# Patient Record
Sex: Female | Born: 1955
Health system: Southern US, Community
[De-identification: ages and names within clinical notes are randomized; demographics above are authoritative.]

## PROBLEM LIST (undated history)

## (undated) DIAGNOSIS — E119 Type 2 diabetes mellitus without complications: Secondary | ICD-10-CM

## (undated) DIAGNOSIS — E785 Hyperlipidemia, unspecified: Secondary | ICD-10-CM

## (undated) DIAGNOSIS — I10 Essential (primary) hypertension: Secondary | ICD-10-CM

## (undated) DIAGNOSIS — E669 Obesity, unspecified: Secondary | ICD-10-CM

## (undated) HISTORY — PX: ELBOW SURGERY: SHX618

## (undated) HISTORY — DX: Hyperlipidemia, unspecified: E78.5

## (undated) HISTORY — PX: HIP SURGERY: SHX245

## (undated) HISTORY — DX: Obesity, unspecified: E66.9

## (undated) HISTORY — DX: Essential (primary) hypertension: I10

## (undated) HISTORY — PX: ABDOMINAL HYSTERECTOMY: SHX81

## (undated) HISTORY — DX: Type 2 diabetes mellitus without complications: E11.9

---

## 2004-08-21 ENCOUNTER — Emergency Department: Payer: Self-pay | Admitting: Emergency Medicine

## 2004-09-05 ENCOUNTER — Other Ambulatory Visit: Payer: Self-pay

## 2004-09-13 ENCOUNTER — Ambulatory Visit: Payer: Self-pay | Admitting: Internal Medicine

## 2004-09-19 ENCOUNTER — Inpatient Hospital Stay: Payer: Self-pay | Admitting: Specialist

## 2004-10-26 ENCOUNTER — Encounter: Payer: Self-pay | Admitting: Specialist

## 2004-11-23 ENCOUNTER — Encounter: Payer: Self-pay | Admitting: Specialist

## 2007-10-24 LAB — HM COLONOSCOPY

## 2008-03-24 ENCOUNTER — Ambulatory Visit: Payer: Self-pay | Admitting: Obstetrics & Gynecology

## 2008-09-30 ENCOUNTER — Ambulatory Visit: Payer: Self-pay | Admitting: Unknown Physician Specialty

## 2009-01-06 ENCOUNTER — Ambulatory Visit: Payer: Self-pay | Admitting: Family Medicine

## 2009-06-22 ENCOUNTER — Ambulatory Visit: Payer: Self-pay | Admitting: Family Medicine

## 2010-10-14 ENCOUNTER — Ambulatory Visit: Payer: Self-pay | Admitting: Family Medicine

## 2010-11-07 DIAGNOSIS — J309 Allergic rhinitis, unspecified: Secondary | ICD-10-CM | POA: Insufficient documentation

## 2011-02-23 DIAGNOSIS — E11319 Type 2 diabetes mellitus with unspecified diabetic retinopathy without macular edema: Secondary | ICD-10-CM | POA: Insufficient documentation

## 2011-06-15 LAB — LIPID PANEL
CHOLESTEROL: 230 mg/dL — AB (ref 0–200)
HDL: 50 mg/dL (ref 35–70)
LDL Cholesterol: 157 mg/dL
TRIGLYCERIDES: 114 mg/dL (ref 40–160)

## 2011-08-25 ENCOUNTER — Ambulatory Visit: Payer: Self-pay | Admitting: Specialist

## 2011-08-25 DIAGNOSIS — E119 Type 2 diabetes mellitus without complications: Secondary | ICD-10-CM

## 2011-09-21 ENCOUNTER — Inpatient Hospital Stay: Payer: Self-pay | Admitting: Specialist

## 2012-04-12 ENCOUNTER — Ambulatory Visit: Payer: Self-pay | Admitting: Family Medicine

## 2013-05-28 ENCOUNTER — Ambulatory Visit: Payer: Self-pay | Admitting: Family Medicine

## 2014-12-16 ENCOUNTER — Ambulatory Visit: Payer: Self-pay | Admitting: Family Medicine

## 2015-04-23 LAB — HM DIABETES EYE EXAM

## 2015-05-12 ENCOUNTER — Encounter: Payer: Self-pay | Admitting: Family Medicine

## 2015-05-17 ENCOUNTER — Other Ambulatory Visit: Payer: Self-pay | Admitting: Family Medicine

## 2015-05-20 ENCOUNTER — Encounter: Payer: Self-pay | Admitting: Family Medicine

## 2015-05-20 ENCOUNTER — Ambulatory Visit (INDEPENDENT_AMBULATORY_CARE_PROVIDER_SITE_OTHER): Payer: 59 | Admitting: Family Medicine

## 2015-05-20 VITALS — BP 132/88 | HR 89 | Temp 97.7°F | Resp 16 | Ht 61.0 in | Wt 172.2 lb

## 2015-05-20 DIAGNOSIS — K635 Polyp of colon: Secondary | ICD-10-CM

## 2015-05-20 DIAGNOSIS — M19041 Primary osteoarthritis, right hand: Secondary | ICD-10-CM

## 2015-05-20 DIAGNOSIS — E785 Hyperlipidemia, unspecified: Secondary | ICD-10-CM | POA: Insufficient documentation

## 2015-05-20 DIAGNOSIS — E669 Obesity, unspecified: Secondary | ICD-10-CM

## 2015-05-20 DIAGNOSIS — Z1239 Encounter for other screening for malignant neoplasm of breast: Secondary | ICD-10-CM

## 2015-05-20 DIAGNOSIS — E1140 Type 2 diabetes mellitus with diabetic neuropathy, unspecified: Secondary | ICD-10-CM | POA: Diagnosis not present

## 2015-05-20 DIAGNOSIS — E66811 Obesity, class 1: Secondary | ICD-10-CM | POA: Insufficient documentation

## 2015-05-20 DIAGNOSIS — I10 Essential (primary) hypertension: Secondary | ICD-10-CM | POA: Diagnosis not present

## 2015-05-20 DIAGNOSIS — E1149 Type 2 diabetes mellitus with other diabetic neurological complication: Secondary | ICD-10-CM

## 2015-05-20 LAB — GLUCOSE, POCT (MANUAL RESULT ENTRY): POC Glucose: 140 mg/dl — AB (ref 70–99)

## 2015-05-20 LAB — POCT GLYCOSYLATED HEMOGLOBIN (HGB A1C): Hemoglobin A1C: 6.9

## 2015-05-20 MED ORDER — FENOFIBRATE MICRONIZED 134 MG PO CAPS: 134.0000 mg | ORAL_CAPSULE | Freq: Every day | ORAL | Status: DC

## 2015-05-20 MED ORDER — MELOXICAM 15 MG PO TABS: 15.0000 mg | ORAL_TABLET | Freq: Every day | ORAL | Status: DC

## 2015-05-20 MED ORDER — GLIMEPIRIDE 2 MG PO TABS: 2.0000 mg | ORAL_TABLET | Freq: Every day | ORAL | Status: DC

## 2015-05-20 MED ORDER — LISINOPRIL 5 MG PO TABS: 5.0000 mg | ORAL_TABLET | Freq: Every day | ORAL | Status: DC

## 2015-05-20 MED ORDER — WRIST/THUMB SPLINT/RIGHT MED MISC: 1.0000 | Status: DC

## 2015-05-20 NOTE — Patient Instructions (Signed)

## 2015-05-20 NOTE — Progress Notes (Signed)
Name: Brittany [name]   MRN: [id number]    DOB: 06-Sep-1956   Date:05/20/2015       Progress Note  Subjective  Chief Complaint  Chief Complaint  Patient presents with  . Hypertension  . Diabetes  . Hyperlipidemia  . Obesity    Hypertension This is a chronic problem. The current episode started more than 1 year ago. The problem is unchanged. The problem is controlled. Pertinent negatives include no blurred vision, chest pain, headaches, neck pain, orthopnea, palpitations or shortness of breath. There are no associated agents to hypertension. Risk factors for coronary artery disease include diabetes mellitus, dyslipidemia, sedentary lifestyle, stress, obesity and post-menopausal state. Past treatments include ACE inhibitors. The current treatment provides mild improvement. There are no compliance problems.   Diabetes She presents for her follow-up diabetic visit. She has type 2 diabetes mellitus. Her disease course has been improving. There are no hypoglycemic associated symptoms. Pertinent negatives for hypoglycemia include no dizziness, headaches, nervousness/anxiousness, seizures or tremors. Pertinent negatives for diabetes include no blurred vision, no chest pain, no weakness and no weight loss. Symptoms are improving. Diabetic complications include peripheral neuropathy. Risk factors for coronary artery disease include diabetes mellitus, dyslipidemia, hypertension, obesity, post-menopausal, sedentary lifestyle and stress. Current diabetic treatment includes oral agent (dual therapy). She is compliant with treatment most of the time. Her weight is increasing steadily. She is following a generally unhealthy diet. When asked about meal planning, she reported none. She rarely participates in exercise. Her overall blood glucose range is 130-140 mg/dl. An ACE inhibitor/angiotensin II receptor blocker is being taken.  Hyperlipidemia This is a chronic problem. The current episode started more than 1 year  ago. The problem is uncontrolled. Recent lipid tests were reviewed and are high. Exacerbating diseases include diabetes and obesity. There are no known factors aggravating her hyperlipidemia. Associated symptoms include myalgias. Pertinent negatives include no chest pain, focal weakness or shortness of breath. She is currently on no antihyperlipidemic treatment. The current treatment provides no improvement of lipids. Compliance problems include medication side effects.        Past Medical History  Diagnosis Date  . Diabetes mellitus without complication   . Hypertension   . Hyperlipidemia   . Obesity     Past Surgical History  Procedure Laterality Date  . Abdominal hysterectomy    . Hip surgery Right     twice  . Elbow surgery Left     Family History  Problem Relation Age of Onset  . Diabetes Mother   . Cancer Father   . Diabetes Sister   . Diabetes Brother     History   Social History  . Marital Status: Single    Spouse Name: N/A  . Number of Children: N/A  . Years of Education: N/A   Occupational History  . Not on file.   Social History Main Topics  . Smoking status: Never Smoker   . Smokeless tobacco: Not on file  . Alcohol Use: No  . Drug Use: No  . Sexual Activity: Not Currently   Other Topics Concern  . Not on file   Social History Narrative  . No narrative on file     Current outpatient prescriptions:  .  aspirin 81 MG tablet, Take 81 mg by mouth daily., Disp: , Rfl:  .  cetirizine (ZYRTEC) 10 MG tablet, Take 10 mg by mouth daily., Disp: , Rfl:  .  co-enzyme Q-10 30 MG capsule, Take 30 mg by mouth 3 (three) times  daily., Disp: , Rfl:  .  glimepiride (AMARYL) 2 MG tablet, TAKE 1 TABLET BY MOUTH ONCE DAILY, Disp: 90 tablet, Rfl: 1 .  lisinopril (PRINIVIL,ZESTRIL) 5 MG tablet, TAKE 1 TABLET EVERY DAY, Disp: 90 tablet, Rfl: 1 .  meloxicam (MOBIC) 15 MG tablet, TAKE 1 TABLET EVERY DAY, Disp: 90 tablet, Rfl: 1  No Known Allergies   Review of Systems   Constitutional: Negative for fever, chills and weight loss.  HENT: Negative for congestion, hearing loss, sore throat and tinnitus.   Eyes: Negative for blurred vision, double vision and redness.  Respiratory: Negative for cough, hemoptysis and shortness of breath.   Cardiovascular: Positive for leg swelling. Negative for chest pain, palpitations, orthopnea and claudication.  Gastrointestinal: Negative for heartburn, nausea, vomiting, diarrhea, constipation and blood in stool.  Genitourinary: Negative for dysuria, urgency, frequency and hematuria.  Musculoskeletal: Positive for myalgias and joint pain. Negative for back pain, falls and neck pain.  Skin: Negative for itching.  Neurological: Positive for tingling and sensory change. Negative for dizziness, tremors, focal weakness, seizures, loss of consciousness, weakness and headaches.  Endo/Heme/Allergies: Does not bruise/bleed easily.  Psychiatric/Behavioral: Negative for depression and substance abuse. The patient is not nervous/anxious and does not have insomnia.      Objective  Filed Vitals:   05/20/15 0913  BP: 132/88  Pulse: 89  Temp: 97.7 F (36.5 C)  Resp: 16  Height:  (1.549 m)  Weight: 172 lb 4 oz (78.132 kg)  SpO2: 96%    Physical Exam  Constitutional: She is oriented to person, place, and time and well-developed, well-nourished, and in no distress.  Obese  HENT:  Head: Normocephalic.  Eyes: EOM are normal. Pupils are equal, round, and reactive to light.  Neck: Normal range of motion. No thyromegaly present.  Cardiovascular: Normal rate, regular rhythm and normal heart sounds.   No murmur heard. Pulmonary/Chest: Effort normal and breath sounds normal.  Abdominal: Soft. Bowel sounds are normal.  Musculoskeletal: Normal range of motion. She exhibits no edema.  Neurological: She is alert and oriented to person, place, and time. No cranial nerve deficit. Gait normal.  Skin: Skin is warm and dry. No rash noted.   Psychiatric: Memory and affect normal.     Recent Results (from the past 2160 hour(s))  POCT Glucose (CBG)     Status: Abnormal   Collection Time: 05/20/15  9:20 AM  Result Value Ref Range   POC Glucose 140 (A) 70 - 99 mg/dl  POCT HgB Z6X     Status: None   Collection Time: 05/20/15  9:22 AM  Result Value Ref Range   Hemoglobin A1C 6.9      Assessment & Plan  1. Diabetes mellitus type 2 with neurological manifestations Offered but refused dietary consult - POCT HgB A1C - POCT Glucose (CBG) - glimepiride (AMARYL) 2 MG tablet; Take 1 tablet (2 mg total) by mouth daily.  Dispense: 90 tablet; Refill: 1  2. Essential hypertension Near goal - Comprehensive metabolic panel - TSH  3. Hyperlipemia Statin intolerant - fenofibrate micronized (LOFIBRA) 134 MG capsule; Take 1 capsule (134 mg total) by mouth daily before breakfast.  Dispense: 30 capsule; Refill: 3 - Lipid panel - lisinopril (PRINIVIL,ZESTRIL) 5 MG tablet; Take 1 tablet (5 mg total) by mouth daily.  Dispense: 90 tablet; Refill: 1  4. Obesity Dietary consult refused. Encourage exercise  5. Osteoarthritis of right hand, unspecified osteoarthritis type  - Elastic Bandages & Supports (WRIST/THUMB SPLINT/RIGHT MED) MISC; 1 Package by  Does not apply route as directed.  Dispense: 1 each; Refill: 0 - meloxicam (MOBIC) 15 MG tablet; Take 1 tablet (15 mg total) by mouth daily.  Dispense: 90 tablet; Refill: 1  6. Breast cancer screening  - MM Digital Diagnostic Bilat; Future  7. Colon polyps Due for repeat colonoscopy - Ambulatory referral to Gastroenterology

## 2015-05-21 DIAGNOSIS — M19049 Primary osteoarthritis, unspecified hand: Secondary | ICD-10-CM | POA: Insufficient documentation

## 2015-05-21 DIAGNOSIS — K635 Polyp of colon: Secondary | ICD-10-CM | POA: Insufficient documentation

## 2015-05-31 ENCOUNTER — Other Ambulatory Visit: Payer: Self-pay

## 2015-05-31 ENCOUNTER — Telehealth: Payer: Self-pay | Admitting: Gastroenterology

## 2015-05-31 NOTE — Telephone Encounter (Signed)
Gastroenterology Pre-Procedure Review  Request Date: 06-25-2015 Requesting Physician: Dr. Thana Ates  PATIENT REVIEW QUESTIONS: The patient responded to the following health history questions as indicated:    1. Are you having any GI issues? no 2. Do you have a personal history of Polyps? yes (polyps) 3. Do you have a family history of Colon Cancer or Polyps? no 4. Diabetes Mellitus? yes ( ) 5. Joint replacements in the past 12 months?no 6. Major health problems in the past 3 months?no 7. Any artificial heart valves, MVP, or defibrillator?no    MEDICATIONS & ALLERGIES:    Patient reports the following regarding taking any anticoagulation/antiplatelet therapy:   Plavix, Coumadin, Eliquis, Xarelto, Lovenox, Pradaxa, Brilinta, or Effient? no Aspirin? yes (DAily )  Patient confirms/reports the following medications:  Current Outpatient Prescriptions  Medication Sig Dispense Refill   aspirin 81 MG tablet Take 81 mg by mouth daily.     cetirizine (ZYRTEC) 10 MG tablet Take 10 mg by mouth daily.     co-enzyme Q-10 30 MG capsule Take 30 mg by mouth 3 (three) times daily.     Elastic Bandages & Supports (WRIST/THUMB SPLINT/RIGHT MED) MISC 1 Package by Does not apply route as directed. 1 each 0   fenofibrate micronized (LOFIBRA) 134 MG capsule Take 1 capsule (134 mg total) by mouth daily before breakfast. 30 capsule 3   glimepiride (AMARYL) 2 MG tablet Take 1 tablet (2 mg total) by mouth daily. 90 tablet 1   lisinopril (PRINIVIL,ZESTRIL) 5 MG tablet Take 1 tablet (5 mg total) by mouth daily. 90 tablet 1   meloxicam (MOBIC) 15 MG tablet Take 1 tablet (15 mg total) by mouth daily. 90 tablet 1   No current facility-administered medications for this visit.    Patient confirms/reports the following allergies:  No Known Allergies  No orders of the defined types were placed in this encounter.    AUTHORIZATION INFORMATION Primary Insurance: 1D#: Group #:  Secondary  Insurance: 1D#: Group #:  SCHEDULE INFORMATION: Date: 06-25-2015 Time: Location:[facility]

## 2015-06-16 ENCOUNTER — Ambulatory Visit (INDEPENDENT_AMBULATORY_CARE_PROVIDER_SITE_OTHER): Payer: 59 | Admitting: Family Medicine

## 2015-06-16 ENCOUNTER — Encounter: Payer: Self-pay | Admitting: Family Medicine

## 2015-06-16 VITALS — BP 120/80 | HR 103 | Temp 98.4°F | Resp 20 | Ht 61.0 in | Wt 173.1 lb

## 2015-06-16 DIAGNOSIS — M79605 Pain in left leg: Secondary | ICD-10-CM

## 2015-06-16 DIAGNOSIS — M79604 Pain in right leg: Secondary | ICD-10-CM | POA: Diagnosis not present

## 2015-06-16 MED ORDER — HYDROCODONE-ACETAMINOPHEN 5-325 MG PO TABS: 1.0000 | ORAL_TABLET | Freq: Three times a day (TID) | ORAL | Status: DC | PRN

## 2015-06-16 NOTE — Progress Notes (Signed)
Name: Brittany [name]   MRN: [id number]    DOB: [birth year]   Date:06/16/2015      Subjective  Chief Complaint  Chief Complaint  Patient presents with  . Acute Visit    Leg pain x2 months (Dr. Rutherford Nail Pt)     HPI  Pt. Presents with 2 month history of worsening bilateral posterior leg pain. Most recent episode was yesterday when she was loading a box in the truck at work and felt sharp pain and as if something was pulling at her muscles in the thigh. She fell down yesterday in her kitchen when her legs 'gave out' pain is worse on Tuesdays when she has to unload and load the truck. No numbness/tingling/weakness of her legs at present. Pt. Is s/p right THR x 2.  Past Medical History  Diagnosis Date  . Diabetes mellitus without complication   . Hypertension   . Hyperlipidemia   . Obesity     Past Surgical History  Procedure Laterality Date  . Abdominal hysterectomy    . Hip surgery Right     twice  . Elbow surgery Left     Family History  Problem Relation Age of Onset  . Diabetes Mother   . Cancer Father   . Diabetes Sister   . Diabetes Brother     Social History   Social History  . Marital Status: Single    Spouse Name: N/A  . Number of Children: N/A  . Years of Education: N/A   Occupational History  . Not on file.   Social History Main Topics  . Smoking status: Never Smoker   . Smokeless tobacco: Not on file  . Alcohol Use: No  . Drug Use: No  . Sexual Activity: Not Currently   Other Topics Concern  . Not on file   Social History Narrative     Current outpatient prescriptions:  .  aspirin 81 MG tablet, Take 81 mg by mouth daily., Disp: , Rfl:  .  cetirizine (ZYRTEC) 10 MG tablet, Take 10 mg by mouth daily., Disp: , Rfl:  .  co-enzyme Q-10 30 MG capsule, Take 30 mg by mouth 3 (three) times daily., Disp: , Rfl:  .  Elastic Bandages & Supports (WRIST/THUMB SPLINT/RIGHT MED) MISC, 1 Package by Does not apply route as directed., Disp: 1 each, Rfl: 0 .   fenofibrate micronized (LOFIBRA) 134 MG capsule, Take 1 capsule (134 mg total) by mouth daily before breakfast., Disp: 30 capsule, Rfl: 3 .  glimepiride (AMARYL) 2 MG tablet, Take 1 tablet (2 mg total) by mouth daily., Disp: 90 tablet, Rfl: 1 .  lisinopril (PRINIVIL,ZESTRIL) 5 MG tablet, Take 1 tablet (5 mg total) by mouth daily., Disp: 90 tablet, Rfl: 1 .  meloxicam (MOBIC) 15 MG tablet, Take 1 tablet (15 mg total) by mouth daily., Disp: 90 tablet, Rfl: 1 .  SUPREP BOWEL PREP SOLN, See admin instructions., Disp: , Rfl: 0  No Known Allergies   ROS  Constitutional: Negative for fever and chills.  Respiratory: Negative for cough and shortness of breath.   Cardiovascular: Negative for chest pain.  Musculoskeletal: Positive for myalgias, back pain and falls. Negative for joint pain.    Objective  Filed Vitals:   06/16/15 1448  BP: 120/80  Pulse: 103  Temp: 98.4 F (36.9 C)  TempSrc: Oral  Resp: 20  Height: 5' 1" (1.549 m)  Weight: 173 lb 1.6 oz (78.518 kg)  SpO2: 96%    Physical Exam  Musculoskeletal:  Right upper leg: She exhibits no tenderness, no swelling and no edema.       Left upper leg: She exhibits no tenderness, no bony tenderness and no swelling.  Tenderness over the right and left trochanteric bursa   Constitutional: She is oriented to person, place, and time and well-developed, well-nourished, and in no distress.  Cardiovascular: Normal rate and regular rhythm.   Pulmonary/Chest: Effort normal and breath sounds normal.  Neurological: She is alert and oriented to person, place, and time.  Skin: Skin is warm and dry.  Nursing note and vitals reviewed.   Assessment & Plan  1. Leg pain, bilateral Obtain labs and imaging for worsening bilateral leg pain.  Suspect trochanteric bursitis refractory to NSAID therapy. Patient advised to contact her orthopedist for consideration of steroid injection. Rx for Hydrocodone 5 mg every 8 hours as needed for 5 days is  provided.  - CK (Creatine Kinase) - Aldolase - CBC w/Diff/Platelet - Comprehensive metabolic panel - Sed Rate (ESR) - DG FEMUR MIN 2 VIEWS LEFT; Future - DG FEMUR, MIN 2 VIEWS RIGHT; Future - DG HIPS BILAT WITH PELVIS 3-4 VIEWS; Future  - HYDROcodone-acetaminophen (NORCO/VICODIN) 5-325 MG per tablet; Take 1 tablet by mouth every 8 (eight) hours as needed for moderate pain.  Dispense: 15 tablet; Refill: 0   Tkeya Stencil [facility] 06/16/2015 6:10 PM

## 2015-06-17 ENCOUNTER — Encounter: Payer: Self-pay | Admitting: *Deleted

## 2015-06-18 ENCOUNTER — Encounter: Payer: Self-pay | Admitting: Anesthesiology

## 2015-06-18 LAB — COMPREHENSIVE METABOLIC PANEL
ALBUMIN: 4.4 g/dL (ref 3.5–5.5)
ALK PHOS: 64 IU/L (ref 39–117)
ALT: 21 IU/L (ref 0–32)
AST: 17 IU/L (ref 0–40)
Albumin/Globulin Ratio: 1.7 (ref 1.1–2.5)
BILIRUBIN TOTAL: 0.5 mg/dL (ref 0.0–1.2)
BUN / CREAT RATIO: 33 — AB (ref 9–23)
BUN: 23 mg/dL (ref 6–24)
CHLORIDE: 103 mmol/L (ref 97–108)
CO2: 22 mmol/L (ref 18–29)
CREATININE: 0.7 mg/dL (ref 0.57–1.00)
Calcium: 9.4 mg/dL (ref 8.7–10.2)
GFR calc Af Amer: 110 mL/min/{1.73_m2} (ref >59)
GFR calc non Af Amer: 96 mL/min/{1.73_m2} (ref >59)
GLUCOSE: 122 mg/dL — AB (ref 65–99)
Globulin, Total: 2.6 g/dL (ref 1.5–4.5)
Potassium: 4.6 mmol/L (ref 3.5–5.2)
Sodium: 142 mmol/L (ref 134–144)
Total Protein: 7 g/dL (ref 6.0–8.5)

## 2015-06-18 LAB — CBC WITH DIFFERENTIAL/PLATELET
BASOS ABS: 0 10*3/uL (ref 0.0–0.2)
Basos: 1 %
EOS (ABSOLUTE): 0.2 10*3/uL (ref 0.0–0.4)
Eos: 4 %
HEMOGLOBIN: 14.5 g/dL (ref 11.1–15.9)
Hematocrit: 43.5 % (ref 34.0–46.6)
IMMATURE GRANS (ABS): 0 10*3/uL (ref 0.0–0.1)
Immature Granulocytes: 0 %
LYMPHS ABS: 2 10*3/uL (ref 0.7–3.1)
LYMPHS: 35 %
MCH: 29.4 pg (ref 26.6–33.0)
MCHC: 33.3 g/dL (ref 31.5–35.7)
MCV: 88 fL (ref 79–97)
MONOCYTES: 10 %
Monocytes Absolute: 0.6 10*3/uL (ref 0.1–0.9)
Neutrophils Absolute: 2.9 10*3/uL (ref 1.4–7.0)
Neutrophils: 50 %
PLATELETS: 247 10*3/uL (ref 150–379)
RBC: 4.93 x10E6/uL (ref 3.77–5.28)
RDW: 13.9 % (ref 12.3–15.4)
WBC: 5.8 10*3/uL (ref 3.4–10.8)

## 2015-06-18 LAB — SEDIMENTATION RATE: SED RATE: 6 mm/h (ref 0–40)

## 2015-06-18 LAB — CK: CK TOTAL: 67 U/L (ref 24–173)

## 2015-06-18 LAB — ALDOLASE: ALDOLASE: 4.3 U/L (ref 3.3–10.3)

## 2015-06-23 NOTE — Discharge Instructions (Signed)

## 2015-06-25 ENCOUNTER — Ambulatory Visit: Payer: 59 | Admitting: Anesthesiology

## 2015-06-25 ENCOUNTER — Ambulatory Visit
Admission: RE | Admit: 2015-06-25 | Discharge: 2015-06-25 | Disposition: A | Discharge status: Home / Self Care | Payer: 59 | Source: Ambulatory Visit | Attending: Gastroenterology | Admitting: Gastroenterology

## 2015-06-25 ENCOUNTER — Encounter
Admission: RE | Disposition: A | Discharge status: Home / Self Care | Payer: Self-pay | Source: Ambulatory Visit | Attending: Gastroenterology

## 2015-06-25 DIAGNOSIS — E119 Type 2 diabetes mellitus without complications: Secondary | ICD-10-CM | POA: Diagnosis not present

## 2015-06-25 DIAGNOSIS — I10 Essential (primary) hypertension: Secondary | ICD-10-CM | POA: Insufficient documentation

## 2015-06-25 DIAGNOSIS — Z7982 Long term (current) use of aspirin: Secondary | ICD-10-CM | POA: Insufficient documentation

## 2015-06-25 DIAGNOSIS — Z6831 Body mass index (BMI) 31.0-31.9, adult: Secondary | ICD-10-CM | POA: Diagnosis not present

## 2015-06-25 DIAGNOSIS — E785 Hyperlipidemia, unspecified: Secondary | ICD-10-CM | POA: Diagnosis not present

## 2015-06-25 DIAGNOSIS — Z79899 Other long term (current) drug therapy: Secondary | ICD-10-CM | POA: Insufficient documentation

## 2015-06-25 DIAGNOSIS — Z860100 Personal history of colon polyps, unspecified: Secondary | ICD-10-CM | POA: Insufficient documentation

## 2015-06-25 DIAGNOSIS — E669 Obesity, unspecified: Secondary | ICD-10-CM | POA: Insufficient documentation

## 2015-06-25 DIAGNOSIS — Z833 Family history of diabetes mellitus: Secondary | ICD-10-CM | POA: Diagnosis not present

## 2015-06-25 DIAGNOSIS — Z90710 Acquired absence of both cervix and uterus: Secondary | ICD-10-CM | POA: Diagnosis not present

## 2015-06-25 DIAGNOSIS — Z809 Family history of malignant neoplasm, unspecified: Secondary | ICD-10-CM | POA: Diagnosis not present

## 2015-06-25 DIAGNOSIS — K640 First degree hemorrhoids: Secondary | ICD-10-CM | POA: Insufficient documentation

## 2015-06-25 HISTORY — PX: COLONOSCOPY WITH PROPOFOL: SHX5780

## 2015-06-25 LAB — GLUCOSE, CAPILLARY: Glucose-Capillary: 185 mg/dL — ABNORMAL HIGH (ref 65–99)

## 2015-06-25 SURGERY — COLONOSCOPY WITH PROPOFOL
Anesthesia: Monitor Anesthesia Care | Wound class: Contaminated

## 2015-06-25 MED ORDER — SODIUM CHLORIDE 0.9 % IV SOLN: INTRAVENOUS | Status: DC

## 2015-06-25 MED ORDER — LIDOCAINE HCL (CARDIAC) 20 MG/ML IV SOLN
INTRAVENOUS | Status: DC | PRN
  Administered 2015-06-25: 30 mg via INTRAVENOUS

## 2015-06-25 MED ORDER — LACTATED RINGERS IV SOLN
INTRAVENOUS | Status: DC
  Administered 2015-06-25 (×2): via INTRAVENOUS

## 2015-06-25 MED ORDER — STERILE WATER FOR IRRIGATION IR SOLN
Status: DC | PRN
  Administered 2015-06-25: 08:00:00

## 2015-06-25 MED ORDER — PROPOFOL 10 MG/ML IV BOLUS
INTRAVENOUS | Status: DC | PRN
  Administered 2015-06-25: 10 mg via INTRAVENOUS
  Administered 2015-06-25: 50 mg via INTRAVENOUS
  Administered 2015-06-25: 10 mg via INTRAVENOUS
  Administered 2015-06-25 (×2): 20 mg via INTRAVENOUS
  Administered 2015-06-25: 100 mg via INTRAVENOUS
  Administered 2015-06-25: 40 mg via INTRAVENOUS

## 2015-06-25 SURGICAL SUPPLY — 28 items

## 2015-06-25 NOTE — Op Note (Signed)
[facility] Patient Name: Maylen Waltermire Procedure Date: 06/25/2015 8:06 AM MRN: [id number] Account #: [id number] Date of Birth: 06/17/1956 Admit Type: Outpatient Age: 59 Room: [facility] OR ROOM 01 Gender: Female Note Status: Finalized Procedure:         Colonoscopy Indications:       High risk colon cancer surveillance: Personal history of                     colonic polyps Providers:         Midge Minium, MD Referring MD:      Dennison Mascot, MD (Referring MD) Medicines:         Propofol per Anesthesia Complications:     No immediate complications. Procedure:         Pre-Anesthesia Assessment:                    - Prior to the procedure, a History and Physical was                     performed, and patient medications and allergies were                     reviewed. The patient's tolerance of previous anesthesia                     was also reviewed. The risks and benefits of the procedure                     and the sedation options and risks were discussed with the                     patient. All questions were answered, and informed consent                     was obtained. Prior Anticoagulants: The patient has taken                     no previous anticoagulant or antiplatelet agents. ASA                     Grade Assessment: II - A patient with mild systemic                     disease. After reviewing the risks and benefits, the                     patient was deemed in satisfactory condition to undergo                     the procedure.                    After obtaining informed consent, the colonoscope was                     passed under direct vision. Throughout the procedure, the                     patient's blood pressure, pulse, and oxygen saturations                     were monitored continuously. The Olympus CF H180AL  colonoscope (S#: [id number]) was introduced through the anus                     and advanced to the  the cecum, identified by appendiceal                     orifice and ileocecal valve. The colonoscopy was performed                     without difficulty. The patient tolerated the procedure                     well. The quality of the bowel preparation was excellent. Findings:      The perianal and digital rectal examinations were normal.      Non-bleeding internal hemorrhoids were found during retroflexion. The       hemorrhoids were Grade I (internal hemorrhoids that do not prolapse). Impression:        - Non-bleeding internal hemorrhoids.                    - No specimens collected. Recommendation:    - Repeat colonoscopy in 5 years for surveillance. Procedure Code(s): --- Professional ---                    [phone], Colonoscopy, flexible; diagnostic, including                     collection of specimen(s) by brushing or washing, when                     performed (separate procedure) Diagnosis Code(s): --- Professional ---                    Z86.010, Personal history of colonic polyps CPT copyright 2014 American Medical Association. All rights reserved. The codes documented in this report are preliminary and upon coder review may  be revised to meet current compliance requirements. Midge Minium, MD 06/25/2015 8:27:47 AM This report has been signed electronically. Number of Addenda: 0 Note Initiated On: 06/25/2015 8:06 AM Scope Withdrawal Time: 0 hours 6 minutes 18 seconds  Total Procedure Duration: 0 hours 9 minutes 19 seconds       [facility]

## 2015-06-25 NOTE — Anesthesia Preprocedure Evaluation (Signed)
Anesthesia Evaluation  Patient identified by MRN, date of birth, ID band Patient awake    Reviewed: Allergy & Precautions, NPO status , Patient's Chart, lab work & pertinent test results  Airway Mallampati: II  TM Distance: >3 FB Neck ROM: Full    Dental no notable dental hx.    Pulmonary neg pulmonary ROS,  breath sounds clear to auscultation  Pulmonary exam normal       Cardiovascular hypertension, negative cardio ROS Normal cardiovascular examRhythm:Regular Rate:Normal     Neuro/Psych negative neurological ROS  negative psych ROS   GI/Hepatic negative GI ROS, Neg liver ROS,   Endo/Other  negative endocrine ROSdiabetes  Renal/GU negative Renal ROS  negative genitourinary   Musculoskeletal negative musculoskeletal ROS (+)   Abdominal   Peds negative pediatric ROS (+)  Hematology negative hematology ROS (+)   Anesthesia Other Findings   Reproductive/Obstetrics negative OB ROS                             Anesthesia Physical Anesthesia Plan  ASA: II  Anesthesia Plan: MAC   Post-op Pain Management:    Induction: Intravenous  Airway Management Planned:   Additional Equipment:   Intra-op Plan:   Post-operative Plan: Extubation in OR  Informed Consent: I have reviewed the patients History and Physical, chart, labs and discussed the procedure including the risks, benefits and alternatives for the proposed anesthesia with the patient or authorized representative who has indicated his/her understanding and acceptance.   Dental advisory given  Plan Discussed with: CRNA  Anesthesia Plan Comments:         Anesthesia Quick Evaluation

## 2015-06-25 NOTE — Transfer of Care (Signed)
Immediate Anesthesia Transfer of Care Note  Patient: Brittany [name]  Procedure(s) Performed: Procedure(s) with comments: COLONOSCOPY WITH PROPOFOL (N/A) - Diabetic - oral meds  Patient Location: PACU  Anesthesia Type: MAC  Level of Consciousness: awake, alert  and patient cooperative  Airway and Oxygen Therapy: Patient Spontanous Breathing and Patient connected to supplemental oxygen  Post-op Assessment: Post-op Vital signs reviewed, Patient's Cardiovascular Status Stable, Respiratory Function Stable, Patent Airway and No signs of Nausea or vomiting  Post-op Vital Signs: Reviewed and stable  Complications: No apparent anesthesia complications

## 2015-06-25 NOTE — H&P (Signed)
[facility]  [address] [place], [place] [address] Phone: [phone] Fax : [phone]  Primary Care Physician:  Dennison Mascot, MD Primary Gastroenterologist:  Dr. Servando Snare  Pre-Procedure History & Physical: HPI:  Brittany [name] is a 59 y.o. female is here for an colonoscopy.   Past Medical History  Diagnosis Date  . Diabetes mellitus without complication   . Hyperlipidemia   . Obesity   . Hypertension     pt states on medication as prevenative    Past Surgical History  Procedure Laterality Date  . Abdominal hysterectomy    . Hip surgery Right     twice  . Elbow surgery Left     Prior to Admission medications   Medication Sig Start Date End Date Taking? Authorizing Provider  aspirin 81 MG tablet Take 81 mg by mouth daily.   Yes Historical Provider, MD  cetirizine (ZYRTEC) 10 MG tablet Take 10 mg by mouth daily.   Yes Historical Provider, MD  co-enzyme Q-10 30 MG capsule Take 30 mg by mouth at bedtime.    Yes Historical Provider, MD  fenofibrate micronized (LOFIBRA) 134 MG capsule Take 1 capsule (134 mg total) by mouth daily before breakfast. 05/20/15  Yes Dennison Mascot, MD  glimepiride (AMARYL) 2 MG tablet Take 1 tablet (2 mg total) by mouth daily. 05/20/15  Yes Dennison Mascot, MD  HYDROcodone-acetaminophen (NORCO/VICODIN) 5-325 MG per tablet Take 1 tablet by mouth every 8 (eight) hours as needed for moderate pain. 06/16/15  Yes Ellyn Hack, MD  lisinopril (PRINIVIL,ZESTRIL) 5 MG tablet Take 1 tablet (5 mg total) by mouth daily. 05/20/15  Yes Dennison Mascot, MD  meloxicam (MOBIC) 15 MG tablet Take 1 tablet (15 mg total) by mouth daily. 05/20/15  Yes Dennison Mascot, MD  SUPREP BOWEL PREP SOLN See admin instructions. 06/08/15  Yes Historical Provider, MD  Elastic Bandages & Supports (WRIST/THUMB SPLINT/RIGHT MED) MISC 1 Package by Does not apply route as directed. 05/20/15   Dennison Mascot, MD    Allergies as of 05/31/2015  . (No Known  Allergies)    Family History  Problem Relation Age of Onset  . Diabetes Mother   . Cancer Father   . Diabetes Sister   . Diabetes Brother     Social History   Social History  . Marital Status: Single    Spouse Name: N/A  . Number of Children: N/A  . Years of Education: N/A   Occupational History  . Not on file.   Social History Main Topics  . Smoking status: Never Smoker   . Smokeless tobacco: Never Used  . Alcohol Use: 0.0 oz/week    0 Standard drinks or equivalent per week     Comment: 3 times a year  . Drug Use: No  . Sexual Activity: Not Currently   Other Topics Concern  . Not on file   Social History Narrative    Review of Systems: See HPI, otherwise negative ROS  Physical Exam: BP 120/86 mmHg  Pulse 80  Temp(Src) 97 F (36.1 C) (Temporal)  Resp 16  Ht  (1.549 m)  Wt 166 lb (75.297 kg)  BMI 31.38 kg/m2  SpO2 98% General:   Alert,  pleasant and cooperative in NAD Head:  Normocephalic and atraumatic. Neck:  Supple; no masses or thyromegaly. Lungs:  Clear throughout to auscultation.    Heart:  Regular rate and rhythm. Abdomen:  Soft, nontender and nondistended. Normal bowel sounds, without guarding, and without rebound.  Neurologic:  Alert and  oriented x4;  grossly normal neurologically.  Impression/Plan: Brittany [name] is here for an colonoscopy to be performed for history of polyps  Risks, benefits, limitations, and alternatives regarding  colonoscopy have been reviewed with the patient.  Questions have been answered.  All parties agreeable.   Darlina Rumpf, MD  06/25/2015, 8:06 AM

## 2015-06-25 NOTE — Anesthesia Postprocedure Evaluation (Signed)
  Anesthesia Post-op Note  Patient: Brittany [name]  Procedure(s) Performed: Procedure(s) with comments: COLONOSCOPY WITH PROPOFOL (N/A) - Diabetic - oral meds  Anesthesia type:MAC  Patient location: PACU  Post pain: Pain level controlled  Post assessment: Post-op Vital signs reviewed, Patient's Cardiovascular Status Stable, Respiratory Function Stable, Patent Airway and No signs of Nausea or vomiting  Post vital signs: Reviewed and stable  Last Vitals:  Filed Vitals:   06/25/15 0849  BP:   Pulse: 80  Temp:   Resp: 13    Level of consciousness: awake, alert  and patient cooperative  Complications: No apparent anesthesia complications

## 2015-06-25 NOTE — Anesthesia Procedure Notes (Signed)
Procedure Name: MAC Performed by: Emiliya Chretien Pre-anesthesia Checklist: Patient identified, Emergency Drugs available, Suction available, Patient being monitored and Timeout performed Patient Re-evaluated:Patient Re-evaluated prior to inductionOxygen Delivery Method: Nasal cannula Preoxygenation: Pre-oxygenation with 100% oxygen       

## 2015-06-29 ENCOUNTER — Encounter: Payer: Self-pay | Admitting: Gastroenterology

## 2015-07-23 ENCOUNTER — Other Ambulatory Visit: Payer: Self-pay

## 2015-07-23 DIAGNOSIS — E785 Hyperlipidemia, unspecified: Secondary | ICD-10-CM

## 2015-07-23 MED ORDER — FENOFIBRATE MICRONIZED 134 MG PO CAPS: 134.0000 mg | ORAL_CAPSULE | Freq: Every day | ORAL | Status: DC

## 2015-10-28 ENCOUNTER — Other Ambulatory Visit: Payer: Self-pay | Admitting: Family Medicine

## 2015-12-02 ENCOUNTER — Ambulatory Visit (INDEPENDENT_AMBULATORY_CARE_PROVIDER_SITE_OTHER): Payer: 59 | Admitting: Family Medicine

## 2015-12-02 ENCOUNTER — Encounter: Payer: Self-pay | Admitting: Family Medicine

## 2015-12-02 VITALS — BP 130/74 | HR 86 | Temp 98.1°F | Resp 14 | Ht 61.0 in | Wt 175.7 lb

## 2015-12-02 DIAGNOSIS — M6530 Trigger finger, unspecified finger: Secondary | ICD-10-CM | POA: Insufficient documentation

## 2015-12-02 DIAGNOSIS — G629 Polyneuropathy, unspecified: Secondary | ICD-10-CM | POA: Insufficient documentation

## 2015-12-02 DIAGNOSIS — Z23 Encounter for immunization: Secondary | ICD-10-CM | POA: Diagnosis not present

## 2015-12-02 DIAGNOSIS — Z1239 Encounter for other screening for malignant neoplasm of breast: Secondary | ICD-10-CM | POA: Diagnosis not present

## 2015-12-02 DIAGNOSIS — M1990 Unspecified osteoarthritis, unspecified site: Secondary | ICD-10-CM | POA: Insufficient documentation

## 2015-12-02 DIAGNOSIS — M19041 Primary osteoarthritis, right hand: Secondary | ICD-10-CM

## 2015-12-02 DIAGNOSIS — E785 Hyperlipidemia, unspecified: Secondary | ICD-10-CM

## 2015-12-02 DIAGNOSIS — G64 Other disorders of peripheral nervous system: Secondary | ICD-10-CM | POA: Insufficient documentation

## 2015-12-02 DIAGNOSIS — K219 Gastro-esophageal reflux disease without esophagitis: Secondary | ICD-10-CM | POA: Insufficient documentation

## 2015-12-02 DIAGNOSIS — E1140 Type 2 diabetes mellitus with diabetic neuropathy, unspecified: Secondary | ICD-10-CM | POA: Diagnosis not present

## 2015-12-02 DIAGNOSIS — K2100 Gastro-esophageal reflux disease with esophagitis, without bleeding: Secondary | ICD-10-CM

## 2015-12-02 DIAGNOSIS — I10 Essential (primary) hypertension: Secondary | ICD-10-CM

## 2015-12-02 LAB — POCT GLYCOSYLATED HEMOGLOBIN (HGB A1C): HEMOGLOBIN A1C: 8.2

## 2015-12-02 LAB — POCT UA - MICROALBUMIN: Microalbumin Ur, POC: NEGATIVE mg/L

## 2015-12-02 MED ORDER — MELOXICAM 15 MG PO TABS: 15.0000 mg | ORAL_TABLET | Freq: Every day | ORAL | Status: DC

## 2015-12-02 MED ORDER — OMEPRAZOLE 40 MG PO CPDR: 40.0000 mg | DELAYED_RELEASE_CAPSULE | Freq: Every day | ORAL | Status: DC

## 2015-12-02 MED ORDER — FENOFIBRATE MICRONIZED 134 MG PO CAPS: ORAL_CAPSULE | ORAL | Status: DC

## 2015-12-02 MED ORDER — GLIPIZIDE ER 5 MG PO TB24: 5.0000 mg | ORAL_TABLET | Freq: Every day | ORAL | Status: DC

## 2015-12-02 MED ORDER — LISINOPRIL 5 MG PO TABS: 5.0000 mg | ORAL_TABLET | Freq: Every day | ORAL | Status: DC

## 2015-12-02 NOTE — Progress Notes (Signed)
Name: Brittany [name]   MRN: [id number]    DOB: 01/21/1956   Date:12/02/2015       Progress Note  Subjective  Chief Complaint  Chief Complaint  Patient presents with  . Medication Refill  . Diabetes    Does not check sugar at home.  Marland Kitchen Hypertension  . Gastroesophageal Reflux    Trouble with spicy foods    HPI  DMII: she has diabetic neuropathy - with pain on both legs  - she tried gabapentin but it did not help. She is currently taking Amaryl  she has not been checking fsbs at home and has not been compliant with her diet lately. She has noticed some blurred vision but no polydipsia, polyuria or polyphagia. She states last report from her eye doctor was normal but she has a history of right diabetic retinopathy. She takes an aspirin daily and lisinopril for kidney protection  Hyperlipidemia: she has been taking Fenofibrate, she states statin therapy causes myalgias. No chest pain.  HTN: she has not been checking her bp at home, it is at goal today, no side effects of medication, no chest pain or palpitation, no SOB  GERD: taking Omeprazole daily, symptoms usually triggered by spicy food. Sometimes she is able to skip a few doses.   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   Patient Active Problem List   Diagnosis Date Noted  . Acquired trigger finger 12/02/2015  . Arthritis, degenerative 12/02/2015  . Disorder of peripheral nervous system (HCC) 12/02/2015  . Reflux esophagitis 12/02/2015  . Diabetic neuropathy, type II diabetes mellitus (HCC) 12/02/2015  . Hx of colonic polyps   . Degenerative joint disease of hand 05/21/2015  . Colon polyps 05/21/2015  . Essential hypertension 05/20/2015  . Hyperlipemia 05/20/2015  . Obesity 05/20/2015  . Diabetic retinopathy associated with type 2 diabetes mellitus (HCC) 02/23/2011  . Allergic rhinitis 11/07/2010    Past Surgical History   Procedure Laterality Date  . Abdominal hysterectomy    . Hip surgery Right     twice  . Elbow surgery Left   . Colonoscopy with propofol N/A 06/25/2015    Procedure: COLONOSCOPY WITH PROPOFOL;  Surgeon: Midge Minium, MD;  Location: [facility];  Service: Endoscopy;  Laterality: N/A;  Diabetic - oral meds    Family History  Problem Relation Age of Onset  . Diabetes Mother   . Cancer Father   . Diabetes Sister   . Diabetes Brother     Social History   Social History  . Marital Status: Single    Spouse Name: N/A  . Number of Children: N/A  . Years of Education: N/A   Occupational History  . Not on file.   Social History Main Topics  . Smoking status: Never Smoker   . Smokeless tobacco: Never Used  . Alcohol Use: 0.0 oz/week    0 Standard drinks or equivalent per week     Comment: 1 times a year-wine  . Drug Use: No  . Sexual Activity: Not Currently   Other Topics Concern  . Not on file   Social History Narrative     Current outpatient prescriptions:  .  aspirin 81 MG tablet, Take 81 mg by mouth daily., Disp: , Rfl:  .  cetirizine (ZYRTEC) 10 MG tablet, Take 10 mg by mouth daily., Disp: , Rfl:  .  co-enzyme Q-10  30 MG capsule, Take 30 mg by mouth at bedtime. , Disp: , Rfl:  .  fenofibrate micronized (LOFIBRA) 134 MG capsule, TAKE 1 CAPSULE (134 MG TOTAL) BY MOUTH DAILY BEFORE BREAKFAST., Disp: 90 capsule, Rfl: 1 .  GLUCOSE BLOOD VI, , Disp: , Rfl:  .  lisinopril (PRINIVIL,ZESTRIL) 5 MG tablet, Take 1 tablet (5 mg total) by mouth daily., Disp: 90 tablet, Rfl: 1 .  meloxicam (MOBIC) 15 MG tablet, Take 1 tablet (15 mg total) by mouth daily., Disp: 90 tablet, Rfl: 1 .  omeprazole (PRILOSEC) 40 MG capsule, Take 1 capsule (40 mg total) by mouth daily., Disp: 90 capsule, Rfl: 1 .  glipiZIDE (GLUCOTROL XL) 5 MG 24 hr tablet, Take 1 tablet (5 mg total) by mouth daily with breakfast., Disp: 90 tablet, Rfl: 0  Allergies  Allergen Reactions  . Atorvastatin     myalgia   . Metformin & Diet Manage Prod   . Rosuvastatin     MUSCLE ACHES     ROS  Constitutional: Negative for fever or weight change.  Respiratory: Negative for cough and shortness of breath.   Cardiovascular: Negative for chest pain or palpitations.  Gastrointestinal: Negative for abdominal pain, no bowel changes.  Musculoskeletal: Negative for gait problem or joint swelling. She has left middle finger that triggers Skin: Negative for rash.  Neurological: Negative for dizziness or headache.  No other specific complaints in a complete review of systems (except as listed in HPI above).  Objective  Filed Vitals:   12/02/15 0845  BP: 130/74  Pulse: 86  Temp: 98.1 F (36.7 C)  TempSrc: Oral  Resp: 14  Height:  (1.549 m)  Weight: 175 lb 11.2 oz (79.697 kg)  SpO2: 97%    Body mass index is 33.22 kg/(m^2).  Physical Exam  Constitutional: Patient appears well-developed and well-nourished. Obese No distress.  HEENT: head atraumatic, normocephalic, pupils equal and reactive to light,  neck supple, throat within normal limits Cardiovascular: Normal rate, regular rhythm and normal heart sounds.  No murmur heard. No BLE edema. Pulmonary/Chest: Effort normal and breath sounds normal. No respiratory distress. Abdominal: Soft.  There is no tenderness. Psychiatric: Patient has a normal mood and affect. behavior is normal. Judgment and thought content normal.  Recent Results (from the past 2160 hour(s))  POCT HgB A1C     Status: Abnormal   Collection Time: 12/02/15  8:48 AM  Result Value Ref Range   Hemoglobin A1C 8.2   POCT UA - Microalbumin     Status: Normal   Collection Time: 12/02/15  8:48 AM  Result Value Ref Range   Microalbumin Ur, POC negative mg/L   Creatinine, POC  mg/dL   Albumin/Creatinine Ratio, Urine, POC       PHQ2/9: Depression screen [facility] 2/9 12/02/2015 06/16/2015 05/20/2015  Decreased Interest 0 0 0  Down, Depressed, Hopeless 0 0 0  PHQ - 2 Score 0 0 0      Fall Risk: Fall Risk  12/02/2015 06/16/2015 05/20/2015  Falls in the past year? Yes No No  Number falls in past yr: 2 or more - -  Injury with Fall? No - -      Functional Status Survey: Is the patient deaf or have difficulty hearing?: No Does the patient have difficulty seeing, even when wearing glasses/contacts?: No Does the patient have difficulty concentrating, remembering, or making decisions?: No Does the patient have difficulty walking or climbing stairs?: No Does the patient have difficulty dressing or bathing?: No Does  the patient have difficulty doing errands alone such as visiting a doctor's office or shopping?: No   Assessment & Plan  1. Type 2 diabetes mellitus with diabetic neuropathy, without long-term current use of insulin (HCC)  We will change from Amaryl to Glipizide, advised to monitor glucose fasting and post-prandially  - POCT HgB A1C - POCT UA - Microalbumin - Pneumococcal conjugate vaccine 13-valent IM - glipiZIDE (GLUCOTROL XL) 5 MG 24 hr tablet; Take 1 tablet (5 mg total) by mouth daily with breakfast.  Dispense: 90 tablet; Refill: 0   2. Breast cancer screening  - MM Digital Screening; Future  3. Hyperlipemia  - Lipid panel - fenofibrate micronized (LOFIBRA) 134 MG capsule; TAKE 1 CAPSULE (134 MG TOTAL) BY MOUTH DAILY BEFORE BREAKFAST.  Dispense: 90 capsule; Refill: 1  4. Essential hypertension  At goal, continue medication  - Comprehensive metabolic panel - lisinopril (PRINIVIL,ZESTRIL) 5 MG tablet; Take 1 tablet (5 mg total) by mouth daily.  Dispense: 90 tablet; Refill: 1  5. Reflux esophagitis  Advised to try weaning self off - omeprazole (PRILOSEC) 40 MG capsule; Take 1 capsule (40 mg total) by mouth daily.  Dispense: 90 capsule; Refill: 1  6. Osteoarthritis of right hand, unspecified osteoarthritis type  Explained nsaid's long term use can cause kidney problems - try Tylenol when possible - meloxicam (MOBIC) 15 MG tablet; Take 1  tablet (15 mg total) by mouth daily.  Dispense: 90 tablet; Refill: 1

## 2015-12-03 LAB — LIPID PANEL
Chol/HDL Ratio: 3.8 ratio units (ref 0.0–4.4)
Cholesterol, Total: 190 mg/dL (ref 100–199)
HDL: 50 mg/dL (ref >39)
LDL Calculated: 121 mg/dL — ABNORMAL HIGH (ref 0–99)
TRIGLYCERIDES: 94 mg/dL (ref 0–149)
VLDL CHOLESTEROL CAL: 19 mg/dL (ref 5–40)

## 2015-12-03 LAB — COMPREHENSIVE METABOLIC PANEL
ALT: 21 IU/L (ref 0–32)
AST: 18 IU/L (ref 0–40)
Albumin/Globulin Ratio: 1.7 (ref 1.1–2.5)
Albumin: 4.5 g/dL (ref 3.5–5.5)
Alkaline Phosphatase: 61 IU/L (ref 39–117)
BILIRUBIN TOTAL: 0.5 mg/dL (ref 0.0–1.2)
BUN/Creatinine Ratio: 21 (ref 9–23)
BUN: 15 mg/dL (ref 6–24)
CALCIUM: 9.3 mg/dL (ref 8.7–10.2)
CHLORIDE: 102 mmol/L (ref 96–106)
CO2: 21 mmol/L (ref 18–29)
Creatinine, Ser: 0.72 mg/dL (ref 0.57–1.00)
GFR, EST AFRICAN AMERICAN: 107 mL/min/{1.73_m2} (ref >59)
GFR, EST NON AFRICAN AMERICAN: 93 mL/min/{1.73_m2} (ref >59)
GLOBULIN, TOTAL: 2.6 g/dL (ref 1.5–4.5)
Glucose: 140 mg/dL — ABNORMAL HIGH (ref 65–99)
Potassium: 4.8 mmol/L (ref 3.5–5.2)
SODIUM: 140 mmol/L (ref 134–144)
TOTAL PROTEIN: 7.1 g/dL (ref 6.0–8.5)

## 2016-02-28 ENCOUNTER — Other Ambulatory Visit: Payer: Self-pay | Admitting: Family Medicine

## 2016-02-29 ENCOUNTER — Ambulatory Visit: Payer: 59 | Admitting: Family Medicine

## 2016-04-13 ENCOUNTER — Encounter: Payer: Self-pay | Admitting: Family Medicine

## 2016-04-13 ENCOUNTER — Ambulatory Visit (INDEPENDENT_AMBULATORY_CARE_PROVIDER_SITE_OTHER): Payer: 59 | Admitting: Family Medicine

## 2016-04-13 VITALS — BP 124/74 | HR 83 | Temp 98.2°F | Resp 18 | Ht 61.0 in | Wt 170.1 lb

## 2016-04-13 DIAGNOSIS — IMO0002 Reserved for concepts with insufficient information to code with codable children: Secondary | ICD-10-CM

## 2016-04-13 DIAGNOSIS — K2100 Gastro-esophageal reflux disease with esophagitis, without bleeding: Secondary | ICD-10-CM

## 2016-04-13 DIAGNOSIS — E1140 Type 2 diabetes mellitus with diabetic neuropathy, unspecified: Secondary | ICD-10-CM

## 2016-04-13 DIAGNOSIS — E118 Type 2 diabetes mellitus with unspecified complications: Secondary | ICD-10-CM | POA: Insufficient documentation

## 2016-04-13 DIAGNOSIS — I10 Essential (primary) hypertension: Secondary | ICD-10-CM

## 2016-04-13 DIAGNOSIS — E785 Hyperlipidemia, unspecified: Secondary | ICD-10-CM

## 2016-04-13 DIAGNOSIS — E1165 Type 2 diabetes mellitus with hyperglycemia: Secondary | ICD-10-CM | POA: Diagnosis not present

## 2016-04-13 DIAGNOSIS — E669 Obesity, unspecified: Secondary | ICD-10-CM

## 2016-04-13 DIAGNOSIS — E559 Vitamin D deficiency, unspecified: Secondary | ICD-10-CM

## 2016-04-13 LAB — BASIC METABOLIC PANEL
BUN: 21 mg/dL (ref 7–25)
CHLORIDE: 106 mmol/L (ref 98–110)
CO2: 24 mmol/L (ref 20–31)
CREATININE: 0.78 mg/dL (ref 0.50–1.05)
Calcium: 9.3 mg/dL (ref 8.6–10.4)
GLUCOSE: 170 mg/dL — AB (ref 65–99)
POTASSIUM: 4.8 mmol/L (ref 3.5–5.3)
Sodium: 141 mmol/L (ref 135–146)

## 2016-04-13 LAB — POCT GLYCOSYLATED HEMOGLOBIN (HGB A1C): HEMOGLOBIN A1C: 8.1

## 2016-04-13 LAB — VITAMIN B12: Vitamin B-12: 485 pg/mL (ref 200–1100)

## 2016-04-13 LAB — TSH: TSH: 0.4 mIU/L

## 2016-04-13 LAB — GLUCOSE, POCT (MANUAL RESULT ENTRY): POC Glucose: 160 mg/dl — AB (ref 70–99)

## 2016-04-13 MED ORDER — GLIMEPIRIDE 4 MG PO TABS: 4.0000 mg | ORAL_TABLET | Freq: Every day | ORAL | Status: DC

## 2016-04-13 MED ORDER — NORTRIPTYLINE HCL 25 MG PO CAPS: 25.0000 mg | ORAL_CAPSULE | Freq: Every day | ORAL | Status: DC

## 2016-04-13 MED ORDER — OMEPRAZOLE 40 MG PO CPDR: 40.0000 mg | DELAYED_RELEASE_CAPSULE | Freq: Every day | ORAL | Status: DC | PRN

## 2016-04-13 NOTE — Progress Notes (Signed)
Date:  04/13/2016   Name:  Brittany [name]   DOB:  July 10, 1956   MRN:  [id number]  PCP:  Dennison MascotLemont Morrisey, MD    Chief Complaint: Diabetes and Leg Pain   History of Present Illness:  This is a 60 y.o. female seen in 4 month f/u. T2DM with retinopathy, intolerant metformin/glipizide/statins. C/o BLE pain L > R especially at night, intolerant gabapentin, does like Lyrica se's but has not tried. Takes omeprazole prn only. On vit D supp in past. Takes Mobic daily for hip pain.  Review of Systems:  Review of Systems  Constitutional: Negative for fever and fatigue.  Respiratory: Negative for cough and shortness of breath.   Cardiovascular: Negative for chest pain and leg swelling.  Endocrine: Negative for polyuria.  Genitourinary: Negative for difficulty urinating.  Neurological: Negative for syncope and light-headedness.    Patient Active Problem List   Diagnosis Date Noted  . Type 2 diabetes, uncontrolled, with neuropathy (HCC) 04/13/2016  . Vitamin D deficiency 04/13/2016  . Acquired trigger finger 12/02/2015  . Arthritis, degenerative 12/02/2015  . Disorder of peripheral nervous system (HCC) 12/02/2015  . Reflux esophagitis 12/02/2015  . Hx of colonic polyps   . Degenerative joint disease of hand 05/21/2015  . Colon polyps 05/21/2015  . Essential hypertension 05/20/2015  . Hyperlipemia 05/20/2015  . Obesity, Class I, BMI 30-34.9 05/20/2015  . Diabetic retinopathy associated with type 2 diabetes mellitus (HCC) 02/23/2011  . Allergic rhinitis 11/07/2010    Prior to Admission medications   Medication Sig Start Date End Date Taking? Authorizing Provider  glimepiride (AMARYL) 4 MG tablet Take 1 tablet (4 mg total) by mouth daily with breakfast. 04/13/16  Yes Schuyler AmorWilliam Aalijah Lanphere, MD  aspirin 81 MG tablet Take 81 mg by mouth daily.    Historical Provider, MD  co-enzyme Q-10 30 MG capsule Take 30 mg by mouth at bedtime.     Historical Provider, MD  fenofibrate micronized (LOFIBRA) 134 MG  capsule TAKE 1 CAPSULE (134 MG TOTAL) BY MOUTH DAILY BEFORE BREAKFAST. 12/02/15   Alba CoryKrichna Sowles, MD  GLUCOSE BLOOD VI  02/25/14   Historical Provider, MD  lisinopril (PRINIVIL,ZESTRIL) 5 MG tablet Take 1 tablet (5 mg total) by mouth daily. 12/02/15   Alba CoryKrichna Sowles, MD  meloxicam (MOBIC) 15 MG tablet Take 1 tablet (15 mg total) by mouth daily. 12/02/15   Alba CoryKrichna Sowles, MD  nortriptyline (PAMELOR) 25 MG capsule Take 1 capsule (25 mg total) by mouth at bedtime. 04/13/16   Schuyler AmorWilliam Shanie Mauzy, MD  omeprazole (PRILOSEC) 40 MG capsule Take 1 capsule (40 mg total) by mouth daily as needed. 04/13/16   Schuyler AmorWilliam Lynetta Tomczak, MD    Allergies  Allergen Reactions  . Atorvastatin     myalgia  . Metformin & Diet Manage Prod   . Rosuvastatin     MUSCLE ACHES    Past Surgical History  Procedure Laterality Date  . Abdominal hysterectomy    . Hip surgery Right     twice  . Elbow surgery Left   . Colonoscopy with propofol N/A 06/25/2015    Procedure: COLONOSCOPY WITH PROPOFOL;  Surgeon: Midge Miniumarren Wohl, MD;  [facility];  Service: Endoscopy;  Laterality: N/A;  Diabetic - oral meds    Social History  Substance Use Topics  . Smoking status: Never Smoker   . Smokeless tobacco: Never Used  . Alcohol Use: 0.0 oz/week    0 Standard drinks or equivalent per week     Comment: 1 times a year-wine  Family History  Problem Relation Age of Onset  . Diabetes Mother   . Cancer Father   . Diabetes Sister   . Diabetes Brother     Medication list has been reviewed and updated.  Physical Examination: BP 124/74 mmHg  Pulse 83  Temp(Src) 98.2 F (36.8 C)  Resp 18  Ht 5\' 1"  (1.549 m)  Wt 170 lb 2 oz (77.168 kg)  BMI 32.16 kg/m2  SpO2 96%  Physical Exam  Constitutional: She appears well-developed and well-nourished.  Cardiovascular: Normal rate, regular rhythm and normal heart sounds.   Pulmonary/Chest: Effort normal and breath sounds normal.  Musculoskeletal: She exhibits no edema.  Neurological: She  is alert.  Skin: Skin is warm and dry.  Psychiatric: She has a normal mood and affect. Her behavior is normal.  Nursing note and vitals reviewed.   Assessment and Plan:  1. Type 2 diabetes, uncontrolled, with neuropathy (HCC) A1c 8.1% today, increase Amaryl to 4 mg daily (intolerant metformin), begin Pamelor 25 mg qhs for neuropathy - POCT HgB A1C - POCT Glucose (CBG) - B12 - TSH - Urine Microalbumin w/creat. ratio  2. Reflux esophagitis Well controlled on prn omeprazole - omeprazole (PRILOSEC) 40 MG capsule; Take 1 capsule (40 mg total) by mouth daily as needed.  Dispense: 90 capsule; Refill: 1  3. Essential hypertension Well controlled on lisinopril - Basic Metabolic Panel (BMET)  4. Vitamin D deficiency Hx, off supplement - Vitamin D (25 hydroxy)  5. Hyperlipemia Unclear control on Lofibra (intolerant statins)  6. Obesity, Class I, BMI 30-34.9 Exercise/weight loss discussed  7. Med review Unclear benefit of coQ10, Lofibra, and Mobic, consider d/c next next visit  Return in about 3 months (around 07/14/2016).  Dionne AnoWilliam M. Kingsley SpittlePlonk, Jr. MD [facility]  04/13/2016

## 2016-04-14 LAB — MICROALBUMIN / CREATININE URINE RATIO
Creatinine, Urine: 155 mg/dL (ref 20–320)
Microalb Creat Ratio: 7 mcg/mg creat (ref <30)
Microalb, Ur: 1.1 mg/dL

## 2016-04-14 LAB — VITAMIN D 25 HYDROXY (VIT D DEFICIENCY, FRACTURES): Vit D, 25-Hydroxy: 25 ng/mL — ABNORMAL LOW (ref 30–100)

## 2016-04-15 MED ORDER — VITAMIN D 50 MCG (2000 UT) PO CAPS: 1.0000 | ORAL_CAPSULE | Freq: Every day | ORAL | Status: DC

## 2016-04-15 NOTE — Addendum Note (Signed)
Addended by: Schuyler AmorPLONK, Rony Ratz on: 04/15/2016 12:46 PM   Modules accepted: Orders

## 2016-04-19 ENCOUNTER — Telehealth: Payer: Self-pay | Admitting: Family Medicine

## 2016-04-19 NOTE — Telephone Encounter (Signed)
Please call pt back about results before 1pm.

## 2016-04-20 NOTE — Telephone Encounter (Signed)
Spoke with pt and gave her lab results

## 2016-05-01 ENCOUNTER — Other Ambulatory Visit: Payer: Self-pay

## 2016-05-01 DIAGNOSIS — M19041 Primary osteoarthritis, right hand: Secondary | ICD-10-CM

## 2016-05-01 NOTE — Telephone Encounter (Signed)
Patient requesting refill. 

## 2016-05-02 MED ORDER — MELOXICAM 15 MG PO TABS: 15.0000 mg | ORAL_TABLET | Freq: Every day | ORAL | Status: DC

## 2016-05-02 NOTE — Telephone Encounter (Signed)
She needs follow up in 06/2016 for DM

## 2016-05-02 NOTE — Telephone Encounter (Signed)
Patient states that she just saw Dr Hollace HaywardPlonk on 04-13-16

## 2016-05-02 NOTE — Telephone Encounter (Signed)
Tried contacting patient but was not able to leave voice message due to voice mail not being set up

## 2016-05-11 LAB — HM DIABETES EYE EXAM

## 2016-05-26 ENCOUNTER — Other Ambulatory Visit: Payer: Self-pay | Admitting: Family Medicine

## 2016-05-26 DIAGNOSIS — K2100 Gastro-esophageal reflux disease with esophagitis, without bleeding: Secondary | ICD-10-CM

## 2016-05-26 DIAGNOSIS — M19041 Primary osteoarthritis, right hand: Secondary | ICD-10-CM

## 2016-05-26 DIAGNOSIS — E785 Hyperlipidemia, unspecified: Secondary | ICD-10-CM

## 2016-05-26 NOTE — Telephone Encounter (Signed)
Please ask her to come in today.

## 2016-05-29 ENCOUNTER — Other Ambulatory Visit: Payer: Self-pay | Admitting: Family Medicine

## 2016-05-29 DIAGNOSIS — E785 Hyperlipidemia, unspecified: Secondary | ICD-10-CM

## 2016-05-30 ENCOUNTER — Other Ambulatory Visit: Payer: Self-pay | Admitting: Family Medicine

## 2016-05-30 DIAGNOSIS — E785 Hyperlipidemia, unspecified: Secondary | ICD-10-CM

## 2016-06-01 ENCOUNTER — Other Ambulatory Visit: Payer: Self-pay

## 2016-06-01 DIAGNOSIS — E785 Hyperlipidemia, unspecified: Secondary | ICD-10-CM

## 2016-06-02 NOTE — Telephone Encounter (Signed)
Please schedule patient an appt before mediation can be filled.

## 2016-07-24 ENCOUNTER — Ambulatory Visit (INDEPENDENT_AMBULATORY_CARE_PROVIDER_SITE_OTHER): Payer: 59 | Admitting: Family

## 2016-07-24 ENCOUNTER — Encounter: Payer: Self-pay | Admitting: Family

## 2016-07-24 ENCOUNTER — Encounter (INDEPENDENT_AMBULATORY_CARE_PROVIDER_SITE_OTHER): Payer: Self-pay

## 2016-07-24 VITALS — BP 155/90 | HR 83 | Temp 98.4°F | Ht 61.0 in | Wt 171.4 lb

## 2016-07-24 DIAGNOSIS — E785 Hyperlipidemia, unspecified: Secondary | ICD-10-CM | POA: Insufficient documentation

## 2016-07-24 DIAGNOSIS — I10 Essential (primary) hypertension: Secondary | ICD-10-CM

## 2016-07-24 DIAGNOSIS — E1140 Type 2 diabetes mellitus with diabetic neuropathy, unspecified: Secondary | ICD-10-CM

## 2016-07-24 DIAGNOSIS — Z7689 Persons encountering health services in other specified circumstances: Secondary | ICD-10-CM | POA: Diagnosis not present

## 2016-07-24 DIAGNOSIS — M170 Bilateral primary osteoarthritis of knee: Secondary | ICD-10-CM

## 2016-07-24 DIAGNOSIS — Z1239 Encounter for other screening for malignant neoplasm of breast: Secondary | ICD-10-CM

## 2016-07-24 DIAGNOSIS — Z1231 Encounter for screening mammogram for malignant neoplasm of breast: Secondary | ICD-10-CM

## 2016-07-24 DIAGNOSIS — E1165 Type 2 diabetes mellitus with hyperglycemia: Secondary | ICD-10-CM

## 2016-07-24 DIAGNOSIS — E119 Type 2 diabetes mellitus without complications: Secondary | ICD-10-CM

## 2016-07-24 DIAGNOSIS — Z0000 Encounter for general adult medical examination without abnormal findings: Secondary | ICD-10-CM | POA: Insufficient documentation

## 2016-07-24 DIAGNOSIS — IMO0002 Reserved for concepts with insufficient information to code with codable children: Secondary | ICD-10-CM

## 2016-07-24 LAB — MICROALBUMIN / CREATININE URINE RATIO
CREATININE, U: 132.8 mg/dL
MICROALB UR: 0.8 mg/dL (ref 0.0–1.9)
Microalb Creat Ratio: 0.6 mg/g (ref 0.0–30.0)

## 2016-07-24 LAB — LIPID PANEL
CHOL/HDL RATIO: 4
Cholesterol: 200 mg/dL (ref 0–200)
HDL: 54 mg/dL (ref >39.00)
LDL CALC: 130 mg/dL — AB (ref 0–99)
NONHDL: 146.3
Triglycerides: 82 mg/dL (ref 0.0–149.0)
VLDL: 16.4 mg/dL (ref 0.0–40.0)

## 2016-07-24 LAB — CBC WITH DIFFERENTIAL/PLATELET
BASOS ABS: 0 10*3/uL (ref 0.0–0.1)
Basophils Relative: 0.2 % (ref 0.0–3.0)
EOS ABS: 0.2 10*3/uL (ref 0.0–0.7)
Eosinophils Relative: 3.9 % (ref 0.0–5.0)
HCT: 43.5 % (ref 36.0–46.0)
Hemoglobin: 14.8 g/dL (ref 12.0–15.0)
LYMPHS ABS: 1.9 10*3/uL (ref 0.7–4.0)
Lymphocytes Relative: 30.9 % (ref 12.0–46.0)
MCHC: 34 g/dL (ref 30.0–36.0)
MCV: 88.3 fl (ref 78.0–100.0)
Monocytes Absolute: 0.5 10*3/uL (ref 0.1–1.0)
Monocytes Relative: 8.9 % (ref 3.0–12.0)
NEUTROS ABS: 3.4 10*3/uL (ref 1.4–7.7)
NEUTROS PCT: 56.1 % (ref 43.0–77.0)
PLATELETS: 258 10*3/uL (ref 150.0–400.0)
RBC: 4.93 Mil/uL (ref 3.87–5.11)
RDW: 13.3 % (ref 11.5–15.5)
WBC: 6.1 10*3/uL (ref 4.0–10.5)

## 2016-07-24 LAB — COMPREHENSIVE METABOLIC PANEL
ALK PHOS: 56 U/L (ref 39–117)
ALT: 27 U/L (ref 0–35)
AST: 21 U/L (ref 0–37)
Albumin: 4.1 g/dL (ref 3.5–5.2)
BILIRUBIN TOTAL: 0.6 mg/dL (ref 0.2–1.2)
BUN: 17 mg/dL (ref 6–23)
CO2: 26 meq/L (ref 19–32)
CREATININE: 0.63 mg/dL (ref 0.40–1.20)
Calcium: 9 mg/dL (ref 8.4–10.5)
Chloride: 106 mEq/L (ref 96–112)
GFR: 102.62 mL/min (ref >60.00)
GLUCOSE: 115 mg/dL — AB (ref 70–99)
Potassium: 4.1 mEq/L (ref 3.5–5.1)
Sodium: 141 mEq/L (ref 135–145)
TOTAL PROTEIN: 7.5 g/dL (ref 6.0–8.3)

## 2016-07-24 LAB — HEMOGLOBIN A1C: Hgb A1c MFr Bld: 8.4 % — ABNORMAL HIGH (ref 4.6–6.5)

## 2016-07-24 LAB — VITAMIN D 25 HYDROXY (VIT D DEFICIENCY, FRACTURES): VITD: 33.57 ng/mL (ref 30.00–100.00)

## 2016-07-24 LAB — TSH: TSH: 0.32 u[IU]/mL — ABNORMAL LOW (ref 0.35–4.50)

## 2016-07-24 MED ORDER — LISINOPRIL 10 MG PO TABS: 10.0000 mg | ORAL_TABLET | Freq: Every day | ORAL | 3 refills | Status: DC

## 2016-07-24 MED ORDER — NORTRIPTYLINE HCL 25 MG PO CAPS: 25.0000 mg | ORAL_CAPSULE | Freq: Every day | ORAL | 2 refills | Status: DC

## 2016-07-24 MED ORDER — GLIMEPIRIDE 4 MG PO TABS: 4.0000 mg | ORAL_TABLET | Freq: Every day | ORAL | 2 refills | Status: DC

## 2016-07-24 MED ORDER — FENOFIBRATE MICRONIZED 134 MG PO CAPS: ORAL_CAPSULE | ORAL | 1 refills | Status: DC

## 2016-07-24 NOTE — Patient Instructions (Signed)
Pleasure meeting you.   Please keep BP log.   Managing Your High Blood Pressure Blood pressure is a measurement of how forceful your blood is pressing against the walls of the arteries. Arteries are muscular tubes within the circulatory system. Blood pressure does not stay the same. Blood pressure rises when you are active, excited, or nervous; and it lowers during sleep and relaxation. If the numbers measuring your blood pressure stay above normal most of the time, you are at risk for health problems. High blood pressure (hypertension) is a long-term (chronic) condition in which blood pressure is elevated. A blood pressure reading is recorded as two numbers, such as 120 over 80 (or 120/80). The first, higher number is called the systolic pressure. It is a measure of the pressure in your arteries as the heart beats. The second, lower number is called the diastolic pressure. It is a measure of the pressure in your arteries as the heart relaxes between beats.  Keeping your blood pressure in a normal range is important to your overall health and prevention of health problems, such as heart disease and stroke. When your blood pressure is uncontrolled, your heart has to work harder than normal. High blood pressure is a very common condition in adults because blood pressure tends to rise with age. Men and women are equally likely to have hypertension but at different times in life. Before age 59, men are more likely to have hypertension. After 59 years of age, women are more likely to have it. Hypertension is especially common in African Americans. This condition often has no signs or symptoms. The cause of the condition is usually not known. Your caregiver can help you come up with a plan to keep your blood pressure in a normal, healthy range. BLOOD PRESSURE STAGES Blood pressure is classified into four stages: normal, prehypertension, stage 1, and stage 2. Your blood pressure reading will be used to determine  what type of treatment, if any, is necessary. Appropriate treatment options are tied to these four stages:  Normal  Systolic pressure (mm Hg): below 120.  Diastolic pressure (mm Hg): below 80. Prehypertension  Systolic pressure (mm Hg): 120 to 139.  Diastolic pressure (mm Hg): 80 to 89. Stage1  Systolic pressure (mm Hg): 140 to 159.  Diastolic pressure (mm Hg): 90 to 99. Stage2  Systolic pressure (mm Hg): 160 or above.  Diastolic pressure (mm Hg): 100 or above. RISKS RELATED TO HIGH BLOOD PRESSURE Managing your blood pressure is an important responsibility. Uncontrolled high blood pressure can lead to:  A heart attack.  A stroke.  A weakened blood vessel (aneurysm).  Heart failure.  Kidney damage.  Eye damage.  Metabolic syndrome.  Memory and concentration problems. HOW TO MANAGE YOUR BLOOD PRESSURE Blood pressure can be managed effectively with lifestyle changes and medicines (if needed). Your caregiver will help you come up with a plan to bring your blood pressure within a normal range. Your plan should include the following: Education  Read all information provided by your caregivers about how to control blood pressure.  Educate yourself on the latest guidelines and treatment recommendations. New research is always being done to further define the risks and treatments for high blood pressure. Lifestylechanges  Control your weight.  Avoid smoking.  Stay physically active.  Reduce the amount of salt in your diet.  Reduce stress.  Control any chronic conditions, such as high cholesterol or diabetes.  Reduce your alcohol intake. Medicines  Several medicines (antihypertensive medicines) are  available, if needed, to bring blood pressure within a normal range. Communication  Review all the medicines you take with your caregiver because there may be side effects or interactions.  Talk with your caregiver about your diet, exercise habits, and other  lifestyle factors that may be contributing to high blood pressure.  See your caregiver regularly. Your caregiver can help you create and adjust your plan for managing high blood pressure. RECOMMENDATIONS FOR TREATMENT AND FOLLOW-UP  The following recommendations are based on current guidelines for managing high blood pressure in nonpregnant adults. Use these recommendations to identify the proper follow-up period or treatment option based on your blood pressure reading. You can discuss these options with your caregiver.  Systolic pressure of 120 to 139 or diastolic pressure of 80 to 89: Follow up with your caregiver as directed.  Systolic pressure of 140 to 160 or diastolic pressure of 90 to 100: Follow up with your caregiver within 2 months.  Systolic pressure above 160 or diastolic pressure above 100: Follow up with your caregiver within 1 month.  Systolic pressure above 180 or diastolic pressure above 110: Consider antihypertensive therapy; follow up with your caregiver within 1 week.  Systolic pressure above 200 or diastolic pressure above 120: Begin antihypertensive therapy; follow up with your caregiver within 1 week.   This information is not intended to replace advice given to you by your health care provider. Make sure you discuss any questions you have with your health care provider.   Document Released: 07/03/2012 Document Reviewed: 07/03/2012 Elsevier Interactive Patient Education [employer]

## 2016-07-24 NOTE — Assessment & Plan Note (Deleted)
Pending A1C. Continue current regimen. Advised to start testing BP.

## 2016-07-24 NOTE — Assessment & Plan Note (Signed)
Pending lipid panel. Stain intolerant. On fenofibrate.

## 2016-07-24 NOTE — Assessment & Plan Note (Addendum)
Uncontrolled. Increase lisinopril. Pending BMP. Follow up one month.

## 2016-07-24 NOTE — Progress Notes (Signed)
Pre visit review using our clinic review tool, if applicable. No additional management support is needed unless otherwise documented below in the visit note. 

## 2016-07-24 NOTE — Assessment & Plan Note (Signed)
Reviewed past medical social and family history. She is due for her mammogram which was scheduled today.  Per patient her colonoscopy was in 2016 normal. We're requesting records of this. She is due for her pap smear and that'll be done in our follow up visit; note, likely her last pap as she has had hysterectomy and no abnormal pap. Will discuss discontinuation with patient per guidelines during CPE.

## 2016-07-24 NOTE — Progress Notes (Signed)
Subjective:    Patient ID: Brittany [name], female    DOB: 1956-09-23, 60 y.o.   MRN: [id number]  CC: Brittany [name] is a 60 y.o. female who presents today to establish care as new patient.   HPI: Patient presents today to establish care as a Prior care was with Dr. Thana Ates and then Dr. Hollace Hayward with [facility]. Was unhappy there and here to establish care.   DM- ast A1c 8.1 3 months ago. Takes glimiride.  Not checking blood sugars at home.   HTN- Thinks she is nervous here and elevated. Denies exertional chest pain or pressure, numbness or tingling radiating to left arm or jaw, palpitations, dizziness, frequent headaches, changes in vision, or shortness of breath.    HLD- Statin intolerant. Doing well on fenofibrate.   Osteoarthritis in knees- stands most of day working at [employer]. Also has neuropathy in bilateral feet for which she take pamelor at bedtime for pain, neuropathy. Tried to go without amitriptyline for a couple of days and 'pain in legs was terrible.' No h/o  CVD, heart arrhthymias.   Per patient, had colonoscopy 2016 per patient, normal.  Has pap every 5-6 years since hysterectomy, no h/o abnormal pap smears.   Due for mammogram.       HISTORY:  Past Medical History:  Diagnosis Date  . Diabetes mellitus without complication (HCC)   . Hyperlipidemia   . Hypertension    pt states on medication as prevenative  . Obesity    Past Surgical History:  Procedure Laterality Date  . ABDOMINAL HYSTERECTOMY    . COLONOSCOPY WITH PROPOFOL N/A 06/25/2015   Procedure: COLONOSCOPY WITH PROPOFOL;  Surgeon: Midge Minium, MD;  Location: [facility];  Service: Endoscopy;  Laterality: N/A;  Diabetic - oral meds  . ELBOW SURGERY Left   . HIP SURGERY Right    twice   Family History  Problem Relation Age of Onset  . Diabetes Mother   . Cancer Father     unknown; '90 % cancer'  . Diabetes Sister   . Diabetes Brother   . Breast cancer Cousin     unknown age at dx.     Allergies: Atorvastatin; Metformin & diet manage prod; and Rosuvastatin Current Outpatient Prescriptions on File Prior to Visit  Medication Sig Dispense Refill  . aspirin 81 MG tablet Take 81 mg by mouth daily.    . Cholecalciferol (VITAMIN D) 2000 units CAPS Take 1 capsule (2,000 Units total) by mouth daily. 30 capsule   . co-enzyme Q-10 30 MG capsule Take 30 mg by mouth at bedtime.     Marland Kitchen GLUCOSE BLOOD VI     . meloxicam (MOBIC) 15 MG tablet Take 1 tablet (15 mg total) by mouth daily. 90 tablet 1  . omeprazole (PRILOSEC) 40 MG capsule Take 1 capsule (40 mg total) by mouth daily as needed. 90 capsule 1   No current facility-administered medications on file prior to visit.     Social History  Substance Use Topics  . Smoking status: Never Smoker  . Smokeless tobacco: Never Used  . Alcohol use 0.0 oz/week     Comment: 1 times a year-wine    Review of Systems  Constitutional: Negative for chills, fever and unexpected weight change.  HENT: Negative for congestion.   Respiratory: Negative for cough and shortness of breath.   Cardiovascular: Negative for chest pain, palpitations and leg swelling.  Gastrointestinal: Negative for nausea and vomiting.  Genitourinary: Negative for vaginal discharge and  vaginal pain.  Musculoskeletal: Negative for arthralgias and myalgias.  Skin: Negative for rash.  Neurological: Negative for headaches.  Hematological: Negative for adenopathy.  Psychiatric/Behavioral: Negative for confusion.      Objective:    BP (!) 155/90   Pulse 83   Temp 98.4 F (36.9 C) (Oral)   Ht 5\' 1"  (1.549 m)   Wt 171 lb 6.4 oz (77.7 kg)   SpO2 98%   BMI 32.39 kg/m  BP Readings from Last 3 Encounters:  07/24/16 (!) 155/90  04/13/16 124/74  12/02/15 130/74   Wt Readings from Last 3 Encounters:  07/24/16 171 lb 6.4 oz (77.7 kg)  04/13/16 170 lb 2 oz (77.2 kg)  12/02/15 175 lb 11.2 oz (79.7 kg)    Physical Exam  Constitutional: She appears well-developed and  well-nourished.  Eyes: Conjunctivae are normal.  Cardiovascular: Normal rate, regular rhythm, normal heart sounds and normal pulses.   Pulmonary/Chest: Effort normal and breath sounds normal. She has no wheezes. She has no rhonchi. She has no rales.  Neurological: She is alert.  Skin: Skin is warm and dry.  Psychiatric: She has a normal mood and affect. Her speech is normal and behavior is normal. Thought content normal.  Vitals reviewed.      Assessment & Plan:   Problem List Items Addressed This Visit      Cardiovascular and Mediastinum   Essential hypertension    Uncontrolled. Increase lisinopril. Pending BMP. Follow up one month.      Relevant Medications   fenofibrate micronized (LOFIBRA) 134 MG capsule   lisinopril (PRINIVIL,ZESTRIL) 10 MG tablet     Endocrine   Type 2 diabetes, uncontrolled, with neuropathy (HCC)    Pending A1C. Continue current regimen. Advised to start testing BS.        Relevant Medications   glimepiride (AMARYL) 4 MG tablet   lisinopril (PRINIVIL,ZESTRIL) 10 MG tablet     Musculoskeletal and Integument   Arthritis, degenerative    Chronic, Stable. Knee and leg pain. Well controlled on mobic and Pamelor. Kidney function normal. No CVD history. Low dose of TCA.  She is also taking omeprazole. Continue current regimen.       Relevant Medications   nortriptyline (PAMELOR) 25 MG capsule     Other   Encounter to establish care - Primary    Reviewed past medical social and family history. She is due for her mammogram which was scheduled today.  Per patient her colonoscopy was in 2016 normal. We're requesting records of this. She is due for her pap smear and that'll be done in our follow up visit; note, likely her last pap as she has had hysterectomy and no abnormal pap. Will discuss discontinuation with patient per guidelines during CPE.       Relevant Orders   CBC with Differential/Platelet   Comprehensive metabolic panel   Hemoglobin A1c    Lipid panel   Microalbumin / creatinine urine ratio   TSH   VITAMIN D 25 Hydroxy (Vit-D Deficiency, Fractures)   HIV antibody   RESOLVED: Hyperlipemia   Relevant Medications   fenofibrate micronized (LOFIBRA) 134 MG capsule   lisinopril (PRINIVIL,ZESTRIL) 10 MG tablet    Other Visit Diagnoses    Screening for breast cancer       Relevant Orders   MM DIGITAL SCREENING BILATERAL   Type 2 diabetes mellitus without complication, without long-term current use of insulin (HCC)       Relevant Medications   glimepiride (AMARYL) 4 MG  tablet   lisinopril (PRINIVIL,ZESTRIL) 10 MG tablet       I have discontinued Ms. Cafarelli's lisinopril. I am also having her start on lisinopril. Additionally, I am having her maintain her aspirin, co-enzyme Q-10, GLUCOSE BLOOD VI, omeprazole, Vitamin D, meloxicam, glimepiride, nortriptyline, and fenofibrate micronized.   Meds ordered this encounter  Medications  . glimepiride (AMARYL) 4 MG tablet    Sig: Take 1 tablet (4 mg total) by mouth daily with breakfast.    Dispense:  30 tablet    Refill:  2    Order Specific Question:   Supervising Provider    Answer:   Duncan Dull L [[id number]]  . nortriptyline (PAMELOR) 25 MG capsule    Sig: Take 1 capsule (25 mg total) by mouth at bedtime.    Dispense:  30 capsule    Refill:  2    Order Specific Question:   Supervising Provider    Answer:   Duncan Dull L [[id number]]  . fenofibrate micronized (LOFIBRA) 134 MG capsule    Sig: TAKE 1 CAPSULE (134 MG TOTAL) BY MOUTH DAILY BEFORE BREAKFAST.    Dispense:  90 capsule    Refill:  1    Order Specific Question:   Supervising Provider    Answer:   Darrick Huntsman, TERESA L [[id number]]  . lisinopril (PRINIVIL,ZESTRIL) 10 MG tablet    Sig: Take 1 tablet (10 mg total) by mouth daily.    Dispense:  90 tablet    Refill:  3    Order Specific Question:   Supervising Provider    Answer:   Sherlene Shams [[id number]]    Return precautions given.   Risks, benefits, and alternatives of the  medications and treatment plan prescribed today were discussed, and patient expressed understanding.   Education regarding symptom management and diagnosis given to patient on AVS.  Continue to follow with [name], FNP for routine health maintenance.   Chancy Milroy and I agreed with plan.   [name], FNP

## 2016-07-24 NOTE — Assessment & Plan Note (Addendum)
Chronic, Stable. Knee and leg pain. Well controlled on mobic and Pamelor. Kidney function normal. No CVD history. Low dose of TCA.  She is also taking omeprazole. Continue current regimen.

## 2016-07-24 NOTE — Assessment & Plan Note (Signed)
Pending A1C. Continue current regimen. Advised to start testing BS.

## 2016-07-25 LAB — HIV ANTIBODY (ROUTINE TESTING W REFLEX): HIV: NONREACTIVE

## 2016-07-27 ENCOUNTER — Encounter: Payer: Self-pay | Admitting: *Deleted

## 2016-08-07 ENCOUNTER — Telehealth: Payer: Self-pay | Admitting: *Deleted

## 2016-08-07 ENCOUNTER — Telehealth: Payer: Self-pay

## 2016-08-07 MED ORDER — GLUCOSE BLOOD VI STRP: ORAL_STRIP | 12 refills | Status: DC

## 2016-08-07 NOTE — Telephone Encounter (Signed)
Patient requested a Rx for test strips for her meter, she uses a one touch ultra meter.  Pharmacy [facility] [place]

## 2016-08-07 NOTE — Telephone Encounter (Signed)
sent 

## 2016-08-09 MED ORDER — GLUCOSE BLOOD VI STRP: ORAL_STRIP | 6 refills | Status: DC

## 2016-08-09 NOTE — Telephone Encounter (Signed)
Pt called about the pharmacy not having her Rx for glucose test strips for meter one touch ultra. glucose blood test strip  [id number] [place] - [address]  Call pt @ [phone]. Thank you!

## 2016-08-09 NOTE — Telephone Encounter (Signed)
Medication RX will be awaiting signature from provider.

## 2016-08-09 NOTE — Addendum Note (Signed)
Addended by: Elise BenneBOOTH, Mayes Sangiovanni T on: 08/09/2016 03:53 PM   Modules accepted: Orders

## 2016-08-17 ENCOUNTER — Ambulatory Visit
Admission: RE | Admit: 2016-08-17 | Discharge: 2016-08-17 | Disposition: A | Discharge status: Home / Self Care | Payer: 59 | Source: Ambulatory Visit | Attending: Family | Admitting: Family

## 2016-08-17 ENCOUNTER — Other Ambulatory Visit: Payer: Self-pay

## 2016-08-17 DIAGNOSIS — Z1239 Encounter for other screening for malignant neoplasm of breast: Secondary | ICD-10-CM

## 2016-08-17 DIAGNOSIS — E119 Type 2 diabetes mellitus without complications: Secondary | ICD-10-CM

## 2016-08-17 DIAGNOSIS — M170 Bilateral primary osteoarthritis of knee: Secondary | ICD-10-CM

## 2016-08-17 DIAGNOSIS — Z1231 Encounter for screening mammogram for malignant neoplasm of breast: Secondary | ICD-10-CM | POA: Insufficient documentation

## 2016-08-17 MED ORDER — GLIMEPIRIDE 4 MG PO TABS: 4.0000 mg | ORAL_TABLET | Freq: Every day | ORAL | 1 refills | Status: DC

## 2016-08-17 MED ORDER — GLUCOSE BLOOD VI STRP: ORAL_STRIP | 6 refills | Status: DC

## 2016-08-17 MED ORDER — NORTRIPTYLINE HCL 25 MG PO CAPS: 25.0000 mg | ORAL_CAPSULE | Freq: Every day | ORAL | 1 refills | Status: DC

## 2016-08-17 NOTE — Telephone Encounter (Signed)
Amount of medication has changed form 30 to 90 days supply. refill has been placed as directed.

## 2016-09-13 ENCOUNTER — Encounter: Payer: 59 | Admitting: Family

## 2016-09-25 ENCOUNTER — Ambulatory Visit (INDEPENDENT_AMBULATORY_CARE_PROVIDER_SITE_OTHER): Payer: 59 | Admitting: Family

## 2016-09-25 ENCOUNTER — Encounter: Payer: Self-pay | Admitting: Family

## 2016-09-25 VITALS — BP 140/85 | HR 98 | Temp 98.2°F | Ht 60.0 in | Wt 170.8 lb

## 2016-09-25 DIAGNOSIS — Z0000 Encounter for general adult medical examination without abnormal findings: Secondary | ICD-10-CM | POA: Diagnosis not present

## 2016-09-25 DIAGNOSIS — E785 Hyperlipidemia, unspecified: Secondary | ICD-10-CM

## 2016-09-25 DIAGNOSIS — I10 Essential (primary) hypertension: Secondary | ICD-10-CM | POA: Diagnosis not present

## 2016-09-25 DIAGNOSIS — E1165 Type 2 diabetes mellitus with hyperglycemia: Secondary | ICD-10-CM

## 2016-09-25 DIAGNOSIS — E1140 Type 2 diabetes mellitus with diabetic neuropathy, unspecified: Secondary | ICD-10-CM

## 2016-09-25 DIAGNOSIS — IMO0002 Reserved for concepts with insufficient information to code with codable children: Secondary | ICD-10-CM

## 2016-09-25 MED ORDER — LIRAGLUTIDE 18 MG/3ML ~~LOC~~ SOPN: PEN_INJECTOR | SUBCUTANEOUS | 12 refills | Status: DC

## 2016-09-25 MED ORDER — SIMVASTATIN 20 MG PO TABS: 20.0000 mg | ORAL_TABLET | Freq: Every evening | ORAL | 3 refills | Status: DC

## 2016-09-25 NOTE — Progress Notes (Signed)
Subjective:    Patient ID: Brittany [name], female    DOB: October 23, 1956, 60 y.o.   MRN: [id number]  CC: Brittany [name] is a 60 y.o. female who presents today for physical exam.    HPI: Feeling well.   HLD- Tried lipitor, niacin however couldn't tolerate medications.   DM- fasting averages 107 to 217. On amaryl. No hypoglycemic episodes. No h/o pancreatitis or thyroid cancer.  HTN- took herself off of lisinopril as thought it caused throat irritation.         Colorectal Cancer Screening: UTD 2016. No polyps. Repeat in 5 years. Breast Cancer Screening: Mammogram UTD Cervical Cancer Screening: History of hysterectomy. No GYN cancer. No pelvic complaints.  Bone Health screening/DEXA for 65+: No increased fracture risk. Defer screening at this time Lung Cancer Screening: Doesn't have 30 year pack year history and age > 55 years.  Immunizations       Tetanus - UTD        Pneumococcal -UTD Labs: Done prior Exercise: Gets regular exercise.  Alcohol use: Occasional Smoking/tobacco use: Nonsmoker.  Regular dental exams: In need of dental exam. Wears seat belt: Yes.   HISTORY:  Past Medical History:  Diagnosis Date  . Diabetes mellitus without complication (HCC)   . Hyperlipidemia   . Hypertension    pt states on medication as prevenative  . Obesity     Past Surgical History:  Procedure Laterality Date  . ABDOMINAL HYSTERECTOMY    . COLONOSCOPY WITH PROPOFOL N/A 06/25/2015   Procedure: COLONOSCOPY WITH PROPOFOL;  Surgeon: Midge Miniumarren Wohl, MD;  Location: [facility];  Service: Endoscopy;  Laterality: N/A;  Diabetic - oral meds  . ELBOW SURGERY Left   . HIP SURGERY Right    twice   Family History  Problem Relation Age of Onset  . Diabetes Mother   . Cancer Father     unknown; '90 % cancer'  . Diabetes Sister   . Diabetes Brother   . Breast cancer Cousin     unknown age at dx.      ALLERGIES: Atorvastatin; Lisinopril; Metformin & diet manage prod; and  Rosuvastatin  Current Outpatient Prescriptions on File Prior to Visit  Medication Sig Dispense Refill  . aspirin 81 MG tablet Take 81 mg by mouth daily.    . Cholecalciferol (VITAMIN D) 2000 units CAPS Take 1 capsule (2,000 Units total) by mouth daily. 30 capsule   . co-enzyme Q-10 30 MG capsule Take 30 mg by mouth at bedtime.     . fenofibrate micronized (LOFIBRA) 134 MG capsule TAKE 1 CAPSULE (134 MG TOTAL) BY MOUTH DAILY BEFORE BREAKFAST. 90 capsule 1  . glimepiride (AMARYL) 4 MG tablet Take 1 tablet (4 mg total) by mouth daily with breakfast. 90 tablet 1  . glucose blood test strip PT uses one touch test strips. Check glucose bid 100 each 6  . meloxicam (MOBIC) 15 MG tablet Take 1 tablet (15 mg total) by mouth daily. 90 tablet 1  . nortriptyline (PAMELOR) 25 MG capsule Take 1 capsule (25 mg total) by mouth at bedtime. 90 capsule 1  . omeprazole (PRILOSEC) 40 MG capsule Take 1 capsule (40 mg total) by mouth daily as needed. 90 capsule 1   No current facility-administered medications on file prior to visit.     Social History  Substance Use Topics  . Smoking status: Never Smoker  . Smokeless tobacco: Never Used  . Alcohol use 0.0 oz/week     Comment: 1 times  a year-wine    Review of Systems  Constitutional: Negative for chills, fever and unexpected weight change.  HENT: Negative for congestion.   Respiratory: Negative for cough.   Cardiovascular: Negative for chest pain, palpitations and leg swelling.  Gastrointestinal: Negative for nausea and vomiting.  Genitourinary: Negative for dysuria and vaginal bleeding.  Musculoskeletal: Negative for arthralgias and myalgias.  Skin: Negative for rash.  Neurological: Negative for headaches.  Hematological: Negative for adenopathy.  Psychiatric/Behavioral: Negative for confusion.      Objective:    BP 140/85   Pulse 98   Temp 98.2 F (36.8 C) (Oral)   Ht 5' (1.524 m)   Wt 170 lb 12.8 oz (77.5 kg)   SpO2 98%   BMI 33.36 kg/m    BP Readings from Last 3 Encounters:  09/25/16 140/85  07/24/16 (!) 155/90  04/13/16 124/74   Wt Readings from Last 3 Encounters:  09/25/16 170 lb 12.8 oz (77.5 kg)  07/24/16 171 lb 6.4 oz (77.7 kg)  04/13/16 170 lb 2 oz (77.2 kg)    Physical Exam  Constitutional: She appears well-developed and well-nourished.  Eyes: Conjunctivae are normal.  Neck: No thyroid mass and no thyromegaly present.  Cardiovascular: Normal rate, regular rhythm, normal heart sounds and normal pulses.   Pulmonary/Chest: Effort normal and breath sounds normal. She has no wheezes. She has no rhonchi. She has no rales. Right breast exhibits no inverted nipple, no mass, no nipple discharge, no skin change and no tenderness. Left breast exhibits no inverted nipple, no mass, no nipple discharge, no skin change and no tenderness. Breasts are symmetrical.  CBE performed.   Lymphadenopathy:       Head (right side): No submental, no submandibular, no tonsillar, no preauricular, no posterior auricular and no occipital adenopathy present.       Head (left side): No submental, no submandibular, no tonsillar, no preauricular, no posterior auricular and no occipital adenopathy present.    She has no cervical adenopathy.       Right cervical: No superficial cervical, no deep cervical and no posterior cervical adenopathy present.      Left cervical: No superficial cervical, no deep cervical and no posterior cervical adenopathy present.    She has no axillary adenopathy.  Neurological: She is alert.  Skin: Skin is warm and dry.  Psychiatric: She has a normal mood and affect. Her speech is normal and behavior is normal. Thought content normal.  Vitals reviewed.      Assessment & Plan:   Problem List Items Addressed This Visit      Cardiovascular and Mediastinum   Essential hypertension - Primary    Off lisinopril. At goal. Will monitor at home for goal <140/90.      Relevant Medications   simvastatin (ZOCOR) 20 MG  tablet     Endocrine   Type 2 diabetes, uncontrolled, with neuropathy (HCC)    Uncontrolled. Had been on victoza in the past; will start again. F/u 3 months.       Relevant Medications   liraglutide 18 MG/3ML SOPN   simvastatin (ZOCOR) 20 MG tablet     Other   Hyperlipidemia    Trial low dose zocor twice a week to see if tolerate. LDL> 70. Will follow.      Relevant Medications   simvastatin (ZOCOR) 20 MG tablet   Routine physical examination    Up-to-date on colonoscopy, mammogram. She is a history of hysterectomy and politely declines pelvic exam, Pap today based on  preference which is understandable. Nonsmoker. Patient is up-to-date on immunizations. Screening labs done prior.  CBE performed.          I have discontinued Ms. Lumb's lisinopril. I am also having her start on liraglutide and simvastatin. Additionally, I am having her maintain her aspirin, co-enzyme Q-10, omeprazole, Vitamin D, meloxicam, fenofibrate micronized, nortriptyline, glucose blood, and glimepiride.   Meds ordered this encounter  Medications  . liraglutide 18 MG/3ML SOPN    Sig: Start 0.6 mg [place] qd x 1 week, then 1.2 mg Melvin qd thereafter    Dispense:  6 mL    Refill:  12    Order Specific Question:   Supervising Provider    Answer:   Duncan DullULLO, TERESA L [[id number]]  . simvastatin (ZOCOR) 20 MG tablet    Sig: Take 1 tablet (20 mg total) by mouth every evening.    Dispense:  90 tablet    Refill:  3    Order Specific Question:   Supervising Provider    Answer:   Sherlene ShamsULLO, TERESA L [[id number]]    Return precautions given.   Risks, benefits, and alternatives of the medications and treatment plan prescribed today were discussed, and patient expressed understanding.   Education regarding symptom management and diagnosis given to patient on AVS.   Continue to follow with Rennie PlowmanMargaret Yaritzy Huser, FNP for routine health maintenance.   Brittany [name] and I agreed with plan.   Rennie PlowmanMargaret Shavone Nevers, FNP

## 2016-09-25 NOTE — Progress Notes (Signed)
Pre visit review using our clinic review tool, if applicable. No additional management support is needed unless otherwise documented below in the visit note. 

## 2016-09-25 NOTE — Assessment & Plan Note (Signed)
Off lisinopril. At goal. Will monitor at home for goal <140/90.

## 2016-09-25 NOTE — Assessment & Plan Note (Signed)
Up-to-date on colonoscopy, mammogram. She is a history of hysterectomy and politely declines pelvic exam, Pap today based on preference which is understandable. Nonsmoker. Patient is up-to-date on immunizations. Screening labs done prior.  CBE performed.

## 2016-09-25 NOTE — Assessment & Plan Note (Signed)
Trial low dose zocor twice a week to see if tolerate. LDL> 70. Will follow.

## 2016-09-25 NOTE — Patient Instructions (Addendum)
BP goal < 140/90. Trial zocor twice per week to see if you  Tolerate.  Start victoza F/u 3 months                                                                                                                                                                                                                                                                                                                                              Health Maintenance, Female Introduction Adopting a healthy lifestyle and getting preventive care can go a long way to promote health and wellness. Talk with your health care provider about what schedule of regular examinations is right for you. This is a good chance for you to check in with your provider about disease prevention and staying healthy. In between checkups, there are plenty of things you can do on your own. Experts have done a lot of research about which lifestyle changes and preventive measures are most likely to keep you healthy. Ask your health care provider for more information. Weight and diet Eat a healthy diet  Be sure to include plenty of vegetables, fruits, low-fat dairy products, and lean protein.  Do not eat a lot of foods high in solid fats, added sugars, or salt.  Get regular exercise. This is one of the most important things you can do for your health.  Most adults should exercise for at least 150 minutes each week. The exercise should increase your heart rate and make you sweat (moderate-intensity exercise).  Most adults should also do strengthening exercises at least twice a week. This is in addition to the moderate-intensity exercise. Maintain a healthy weight  Body mass index (BMI) is a measurement that can be used to identify possible weight problems. It estimates body fat based on height and weight. Your health care provider can help determine your BMI and help you achieve or  maintain a healthy weight.  For females 72 years of age and  older:  A BMI below 18.5 is considered underweight.  A BMI of 18.5 to 24.9 is normal.  A BMI of 25 to 29.9 is considered overweight.  A BMI of 30 and above is considered obese. Watch levels of cholesterol and blood lipids  You should start having your blood tested for lipids and cholesterol at 59 years of age, then have this test every 5 years.  You may need to have your cholesterol levels checked more often if:  Your lipid or cholesterol levels are high.  You are older than 59 years of age.  You are at high risk for heart disease. Cancer screening Lung Cancer  Lung cancer screening is recommended for adults 68-39 years old who are at high risk for lung cancer because of a history of smoking.  A yearly low-dose CT scan of the lungs is recommended for people who:  Currently smoke.  Have quit within the past 15 years.  Have at least a 30-pack-year history of smoking. A pack year is smoking an average of one pack of cigarettes a day for 1 year.  Yearly screening should continue until it has been 15 years since you quit.  Yearly screening should stop if you develop a health problem that would prevent you from having lung cancer treatment. Breast Cancer  Practice breast self-awareness. This means understanding how your breasts normally appear and feel.  It also means doing regular breast self-exams. Let your health care provider know about any changes, no matter how small.  If you are in your 20s or 30s, you should have a clinical breast exam (CBE) by a health care provider every 1-3 years as part of a regular health exam.  If you are 27 or older, have a CBE every year. Also consider having a breast X-ray (mammogram) every year.  If you have a family history of breast cancer, talk to your health care provider about genetic screening.  If you are at high risk for breast cancer, talk to your health care provider about having an MRI and a mammogram every year.  Breast cancer  gene (BRCA) assessment is recommended for women who have family members with BRCA-related cancers. BRCA-related cancers include:  Breast.  Ovarian.  Tubal.  Peritoneal cancers.  Results of the assessment will determine the need for genetic counseling and BRCA1 and BRCA2 testing. Cervical Cancer  Your health care provider may recommend that you be screened regularly for cancer of the pelvic organs (ovaries, uterus, and vagina). This screening involves a pelvic examination, including checking for microscopic changes to the surface of your cervix (Pap test). You may be encouraged to have this screening done every 3 years, beginning at age 36.  For women ages 63-65, health care providers may recommend pelvic exams and Pap testing every 3 years, or they may recommend the Pap and pelvic exam, combined with testing for human papilloma virus (HPV), every 5 years. Some types of HPV increase your risk of cervical cancer. Testing for HPV may also be done on women of any age with unclear Pap test results.  Other health care providers may not recommend any screening for nonpregnant women who are considered low risk for pelvic cancer and who do not have symptoms. Ask your health care provider if a screening pelvic exam is right for you.  If you have had past treatment for cervical cancer or a condition that could lead to cancer, you  need Pap tests and screening for cancer for at least 20 years after your treatment. If Pap tests have been discontinued, your risk factors (such as having a new sexual partner) need to be reassessed to determine if screening should resume. Some women have medical problems that increase the chance of getting cervical cancer. In these cases, your health care provider may recommend more frequent screening and Pap tests. Colorectal Cancer  This type of cancer can be detected and often prevented.  Routine colorectal cancer screening usually begins at 60 years of age and continues  through 59 years of age.  Your health care provider may recommend screening at an earlier age if you have risk factors for colon cancer.  Your health care provider may also recommend using home test kits to check for hidden blood in the stool.  A small camera at the end of a tube can be used to examine your colon directly (sigmoidoscopy or colonoscopy). This is done to check for the earliest forms of colorectal cancer.  Routine screening usually begins at age 39.  Direct examination of the colon should be repeated every 5-10 years through 59 years of age. However, you may need to be screened more often if early forms of precancerous polyps or small growths are found. Skin Cancer  Check your skin from head to toe regularly.  Tell your health care provider about any new moles or changes in moles, especially if there is a change in a mole's shape or color.  Also tell your health care provider if you have a mole that is larger than the size of a pencil eraser.  Always use sunscreen. Apply sunscreen liberally and repeatedly throughout the day.  Protect yourself by wearing long sleeves, pants, a wide-brimmed hat, and sunglasses whenever you are outside. Heart disease, diabetes, and high blood pressure  High blood pressure causes heart disease and increases the risk of stroke. High blood pressure is more likely to develop in:  People who have blood pressure in the high end of the normal range (130-139/85-89 mm Hg).  People who are overweight or obese.  People who are African American.  If you are 41-20 years of age, have your blood pressure checked every 3-5 years. If you are 14 years of age or older, have your blood pressure checked every year. You should have your blood pressure measured twice-once when you are at a hospital or clinic, and once when you are not at a hospital or clinic. Record the average of the two measurements. To check your blood pressure when you are not at a hospital  or clinic, you can use:  An automated blood pressure machine at a pharmacy.  A home blood pressure monitor.  If you are between 45 years and 63 years old, ask your health care provider if you should take aspirin to prevent strokes.  Have regular diabetes screenings. This involves taking a blood sample to check your fasting blood sugar level.  If you are at a normal weight and have a low risk for diabetes, have this test once every three years after 59 years of age.  If you are overweight and have a high risk for diabetes, consider being tested at a younger age or more often. Preventing infection Hepatitis B  If you have a higher risk for hepatitis B, you should be screened for this virus. You are considered at high risk for hepatitis B if:  You were born in a country where hepatitis B is common.  Ask your health care provider which countries are considered high risk.  Your parents were born in a high-risk country, and you have not been immunized against hepatitis B (hepatitis B vaccine).  You have HIV or AIDS.  You use needles to inject street drugs.  You live with someone who has hepatitis B.  You have had sex with someone who has hepatitis B.  You get hemodialysis treatment.  You take certain medicines for conditions, including cancer, organ transplantation, and autoimmune conditions. Hepatitis C  Blood testing is recommended for:  Everyone born from [birth year] through 1964.  Anyone with known risk factors for hepatitis C. Sexually transmitted infections (STIs)  You should be screened for sexually transmitted infections (STIs) including gonorrhea and chlamydia if:  You are sexually active and are younger than 59 years of age.  You are older than 59 years of age and your health care provider tells you that you are at risk for this type of infection.  Your sexual activity has changed since you were last screened and you are at an increased risk for chlamydia or gonorrhea. Ask  your health care provider if you are at risk.  If you do not have HIV, but are at risk, it may be recommended that you take a prescription medicine daily to prevent HIV infection. This is called pre-exposure prophylaxis (PrEP). You are considered at risk if:  You are sexually active and do not regularly use condoms or know the HIV status of your partner(s).  You take drugs by injection.  You are sexually active with a partner who has HIV. Talk with your health care provider about whether you are at high risk of being infected with HIV. If you choose to begin PrEP, you should first be tested for HIV. You should then be tested every 3 months for as long as you are taking PrEP. Pregnancy  If you are premenopausal and you may become pregnant, ask your health care provider about preconception counseling.  If you may become pregnant, take 400 to 800 micrograms (mcg) of folic acid every day.  If you want to prevent pregnancy, talk to your health care provider about birth control (contraception). Osteoporosis and menopause  Osteoporosis is a disease in which the bones lose minerals and strength with aging. This can result in serious bone fractures. Your risk for osteoporosis can be identified using a bone density scan.  If you are 78 years of age or older, or if you are at risk for osteoporosis and fractures, ask your health care provider if you should be screened.  Ask your health care provider whether you should take a calcium or vitamin D supplement to lower your risk for osteoporosis.  Menopause may have certain physical symptoms and risks.  Hormone replacement therapy may reduce some of these symptoms and risks. Talk to your health care provider about whether hormone replacement therapy is right for you. Follow these instructions at home:  Schedule regular health, dental, and eye exams.  Stay current with your immunizations.  Do not use any tobacco products including cigarettes,  chewing tobacco, or electronic cigarettes.  If you are pregnant, do not drink alcohol.  If you are breastfeeding, limit how much and how often you drink alcohol.  Limit alcohol intake to no more than 1 drink per day for nonpregnant women. One drink equals 12 ounces of beer, 5 ounces of wine, or 1 ounces of hard liquor.  Do not use street drugs.  Do not share needles.  Ask your health care provider for help if you need support or information about quitting drugs.  Tell your health care provider if you often feel depressed.  Tell your health care provider if you have ever been abused or do not feel safe at home. This information is not intended to replace advice given to you by your health care provider. Make sure you discuss any questions you have with your health care provider. Document Released: 04/24/2011 Document Revised: 03/16/2016 Document Reviewed: 07/13/2015  2017 Elsevier

## 2016-09-25 NOTE — Assessment & Plan Note (Signed)
Uncontrolled. Had been on victoza in the past; will start again. F/u 3 months.

## 2016-09-28 ENCOUNTER — Telehealth: Payer: Self-pay | Admitting: *Deleted

## 2016-09-28 NOTE — Telephone Encounter (Signed)
Please advise 

## 2016-09-28 NOTE — Telephone Encounter (Signed)
Patient was prescribed a new blood sugar medication victoza ,that was costly . Patient requested a discount card or a different medication. Pt contact [phone]

## 2016-10-02 NOTE — Telephone Encounter (Signed)
Brittany [name],   Is there a prior auth that would help do anything? I looked on goodrx and it was $800 + .   Wanted to chat with you first prior to changing medication.Marland Kitchen.Marland Kitchen..Marland Kitchen

## 2016-10-03 ENCOUNTER — Telehealth: Payer: Self-pay | Admitting: Family

## 2016-10-03 NOTE — Telephone Encounter (Signed)
Call pt re: info of [facility] savings for victoza Per tany, can get 24 months worth of med for between $25-$100 per month supply depending on insurance and questionare pt fills out on the website [email or web address]

## 2016-10-03 NOTE — Telephone Encounter (Signed)
Information has been mailed

## 2016-10-03 NOTE — Telephone Encounter (Signed)
GAVE [name] INFORMATION

## 2016-10-09 ENCOUNTER — Other Ambulatory Visit: Payer: Self-pay | Admitting: Family Medicine

## 2016-10-09 DIAGNOSIS — M19041 Primary osteoarthritis, right hand: Secondary | ICD-10-CM

## 2016-10-10 NOTE — Telephone Encounter (Signed)
Patient requesting refill of Meloxicam to [facility].  

## 2016-10-11 NOTE — Telephone Encounter (Signed)
Patient called asking for the NP to refill her Meloxicam . I see where she has requested a refill from Dr. Carlynn PurlSowles as well for the same medication. She also stated that she can not take the simvastatin . She informed me that she is not able to move her legs after taking.

## 2016-10-13 NOTE — Telephone Encounter (Signed)
FYI

## 2016-10-17 ENCOUNTER — Telehealth: Payer: Self-pay | Admitting: Family

## 2016-10-17 DIAGNOSIS — M19041 Primary osteoarthritis, right hand: Secondary | ICD-10-CM

## 2016-10-17 MED ORDER — MELOXICAM 15 MG PO TABS: 15.0000 mg | ORAL_TABLET | Freq: Every day | ORAL | 1 refills | Status: DC

## 2016-10-17 NOTE — Telephone Encounter (Signed)
Call pt-  Please advise to  takes Mobic ( meloxicam) with FOOD since it is an anti-inflammatory Must also take with prilosec to protect the stomach as Mobic can cause a GI bleed or ulcer. Do no take over the counter aleve, motrin, advil etc as this is in the same class as Mobic.   Just so you are aware-  If we were to see any decline in kidney function in the future, we would have to discontinue this medication.   Please advise to try taking Zocor just twice a week. If she tolerates taking twice per week, then we can increase to 3 times per week.

## 2016-10-17 NOTE — Telephone Encounter (Signed)
Tried calling patient but was unable to reach patient or leave VM, due to it not being set up

## 2016-10-17 NOTE — Telephone Encounter (Signed)
Patient has been informed.

## 2016-12-22 ENCOUNTER — Other Ambulatory Visit: Payer: Self-pay

## 2016-12-22 MED ORDER — GLUCOSE BLOOD VI STRP: ORAL_STRIP | 1 refills | Status: DC

## 2016-12-27 ENCOUNTER — Encounter: Payer: Self-pay | Admitting: Family

## 2016-12-27 ENCOUNTER — Ambulatory Visit (INDEPENDENT_AMBULATORY_CARE_PROVIDER_SITE_OTHER): Payer: 59 | Admitting: Family

## 2016-12-27 VITALS — BP 132/90 | HR 115 | Temp 97.9°F | Ht 60.0 in | Wt 171.8 lb

## 2016-12-27 DIAGNOSIS — I10 Essential (primary) hypertension: Secondary | ICD-10-CM

## 2016-12-27 DIAGNOSIS — E1165 Type 2 diabetes mellitus with hyperglycemia: Secondary | ICD-10-CM | POA: Diagnosis not present

## 2016-12-27 DIAGNOSIS — IMO0002 Reserved for concepts with insufficient information to code with codable children: Secondary | ICD-10-CM

## 2016-12-27 DIAGNOSIS — E1140 Type 2 diabetes mellitus with diabetic neuropathy, unspecified: Secondary | ICD-10-CM | POA: Diagnosis not present

## 2016-12-27 LAB — POCT GLYCOSYLATED HEMOGLOBIN (HGB A1C): Hemoglobin A1C: 8.5

## 2016-12-27 MED ORDER — DULOXETINE HCL 30 MG PO CPEP: ORAL_CAPSULE | ORAL | 3 refills | Status: DC

## 2016-12-27 MED ORDER — PEN NEEDLES 31G X 5 MM MISC: 1.0000 "application " | Freq: Every day | 1 refills | Status: DC

## 2016-12-27 NOTE — Assessment & Plan Note (Addendum)
A1C not much improved. 8.5 today. Patient is not reliably taking Victoza. We jointly agreed she would aim to be more consistent with this medication prior to having a new agent to her regimen. D/ced nortriptyline. Trial of cymbalta. Discussed maintaining high vigilance and to let me know if more falls.

## 2016-12-27 NOTE — Patient Instructions (Addendum)
Wean off of  noritriptyline , take every other day for one week and then every 3rd day

## 2016-12-27 NOTE — Assessment & Plan Note (Signed)
Controlled. Advised patient that lisinopril is renal protective and recommended in context of DM. Will continue to discuss with patient at follow ups.

## 2016-12-27 NOTE — Progress Notes (Signed)
Pre visit review using our clinic review tool, if applicable. No additional management support is needed unless otherwise documented below in the visit note. 

## 2016-12-27 NOTE — Progress Notes (Signed)
Subjective:    Patient ID: Brittany [name], female    DOB: [birth year], 61 y.o.   MRN: [id number]  CC: Brittany [name] is a 61 y.o. female who presents today for follow up.   HPI: HTN- No longer on medication. Denies exertional chest pain or pressure, numbness or tingling radiating to left arm or jaw, palpitations, dizziness, frequent headaches, changes in vision, or shortness of breath.    DM- Missing days with victoza. Most fasting BS < 150.   DM neuropathy- describes numbness tingling and burning by the feet which has been contributing to falls lately. She denies any loss of balance headache, chest pain, shortness of breath, dizziness prior to fall. Tried gabapentin in the past but did not work. Currently on nortriptyline was not noticing a difference.      HISTORY:  Past Medical History:  Diagnosis Date  . Diabetes mellitus without complication (HCC)   . Hyperlipidemia   . Hypertension    pt states on medication as prevenative  . Obesity    Past Surgical History:  Procedure Laterality Date  . ABDOMINAL HYSTERECTOMY    . COLONOSCOPY WITH PROPOFOL N/A 06/25/2015   Procedure: COLONOSCOPY WITH PROPOFOL;  Surgeon: Brittany Miniumarren Wohl, MD;  [facility];  Service: Endoscopy;  Laterality: N/A;  Diabetic - oral meds  . ELBOW SURGERY Left   . HIP SURGERY Right    twice   Family History  Problem Relation Age of Onset  . Diabetes Mother   . Cancer Father     unknown; '90 % cancer'  . Diabetes Sister   . Diabetes Brother   . Breast cancer Cousin     unknown age at dx.    Allergies: Atorvastatin; Lisinopril; Metformin & diet manage prod; and Rosuvastatin Current Outpatient Prescriptions on File Prior to Visit  Medication Sig Dispense Refill  . aspirin 81 MG tablet Take 81 mg by mouth daily.    . Cholecalciferol (VITAMIN D) 2000 units CAPS Take 1 capsule (2,000 Units total) by mouth daily. 30 capsule   . co-enzyme Q-10 30 MG capsule Take 30 mg by mouth at bedtime.       . fenofibrate micronized (LOFIBRA) 134 MG capsule TAKE 1 CAPSULE (134 MG TOTAL) BY MOUTH DAILY BEFORE BREAKFAST. 90 capsule 1  . glimepiride (AMARYL) 4 MG tablet Take 1 tablet (4 mg total) by mouth daily with breakfast. 90 tablet 1  . glucose blood test strip PT uses one touch test strips. Check glucose bid 200 each 1  . liraglutide 18 MG/3ML SOPN Start 0.6 mg [place] qd x 1 week, then 1.2 mg [place] qd thereafter 6 mL 12  . meloxicam (MOBIC) 15 MG tablet Take 1 tablet (15 mg total) by mouth daily. 90 tablet 1  . omeprazole (PRILOSEC) 40 MG capsule Take 1 capsule (40 mg total) by mouth daily as needed. 90 capsule 1   No current facility-administered medications on file prior to visit.     Social History  Substance Use Topics  . Smoking status: Never Smoker  . Smokeless tobacco: Never Used  . Alcohol use 0.0 oz/week     Comment: 1 times a year-wine    Review of Systems  Constitutional: Negative for chills and fever.  Respiratory: Negative for cough.   Cardiovascular: Negative for chest pain and palpitations.  Gastrointestinal: Negative for nausea and vomiting.  Skin: Negative for wound.  Neurological: Positive for numbness. Negative for dizziness and headaches.      Objective:  BP 132/90   Pulse (!) 115   Temp 97.9 F (36.6 C) (Oral)   Ht 5' (1.524 m)   Wt 171 lb 12.8 oz (77.9 kg)   SpO2 95%   BMI 33.55 kg/m  BP Readings from Last 3 Encounters:  12/27/16 132/90  09/25/16 140/85  07/24/16 (!) 155/90   Wt Readings from Last 3 Encounters:  12/27/16 171 lb 12.8 oz (77.9 kg)  09/25/16 170 lb 12.8 oz (77.5 kg)  07/24/16 171 lb 6.4 oz (77.7 kg)    Physical Exam  Constitutional: She appears well-developed and well-nourished.  Eyes: Conjunctivae are normal.  Cardiovascular: Normal rate, regular rhythm, normal heart sounds and normal pulses.   Pulmonary/Chest: Effort normal and breath sounds normal. She has no wheezes. She has no rhonchi. She has no rales.  Neurological: She is  alert.  Skin: Skin is warm and dry.  Psychiatric: She has a normal mood and affect. Her speech is normal and behavior is normal. Thought content normal.  Vitals reviewed.      Assessment & Plan:   Problem List Items Addressed This Visit      Cardiovascular and Mediastinum   Essential hypertension    Controlled. Advised patient that lisinopril is renal protective and recommended in context of DM. Will continue to discuss with patient at follow ups.         Endocrine   Type 2 diabetes, uncontrolled, with neuropathy (HCC) - Primary    A1C not much improved. 8.5 today. Patient is not reliably taking Victoza. We jointly agreed she would aim to be more consistent with this medication prior to having a new agent to her regimen. D/ced nortriptyline. Trial of cymbalta. Discussed maintaining high vigilance and to let me know if more falls.       Relevant Medications   DULoxetine (CYMBALTA) 30 MG capsule   Other Relevant Orders   POCT HgB A1C (Completed)       I have discontinued Ms. Brittany [name]'s nortriptyline and simvastatin. I am also having her start on DULoxetine and Pen Needles. Additionally, I am having her maintain her aspirin, co-enzyme Q-10, omeprazole, Vitamin D, fenofibrate micronized, glimepiride, liraglutide, meloxicam, and glucose blood.   Meds ordered this encounter  Medications  . DULoxetine (CYMBALTA) 30 MG capsule    Sig: Take one 30 mg tablet by mouth once a day for the first week. Then increase to two 30 mg tablets ( total 60mg ) by mouth once daily.    Dispense:  60 capsule    Refill:  3    Order Specific Question:   Supervising Provider    Answer:   Brittany [name] [[id number]]  . Insulin Pen Needle (PEN NEEDLES) 31G X 5 MM MISC    Sig: 1 application by Does not apply route daily.    Dispense:  100 each    Refill:  1    Return precautions given.   Risks, benefits, and alternatives of the medications and treatment plan prescribed today were discussed, and patient  expressed understanding.   Education regarding symptom management and diagnosis given to patient on AVS.  Continue to follow with Brittany Plowman, FNP for routine health maintenance.   Brittany [name] and I agreed with plan.   Brittany Plowman, FNP

## 2016-12-29 NOTE — Progress Notes (Signed)
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  So everything was elevated. Please advise.

## 2017-01-01 ENCOUNTER — Telehealth: Payer: Self-pay | Admitting: Family

## 2017-01-01 DIAGNOSIS — I10 Essential (primary) hypertension: Secondary | ICD-10-CM

## 2017-01-01 NOTE — Telephone Encounter (Signed)
-----   Message from Bronwen BettersBrock T Booth, CMA sent at 12/29/2016  4:25 PM EST -----   ----- Message ----- From: Allegra GranaMargaret G Arnett, FNP Sent: 12/27/2016   9:06 AM To: Bronwen BettersBrock T Booth, CMA  Please call pt and her check her HR and BP at work. Pulse elevated today.

## 2017-01-01 NOTE — Telephone Encounter (Signed)
Patient has been informed.  Patient also stated she will check BP and HR at 1400 at work.  Patients blood sugars have been running high.

## 2017-01-01 NOTE — Telephone Encounter (Signed)
Call pt-  Sounds elevated due to stress of event  What is HR and BP at rest?   Please have her track and if persistently elevated > 140/90, she would need to start medication.

## 2017-01-02 NOTE — Telephone Encounter (Signed)
Patient requested a call at [phone]

## 2017-01-03 NOTE — Telephone Encounter (Signed)
Patient requested a call at [phone], Please call pt today.

## 2017-01-04 NOTE — Telephone Encounter (Signed)
Please call pt-  What has her BP and HR been running?  What about her blood sugars?  Please make a f/u appt for patient

## 2017-01-05 MED ORDER — LISINOPRIL 5 MG PO TABS: 5.0000 mg | ORAL_TABLET | Freq: Every day | ORAL | 0 refills | Status: DC

## 2017-01-05 NOTE — Telephone Encounter (Signed)
Call pt-  I need exact BP readings.   I refilled the SMALL dose of lisionpril- 5mg .   She may need to start there and if BP not at goal , < 140/90, we would double to 10mg  once per day.   Also she needs lab recheck.   She needs to check potassium with in 5 days of starting medication ( roughly) .  Is she starting med today?    advise her to have BMP drawn today or Monday latetst  Thanks.

## 2017-01-05 NOTE — Telephone Encounter (Signed)
Pt called back returning your call. Thank you!  Call pt @ [phone]

## 2017-01-05 NOTE — Telephone Encounter (Signed)
Spoke with patient states her BP is running 150/89 .  She needs refill on lisinopril . Looking in chart it looks like patient has Lisinopril listed as allergy , however she reported to me she was taking it.   She states her blood sugars have been running high.   . She doesn't have the record of heart rate.    She will call back with the recording of heart rate /BP and blood sugars.   I advised patient she needs to call back to schedule follow up appointment.

## 2017-01-05 NOTE — Telephone Encounter (Signed)
Spoke with patient states her BP reading is as follows  3/16 139/92 HR 92 3/14 128/84 HR 98 3/13 135/95 HR 94 0627           143/94 HR 94 100 3/12 163/96  HR 110 200 PM          143/97 HR 115 330 PM           123/85  HR 115 540 PM Blood Sugar Reading as follows 3/9 159 0943 Blood sugar 159 3/11 0824 Blood sugar 198 3/12 0834 Blood sugar  201 3/14 0615 Blood sugar 182  3/15 0600 Blood sugar 184 3/16 0615 Blood sugar 218 Patient states she stopped Lisinopril due to legs hurt and throat felt as it was closing up and wondered if it was coming from congestion.   Restarted back on Lisinopril since she never heard back form us.

## 2017-01-05 NOTE — Telephone Encounter (Signed)
She has appointment scheduled for 03/29/17 . She didn't complain of any throat problems or trouble swallowing after re-starting Lisinopril  .  She has only been back on the Lisinopril for 1 week.   Patient states she has congestion and phlegm in the cough in the am.  She really doesn't want to switch medications.   Do we need to move up to sooner appointment. .Marland Kitchen

## 2017-01-05 NOTE — Telephone Encounter (Signed)
She needs OV  If she thinks she has tongue swelling or most certainly throat closing up ( that is very scary) - I will call in amlopdine for BP and she can stop lisiinopril.  Please clarify with her.  If no, she can stay on lisionpirl and I will see her at follow up

## 2017-01-05 NOTE — Telephone Encounter (Signed)
Tried contacting patient voicemail not set up

## 2017-01-07 NOTE — Telephone Encounter (Signed)
Thank you  Mal AmabileBrock-  Please call and circle back with patient.   Please ensure she has BMP lab appt scheduled asap. I like to check within 5 days of starting lisinopril to ensure potassium is not elevated.   Otherwise, as long as BP is at goal < 140/90.   Has she missed any days with victoza? She told me had at last visit.   If not, likley we should add another agent to regimen.    Mal MistyKelsy,  What would you recommend to add? Last a1c 8.5, and 8.4.  Thanks for your advice!

## 2017-01-08 NOTE — Telephone Encounter (Signed)
Mal AmabileBrock, Can you add her to my schedule when you call?  I will further address the Blood pressure and diabetes  Thanks Hazle NordmannKelsy Combs, PharmD, BCPS [employer] PGY2 Pharmacy Resident [phone]

## 2017-01-09 NOTE — Telephone Encounter (Signed)
Patient has been informed. Also patient will have labs drawn tomorrow. Also she stated she will call thursday to schedule [facility].

## 2017-01-10 ENCOUNTER — Other Ambulatory Visit (INDEPENDENT_AMBULATORY_CARE_PROVIDER_SITE_OTHER): Payer: 59

## 2017-01-10 DIAGNOSIS — I10 Essential (primary) hypertension: Secondary | ICD-10-CM

## 2017-01-10 NOTE — Addendum Note (Signed)
Addended by: Warden FillersWRIGHT, LATOYA S on: 01/10/2017 01:49 PM   Modules accepted: Orders

## 2017-01-11 ENCOUNTER — Other Ambulatory Visit: Payer: Self-pay | Admitting: Family

## 2017-01-11 DIAGNOSIS — I10 Essential (primary) hypertension: Secondary | ICD-10-CM

## 2017-01-11 LAB — BASIC METABOLIC PANEL
BUN / CREAT RATIO: 36 — AB (ref 9–23)
BUN: 22 mg/dL (ref 6–24)
CO2: 22 mmol/L (ref 18–29)
Calcium: 9.4 mg/dL (ref 8.7–10.2)
Chloride: 103 mmol/L (ref 96–106)
Creatinine, Ser: 0.61 mg/dL (ref 0.57–1.00)
GFR calc non Af Amer: 100 mL/min/{1.73_m2} (ref >59)
GFR, EST AFRICAN AMERICAN: 115 mL/min/{1.73_m2} (ref >59)
Glucose: 130 mg/dL — ABNORMAL HIGH (ref 65–99)
Potassium: 4.2 mmol/L (ref 3.5–5.2)
Sodium: 143 mmol/L (ref 134–144)

## 2017-01-11 MED ORDER — LISINOPRIL 10 MG PO TABS: 10.0000 mg | ORAL_TABLET | Freq: Every day | ORAL | 1 refills | Status: DC

## 2017-01-22 ENCOUNTER — Telehealth: Payer: Self-pay | Admitting: Family

## 2017-01-22 NOTE — Telephone Encounter (Signed)
Call pt  Reviewing chart and no record of annual eye exam for dm  Has she had?  If yes, please update chart

## 2017-01-23 NOTE — Telephone Encounter (Signed)
Patient stated she has appointment in JUL 2018.

## 2017-01-31 ENCOUNTER — Other Ambulatory Visit: Payer: Self-pay | Admitting: Family

## 2017-01-31 DIAGNOSIS — E785 Hyperlipidemia, unspecified: Secondary | ICD-10-CM

## 2017-02-01 ENCOUNTER — Other Ambulatory Visit: Payer: Self-pay | Admitting: Family

## 2017-02-01 DIAGNOSIS — E1140 Type 2 diabetes mellitus with diabetic neuropathy, unspecified: Secondary | ICD-10-CM

## 2017-02-01 DIAGNOSIS — IMO0002 Reserved for concepts with insufficient information to code with codable children: Secondary | ICD-10-CM

## 2017-02-01 DIAGNOSIS — E1165 Type 2 diabetes mellitus with hyperglycemia: Principal | ICD-10-CM

## 2017-02-01 MED ORDER — DULOXETINE HCL 60 MG PO CPEP: 60.0000 mg | ORAL_CAPSULE | Freq: Every day | ORAL | 1 refills | Status: DC

## 2017-02-01 NOTE — Progress Notes (Unsigned)
Call pt-   Let her know that I refilled her cymbalta at  v  capsule after [facility] contacted [place] to let us know she would save money with the  tablet.

## 2017-02-01 NOTE — Progress Notes (Signed)
Called patient but was unable to leave VM due to mail box not being set up.

## 2017-02-01 NOTE — Progress Notes (Signed)
Patient has been informwed. Patient had no questions, comments, or concerns.

## 2017-02-14 ENCOUNTER — Other Ambulatory Visit: Payer: Self-pay | Admitting: Family

## 2017-02-14 DIAGNOSIS — E119 Type 2 diabetes mellitus without complications: Secondary | ICD-10-CM

## 2017-02-23 ENCOUNTER — Other Ambulatory Visit: Payer: Self-pay

## 2017-02-23 DIAGNOSIS — E119 Type 2 diabetes mellitus without complications: Secondary | ICD-10-CM

## 2017-02-23 MED ORDER — GLIMEPIRIDE 4 MG PO TABS: 4.0000 mg | ORAL_TABLET | Freq: Every day | ORAL | 1 refills | Status: DC

## 2017-02-23 NOTE — Telephone Encounter (Signed)
Medication has been refilled.

## 2017-03-29 ENCOUNTER — Encounter: Payer: Self-pay | Admitting: Family

## 2017-03-29 ENCOUNTER — Ambulatory Visit (INDEPENDENT_AMBULATORY_CARE_PROVIDER_SITE_OTHER): Payer: 59 | Admitting: Family

## 2017-03-29 VITALS — BP 130/80 | HR 79 | Temp 98.4°F | Ht 60.0 in | Wt 167.2 lb

## 2017-03-29 DIAGNOSIS — I10 Essential (primary) hypertension: Secondary | ICD-10-CM | POA: Diagnosis not present

## 2017-03-29 DIAGNOSIS — E1140 Type 2 diabetes mellitus with diabetic neuropathy, unspecified: Secondary | ICD-10-CM | POA: Diagnosis not present

## 2017-03-29 DIAGNOSIS — E1165 Type 2 diabetes mellitus with hyperglycemia: Secondary | ICD-10-CM | POA: Diagnosis not present

## 2017-03-29 DIAGNOSIS — IMO0002 Reserved for concepts with insufficient information to code with codable children: Secondary | ICD-10-CM

## 2017-03-29 NOTE — Assessment & Plan Note (Addendum)
Pending a1c. Only on amaryl at this time; will consider SLT2 ( jardiance)  depending on a1c. Will monitor hypoglyemia if on dual regimen.

## 2017-03-29 NOTE — Patient Instructions (Signed)
Pleasure seeing you  F/u 3 months

## 2017-03-29 NOTE — Progress Notes (Signed)
Subjective:    Patient ID: Brittany [name], female    DOB: 13-Jan-1956, 61 y.o.   MRN: [id number]  CC: Brittany [name] is a 61 y.o. female who presents today for follow up.   HPI: DM- stopped victoza because of 'knot on stomach.' Fasting BS today was 282. Trying to limiting soda.   Neuropathic pain- improved on cymbalta. Tolerating well.   HTN-  Compliant with lisinopril. Denies exertional chest pain or pressure, numbness or tingling radiating to left arm or jaw, palpitations, dizziness, frequent headaches, changes in vision, or shortness of breath.      HISTORY:  Past Medical History:  Diagnosis Date  . Diabetes mellitus without complication (HCC)   . Hyperlipidemia   . Hypertension    pt states on medication as prevenative  . Obesity    Past Surgical History:  Procedure Laterality Date  . ABDOMINAL HYSTERECTOMY    . COLONOSCOPY WITH PROPOFOL N/A 06/25/2015   Procedure: COLONOSCOPY WITH PROPOFOL;  Surgeon: Midge Miniumarren Wohl, MD;  [facility];  Service: Endoscopy;  Laterality: N/A;  Diabetic - oral meds  . ELBOW SURGERY Left   . HIP SURGERY Right    twice   Family History  Problem Relation Age of Onset  . Diabetes Mother   . Cancer Father        unknown; '90 % cancer'  . Diabetes Sister   . Diabetes Brother   . Breast cancer Cousin        unknown age at dx.    Allergies: Atorvastatin; Lisinopril; Metformin & diet manage prod; Rosuvastatin; and Victoza [liraglutide] Current Outpatient Prescriptions on File Prior to Visit  Medication Sig Dispense Refill  . aspirin 81 MG tablet Take 81 mg by mouth daily.    . Cholecalciferol (VITAMIN D) 2000 units CAPS Take 1 capsule (2,000 Units total) by mouth daily. 30 capsule   . co-enzyme Q-10 30 MG capsule Take 30 mg by mouth at bedtime.     . DULoxetine (CYMBALTA) 60 MG capsule Take 1 capsule (60 mg total) by mouth daily. 90 capsule 1  . fenofibrate micronized (LOFIBRA) 134 MG capsule TAKE 1 CAPSULE (134 MG TOTAL) BY  MOUTH DAILY BEFORE BREAKFAST. 90 capsule 1  . glimepiride (AMARYL) 4 MG tablet Take 1 tablet (4 mg total) by mouth daily with breakfast. 90 tablet 1  . glucose blood test strip PT uses one touch test strips. Check glucose bid 200 each 1  . Insulin Pen Needle (PEN NEEDLES) 31G X 5 MM MISC 1 application by Does not apply route daily. 100 each 1  . lisinopril (PRINIVIL,ZESTRIL) 10 MG tablet Take 1 tablet (10 mg total) by mouth daily. 90 tablet 1  . meloxicam (MOBIC) 15 MG tablet Take 1 tablet (15 mg total) by mouth daily. 90 tablet 1  . omeprazole (PRILOSEC) 40 MG capsule Take 1 capsule (40 mg total) by mouth daily as needed. 90 capsule 1   No current facility-administered medications on file prior to visit.     Social History  Substance Use Topics  . Smoking status: Never Smoker  . Smokeless tobacco: Never Used  . Alcohol use 0.0 oz/week     Comment: 1 times a year-wine    Review of Systems  Constitutional: Negative for chills and fever.  Eyes: Negative for visual disturbance.  Respiratory: Negative for cough.   Cardiovascular: Negative for chest pain and palpitations.  Gastrointestinal: Negative for nausea and vomiting.  Neurological: Negative for headaches.  Objective:    BP 130/80   Pulse 79   Temp 98.4 F (36.9 C) (Oral)   Ht 5' (1.524 m)   Wt 167 lb 3.2 oz (75.8 kg)   SpO2 96%   BMI 32.65 kg/m  BP Readings from Last 3 Encounters:  03/29/17 130/80  12/27/16 132/90  09/25/16 140/85   Wt Readings from Last 3 Encounters:  03/29/17 167 lb 3.2 oz (75.8 kg)  12/27/16 171 lb 12.8 oz (77.9 kg)  09/25/16 170 lb 12.8 oz (77.5 kg)    Physical Exam  Constitutional: She appears well-developed and well-nourished.  Eyes: Conjunctivae are normal.  Cardiovascular: Normal rate, regular rhythm, normal heart sounds and normal pulses.   Pulmonary/Chest: Effort normal and breath sounds normal. She has no wheezes. She has no rhonchi. She has no rales.  Neurological: She is  alert.  Skin: Skin is warm and dry.  Psychiatric: She has a normal mood and affect. Her speech is normal and behavior is normal. Thought content normal.  Vitals reviewed.      Assessment & Plan:   Problem List Items Addressed This Visit      Cardiovascular and Mediastinum   Essential hypertension - Primary    At goal; continue current regimen.       Relevant Orders   Basic metabolic panel     Endocrine   Type 2 diabetes, uncontrolled, with neuropathy (HCC)    Pending a1c. Only on amaryl at this time; will consider SLT2 ( jardiance)  depending on a1c. Will monitor hypoglyemia if on dual regimen.      Relevant Orders   Hemoglobin A1c       I have discontinued Ms. Bujak's liraglutide. I am also having her maintain her aspirin, co-enzyme Q-10, omeprazole, Vitamin D, fenofibrate micronized, meloxicam, glucose blood, Pen Needles, lisinopril, DULoxetine, and glimepiride.   No orders of the defined types were placed in this encounter.   Return precautions given.   Risks, benefits, and alternatives of the medications and treatment plan prescribed today were discussed, and patient expressed understanding.   Education regarding symptom management and diagnosis given to patient on AVS.  Continue to follow with Allegra Grana, FNP for routine health maintenance.   Brittany Milroy and I agreed with plan.   [name], FNP

## 2017-03-29 NOTE — Progress Notes (Signed)
Pre visit review using our clinic review tool, if applicable. No additional management support is needed unless otherwise documented below in the visit note. 

## 2017-03-29 NOTE — Assessment & Plan Note (Signed)
At goal continue current regimen. 

## 2017-03-30 LAB — COMPREHENSIVE METABOLIC PANEL
A/G RATIO: 1.5 (ref 1.2–2.2)
ALT: 38 IU/L — AB (ref 0–32)
AST: 22 IU/L (ref 0–40)
Albumin: 4.1 g/dL (ref 3.6–4.8)
Alkaline Phosphatase: 104 IU/L (ref 39–117)
BUN/Creatinine Ratio: 25 (ref 12–28)
BUN: 18 mg/dL (ref 8–27)
Bilirubin Total: 0.4 mg/dL (ref 0.0–1.2)
CALCIUM: 9.5 mg/dL (ref 8.7–10.3)
CO2: 23 mmol/L (ref 18–29)
CREATININE: 0.72 mg/dL (ref 0.57–1.00)
Chloride: 101 mmol/L (ref 96–106)
GFR, EST AFRICAN AMERICAN: 105 mL/min/{1.73_m2} (ref >59)
GFR, EST NON AFRICAN AMERICAN: 91 mL/min/{1.73_m2} (ref >59)
Globulin, Total: 2.7 g/dL (ref 1.5–4.5)
Glucose: 289 mg/dL — ABNORMAL HIGH (ref 65–99)
POTASSIUM: 4.9 mmol/L (ref 3.5–5.2)
Sodium: 140 mmol/L (ref 134–144)
TOTAL PROTEIN: 6.8 g/dL (ref 6.0–8.5)

## 2017-03-30 LAB — HEMOGLOBIN A1C
Est. average glucose Bld gHb Est-mCnc: 240 mg/dL
Hgb A1c MFr Bld: 10 % — ABNORMAL HIGH (ref 4.8–5.6)

## 2017-04-03 ENCOUNTER — Other Ambulatory Visit: Payer: Self-pay | Admitting: Family

## 2017-04-03 DIAGNOSIS — I10 Essential (primary) hypertension: Secondary | ICD-10-CM

## 2017-04-09 ENCOUNTER — Other Ambulatory Visit: Payer: Self-pay | Admitting: Family

## 2017-04-09 ENCOUNTER — Encounter: Payer: Self-pay | Admitting: Family

## 2017-04-09 DIAGNOSIS — IMO0002 Reserved for concepts with insufficient information to code with codable children: Secondary | ICD-10-CM

## 2017-04-09 DIAGNOSIS — E1140 Type 2 diabetes mellitus with diabetic neuropathy, unspecified: Secondary | ICD-10-CM

## 2017-04-09 DIAGNOSIS — E1165 Type 2 diabetes mellitus with hyperglycemia: Principal | ICD-10-CM

## 2017-04-09 MED ORDER — EMPAGLIFLOZIN 10 MG PO TABS: 10.0000 mg | ORAL_TABLET | Freq: Every morning | ORAL | 1 refills | Status: DC

## 2017-04-11 ENCOUNTER — Telehealth: Payer: Self-pay | Admitting: Family

## 2017-04-11 DIAGNOSIS — E1140 Type 2 diabetes mellitus with diabetic neuropathy, unspecified: Secondary | ICD-10-CM

## 2017-04-11 DIAGNOSIS — E1165 Type 2 diabetes mellitus with hyperglycemia: Principal | ICD-10-CM

## 2017-04-11 DIAGNOSIS — IMO0002 Reserved for concepts with insufficient information to code with codable children: Secondary | ICD-10-CM

## 2017-04-11 MED ORDER — EMPAGLIFLOZIN 10 MG PO TABS: 10.0000 mg | ORAL_TABLET | Freq: Every morning | ORAL | 1 refills | Status: DC

## 2017-04-11 NOTE — Telephone Encounter (Signed)
Pt called and stated that she was supposed to start a new medication empagliflozin (JARDIANCE) 10 MG TABS tablet but it has not yet been sent to the pharmacy. Please advise, thank you!  [id number] [place], [place] - [address]

## 2017-04-11 NOTE — Telephone Encounter (Signed)
Medication has been resent.  Also the pharmacy wants us to try something else due to the insurance not covering original rx.  Patient is going to try and get jardiance with coupon. If unable to do so we may need to try something different.

## 2017-04-11 NOTE — Telephone Encounter (Signed)
Noted  Let me know if we need to try something else.

## 2017-05-09 ENCOUNTER — Ambulatory Visit (INDEPENDENT_AMBULATORY_CARE_PROVIDER_SITE_OTHER): Payer: 59 | Admitting: Family

## 2017-05-09 ENCOUNTER — Encounter: Payer: Self-pay | Admitting: Family

## 2017-05-09 VITALS — BP 112/70 | HR 76 | Temp 97.9°F | Resp 16 | Ht 61.0 in | Wt 162.1 lb

## 2017-05-09 DIAGNOSIS — E1140 Type 2 diabetes mellitus with diabetic neuropathy, unspecified: Secondary | ICD-10-CM | POA: Diagnosis not present

## 2017-05-09 DIAGNOSIS — I10 Essential (primary) hypertension: Secondary | ICD-10-CM

## 2017-05-09 DIAGNOSIS — E1165 Type 2 diabetes mellitus with hyperglycemia: Secondary | ICD-10-CM | POA: Diagnosis not present

## 2017-05-09 DIAGNOSIS — IMO0002 Reserved for concepts with insufficient information to code with codable children: Secondary | ICD-10-CM

## 2017-05-09 MED ORDER — METFORMIN HCL ER 500 MG PO TB24: ORAL_TABLET | ORAL | 3 refills | Status: DC

## 2017-05-09 NOTE — Patient Instructions (Addendum)
Start metformin 500mg  in the evening  Please increase metformin 500mg  ER by 500 mg every evening as tolerated.   Max dose is 2000 mg per day ER.  Follow up in 2 months   Carbohydrate Counting for Diabetes Mellitus, Adult Carbohydrate counting is a method for keeping track of how many carbohydrates you eat. Eating carbohydrates naturally increases the amount of sugar (glucose) in the blood. Counting how many carbohydrates you eat helps keep your blood glucose within normal limits, which helps you manage your diabetes (diabetes mellitus). It is important to know how many carbohydrates you can safely have in each meal. This is different for every person. A diet and nutrition specialist (registered dietitian) can help you make a meal plan and calculate how many carbohydrates you should have at each meal and snack. Carbohydrates are found in the following foods:  Grains, such as breads and cereals.  Dried beans and soy products.  Starchy vegetables, such as potatoes, peas, and corn.  Fruit and fruit juices.  Milk and yogurt.  Sweets and snack foods, such as cake, cookies, candy, chips, and soft drinks.  How do I count carbohydrates? There are two ways to count carbohydrates in food. You can use either of the methods or a combination of both. Reading "Nutrition Facts" on packaged food The "Nutrition Facts" list is included on the labels of almost all packaged foods and beverages in the [place] It includes:  The serving size.  Information about nutrients in each serving, including the grams (g) of carbohydrate per serving.  To use the "Nutrition Facts":  Decide how many servings you will have.  Multiply the number of servings by the number of carbohydrates per serving.  The resulting number is the total amount of carbohydrates that you will be having.  Learning standard serving sizes of other foods When you eat foods containing carbohydrates that are not packaged or do not include  "Nutrition Facts" on the label, you need to measure the servings in order to count the amount of carbohydrates:  Measure the foods that you will eat with a food scale or measuring cup, if needed.  Decide how many standard-size servings you will eat.  Multiply the number of servings by 15. Most carbohydrate-rich foods have about 15 g of carbohydrates per serving. ? For example, if you eat 8 oz (170 g) of strawberries, you will have eaten 2 servings and 30 g of carbohydrates (2 servings x 15 g = 30 g).  For foods that have more than one food mixed, such as soups and casseroles, you must count the carbohydrates in each food that is included.  The following list contains standard serving sizes of common carbohydrate-rich foods. Each of these servings has about 15 g of carbohydrates:   hamburger bun or  English muffin.   oz (15 mL) syrup.   oz (14 g) jelly.  1 slice of bread.  1 six-inch tortilla.  3 oz (85 g) cooked rice or pasta.  4 oz (113 g) cooked dried beans.  4 oz (113 g) starchy vegetable, such as peas, corn, or potatoes.  4 oz (113 g) hot cereal.  4 oz (113 g) mashed potatoes or  of a large baked potato.  4 oz (113 g) canned or frozen fruit.  4 oz (120 mL) fruit juice.  4-6 crackers.  6 chicken nuggets.  6 oz (170 g) unsweetened dry cereal.  6 oz (170 g) plain fat-free yogurt or yogurt sweetened with artificial sweeteners.  8 oz (  240 mL) milk.  8 oz (170 g) fresh fruit or one small piece of fruit.  24 oz (680 g) popped popcorn.  Example of carbohydrate counting Sample meal  3 oz (85 g) chicken breast.  6 oz (170 g) brown rice.  4 oz (113 g) corn.  8 oz (240 mL) milk.  8 oz (170 g) strawberries with sugar-free whipped topping. Carbohydrate calculation 1. Identify the foods that contain carbohydrates: ? Rice. ? Corn. ? Milk. ? Strawberries. 2. Calculate how many servings you have of each food: ? 2 servings rice. ? 1 serving corn. ? 1  serving milk. ? 1 serving strawberries. 3. Multiply each number of servings by 15 g: ? 2 servings rice x 15 g = 30 g. ? 1 serving corn x 15 g = 15 g. ? 1 serving milk x 15 g = 15 g. ? 1 serving strawberries x 15 g = 15 g. 4. Add together all of the amounts to find the total grams of carbohydrates eaten: ? 30 g + 15 g + 15 g + 15 g = 75 g of carbohydrates total. This information is not intended to replace advice given to you by your health care provider. Make sure you discuss any questions you have with your health care provider. Document Released: 10/09/2005 Document Revised: 04/28/2016 Document Reviewed: 03/22/2016 Elsevier Interactive Patient Education  [employer]2018 [employer]vier Inc.

## 2017-05-09 NOTE — Progress Notes (Signed)
Subjective:    Patient ID: Brittany [name], female    DOB: [birth year], 61 y.o.   MRN: [id number]  CC: Brittany [name] is a 61 y.o. female who presents today for follow up.   HPI: DM- stopped jardiance because of muscle cramps and dizziness- which resolved once stopped 5 days ago. On amaryl. Fasting blood sugar in morning 152.  Has pitfalls with candy and cake.        HISTORY:  Past Medical History:  Diagnosis Date  . Diabetes mellitus without complication (HCC)   . Hyperlipidemia   . Hypertension    pt states on medication as prevenative  . Obesity    Past Surgical History:  Procedure Laterality Date  . ABDOMINAL HYSTERECTOMY    . COLONOSCOPY WITH PROPOFOL N/A 06/25/2015   Procedure: COLONOSCOPY WITH PROPOFOL;  Surgeon: Midge Minium, MD;  Location: [facility];  Service: Endoscopy;  Laterality: N/A;  Diabetic - oral meds  . ELBOW SURGERY Left   . HIP SURGERY Right    twice   Family History  Problem Relation Age of Onset  . Diabetes Mother   . Cancer Father        unknown; '90 % cancer'  . Diabetes Sister   . Diabetes Brother   . Breast cancer Cousin        unknown age at dx.    Allergies: Atorvastatin; Jardiance [empagliflozin]; Metformin & diet manage prod; Rosuvastatin; and Victoza [liraglutide] Current Outpatient Prescriptions on File Prior to Visit  Medication Sig Dispense Refill  . aspirin 81 MG tablet Take 81 mg by mouth daily.    . DULoxetine (CYMBALTA) 60 MG capsule Take 1 capsule (60 mg total) by mouth daily. 90 capsule 1  . glimepiride (AMARYL) 4 MG tablet Take 1 tablet (4 mg total) by mouth daily with breakfast. 90 tablet 1  . glucose blood test strip PT uses one touch test strips. Check glucose bid 200 each 1  . lisinopril (PRINIVIL,ZESTRIL) 10 MG tablet Take 1 tablet (10 mg total) by mouth daily. 90 tablet 1  . meloxicam (MOBIC) 15 MG tablet Take 1 tablet (15 mg total) by mouth daily. 90 tablet 1  . omeprazole (PRILOSEC) 40 MG capsule Take 1  capsule (40 mg total) by mouth daily as needed. 90 capsule 1  . fenofibrate micronized (LOFIBRA) 134 MG capsule TAKE 1 CAPSULE (134 MG TOTAL) BY MOUTH DAILY BEFORE BREAKFAST. (Patient not taking: Reported on 05/09/2017) 90 capsule 1   No current facility-administered medications on file prior to visit.     Social History  Substance Use Topics  . Smoking status: Never Smoker  . Smokeless tobacco: Never Used  . Alcohol use 0.0 oz/week     Comment: 1 times a year-wine    Review of Systems  Constitutional: Negative for chills and fever.  Respiratory: Negative for cough.   Cardiovascular: Negative for chest pain and palpitations.  Gastrointestinal: Negative for nausea and vomiting.      Objective:    BP 112/70   Pulse 76   Temp 97.9 F (36.6 C) (Oral)   Resp 16   Ht 5\' 1"  (1.549 m)   Wt 162 lb 1.9 oz (73.5 kg)   SpO2 98%   BMI 30.63 kg/m  BP Readings from Last 3 Encounters:  05/09/17 112/70  03/29/17 130/80  12/27/16 132/90   Wt Readings from Last 3 Encounters:  05/09/17 162 lb 1.9 oz (73.5 kg)  03/29/17 167 lb 3.2 oz (75.8 kg)  12/27/16  171 lb 12.8 oz (77.9 kg)    Physical Exam  Constitutional: She appears well-developed and well-nourished.  Eyes: Conjunctivae are normal.  Cardiovascular: Normal rate, regular rhythm, normal heart sounds and normal pulses.   Pulmonary/Chest: Effort normal and breath sounds normal. She has no wheezes. She has no rhonchi. She has no rales.  Neurological: She is alert.  Skin: Skin is warm and dry.  Psychiatric: She has a normal mood and affect. Her speech is normal and behavior is normal. Thought content normal.  Vitals reviewed.      Assessment & Plan:   Problem List Items Addressed This Visit      Cardiovascular and Mediastinum   Essential hypertension    Well controlled. Continue current regimen        Endocrine   Type 2 diabetes, uncontrolled, with neuropathy (HCC) - Primary    Poor control. Recent a1c 10. Unable to  tolerate jardiance. Will go back and trial metformin ER with titration based on tolerance to 2000mg  max per day. Referral to nutrition. Follow up 2 months.      Relevant Medications   metFORMIN (GLUCOPHAGE XR) 500 MG 24 hr tablet   Other Relevant Orders   Referral to Nutrition and Diabetes Services       I have discontinued Brittany [name]'s co-enzyme Q-10, Vitamin D, Pen Needles, and empagliflozin. I am also having her start on metFORMIN. Additionally, I am having her maintain her aspirin, omeprazole, fenofibrate micronized, meloxicam, glucose blood, lisinopril, DULoxetine, and glimepiride.   Meds ordered this encounter  Medications  . metFORMIN (GLUCOPHAGE XR) 500 MG 24 hr tablet    Sig: Start 500mg  PO qpm.    Dispense:  90 tablet    Refill:  3    Order Specific Question:   Supervising Provider    Answer:   Sherlene ShamsULLO, TERESA L [[id number]]    Return precautions given.   Risks, benefits, and alternatives of the medications and treatment plan prescribed today were discussed, and patient expressed understanding.   Education regarding symptom management and diagnosis given to patient on AVS.  Continue to follow with Allegra GranaArnett, Ma Munoz G, FNP for routine health maintenance.   Chancy MilroyKaren J Faughnan and I agreed with plan.   Rennie PlowmanMargaret Daviona Herbert, FNP

## 2017-05-09 NOTE — Assessment & Plan Note (Signed)
Poor control. Recent a1c 10. Unable to tolerate jardiance. Will go back and trial metformin ER with titration based on tolerance to 2000mg  max per day. Referral to nutrition. Follow up 2 months.

## 2017-05-09 NOTE — Assessment & Plan Note (Signed)
Well-controlled.  Continue current regimen. 

## 2017-05-10 ENCOUNTER — Other Ambulatory Visit: Payer: Self-pay

## 2017-05-10 ENCOUNTER — Telehealth: Payer: Self-pay | Admitting: Family

## 2017-05-10 DIAGNOSIS — E1140 Type 2 diabetes mellitus with diabetic neuropathy, unspecified: Secondary | ICD-10-CM

## 2017-05-10 DIAGNOSIS — IMO0002 Reserved for concepts with insufficient information to code with codable children: Secondary | ICD-10-CM

## 2017-05-10 DIAGNOSIS — K2100 Gastro-esophageal reflux disease with esophagitis, without bleeding: Secondary | ICD-10-CM

## 2017-05-10 DIAGNOSIS — E1165 Type 2 diabetes mellitus with hyperglycemia: Principal | ICD-10-CM

## 2017-05-10 MED ORDER — OMEPRAZOLE 40 MG PO CPDR: 40.0000 mg | DELAYED_RELEASE_CAPSULE | Freq: Every day | ORAL | 1 refills | Status: DC | PRN

## 2017-05-10 MED ORDER — METFORMIN HCL ER 500 MG PO TB24: ORAL_TABLET | ORAL | 3 refills | Status: DC

## 2017-05-10 NOTE — Telephone Encounter (Signed)
Please advise 

## 2017-05-10 NOTE — Telephone Encounter (Signed)
See new rx 

## 2017-05-10 NOTE — Telephone Encounter (Signed)
Pt called and stated that we need to resend her metformin in with the directions that M. Arnett gave the pt because the insurance company will only fill 30 days. Please advise, thank you!  [facility] [id number] [place], [place] - [id number]  Call pt @ [phone]

## 2017-05-14 ENCOUNTER — Telehealth: Payer: Self-pay | Admitting: Family

## 2017-05-14 NOTE — Telephone Encounter (Signed)
Pt called wantis to know if she is still supposed to take fenofibrate micronized (LOFIBRA) 134 MG capsule. Pt wanted to know if it was switched to the Lipitor? Please advise?  Call pt @ [phone]. Thank you!

## 2017-05-14 NOTE — Telephone Encounter (Signed)
Please advise 

## 2017-05-17 MED ORDER — ATORVASTATIN CALCIUM 10 MG PO TABS: 10.0000 mg | ORAL_TABLET | Freq: Every day | ORAL | 0 refills | Status: DC

## 2017-05-17 NOTE — Telephone Encounter (Signed)
patient contacted and stated awareness 

## 2017-05-17 NOTE — Telephone Encounter (Signed)
Call pt  I would advise pt to take fenofibrate since she is statin intolerant.  With DM, please inform pt goal for LDL is 70 which is currently 130.

## 2017-05-17 NOTE — Telephone Encounter (Signed)
Sorry  Please add lipitor to list and she may stop fenofibrate ( dc from med list)

## 2017-05-17 NOTE — Telephone Encounter (Signed)
Stated that you advise her to take fenofibrate and she said she is confused that she is taking Lipitor 10, wants to know if she is to continue taking the Lipitor and the fenofibrate

## 2017-06-13 LAB — HM DIABETES EYE EXAM

## 2017-06-25 IMAGING — MG MM DIGITAL SCREENING BILAT W/ CAD
5 series · 5 of 5 positions shown · non-contrast
Comparison: Previous exam(s).

CLINICAL DATA: Screening.

EXAM:
DIGITAL SCREENING BILATERAL MAMMOGRAM WITH CAD

[L MLO (1 of 2)]
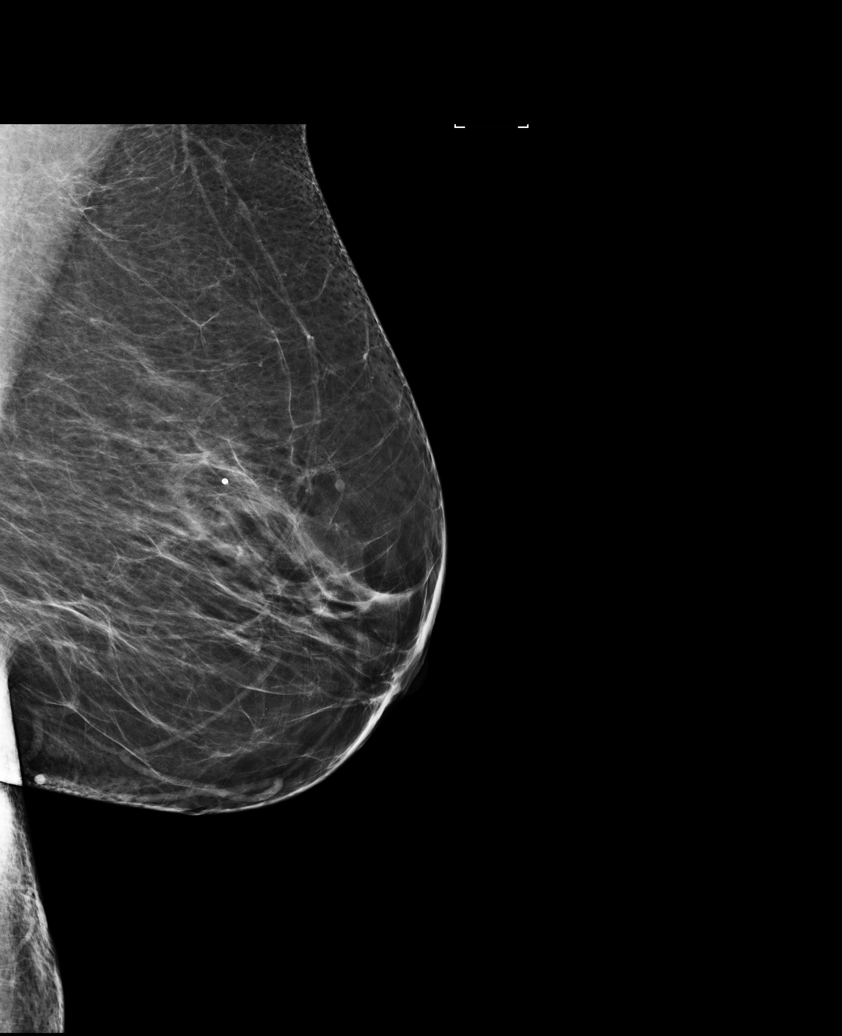

[R CC]
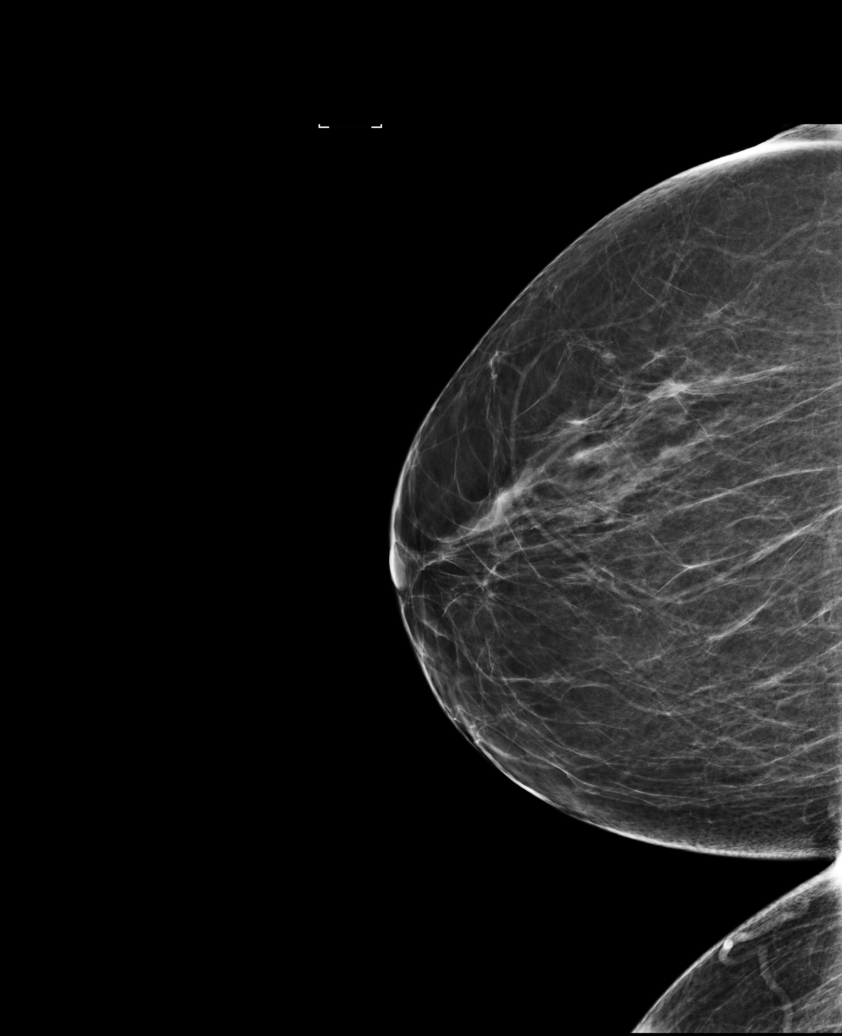

[L CC]
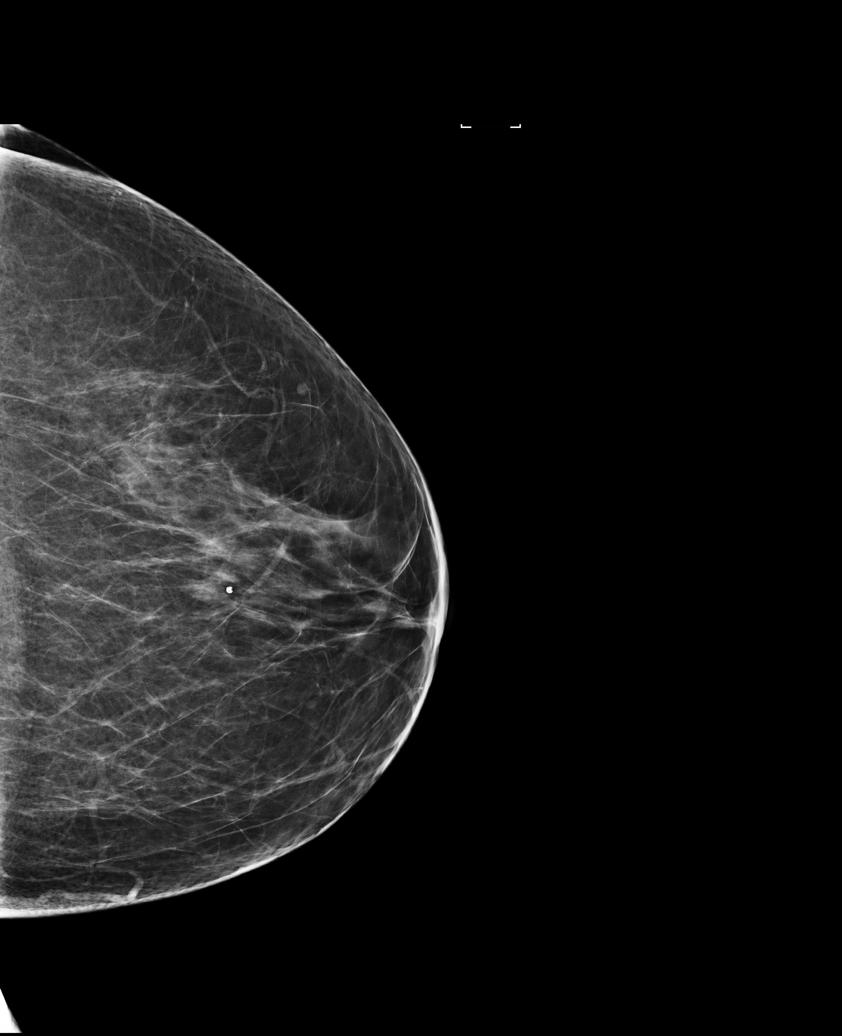

[L MLO (2 of 2)]
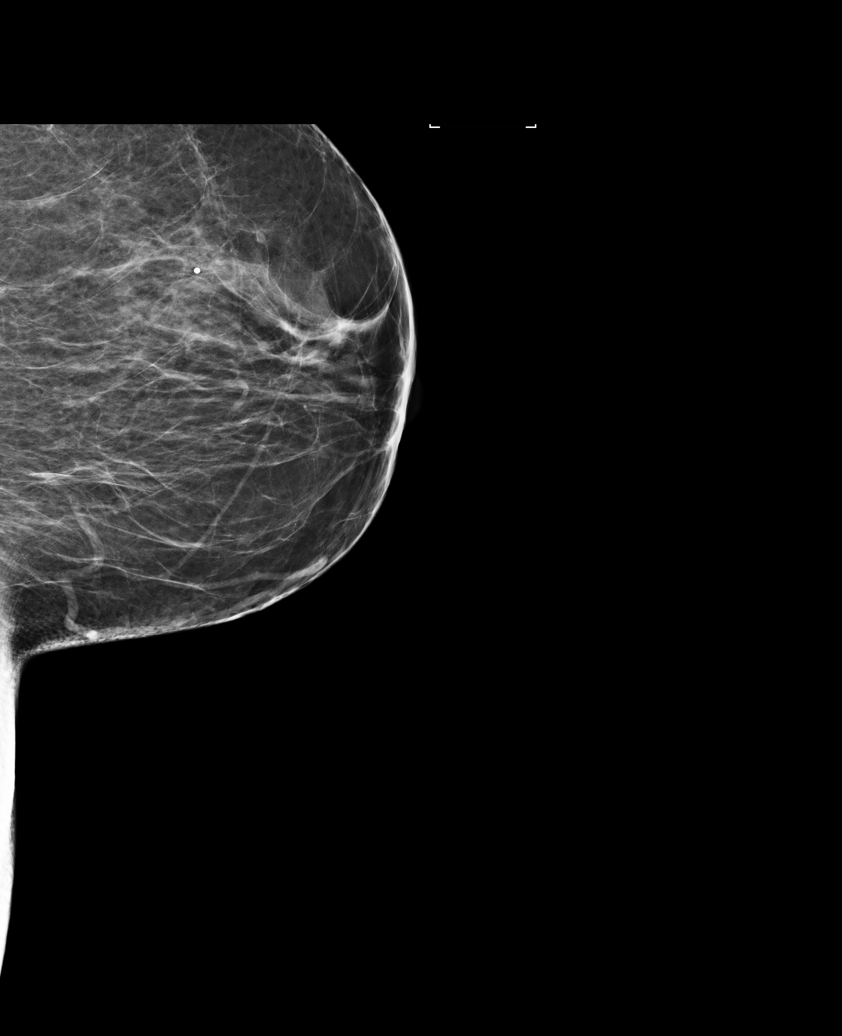

[R MLO]
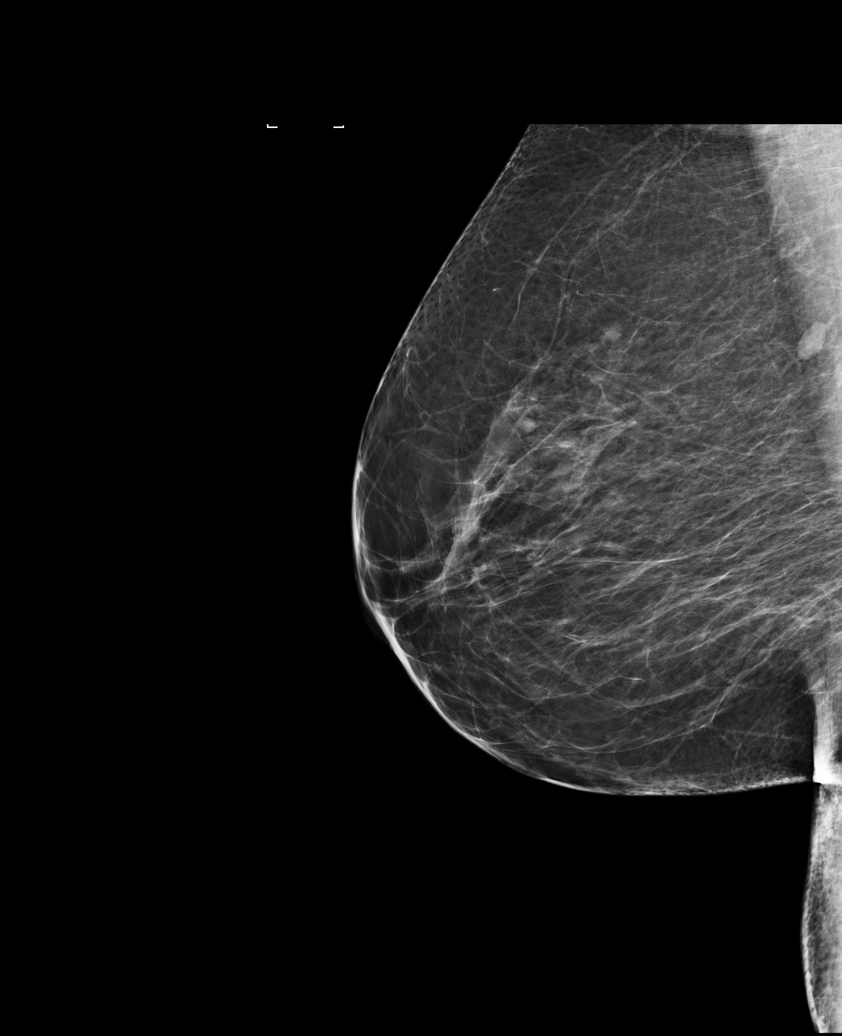

[5 of 5 positions shown; findings below may reference images not displayed]

ACR Breast Density Category b: There are scattered areas of
fibroglandular density.
FINDINGS: There are no findings suspicious for malignancy. Images were
processed with CAD.
IMPRESSION: No mammographic evidence of malignancy. A result letter of this
screening mammogram will be mailed directly to the patient.

RECOMMENDATION:
Screening mammogram in one year. (Code:AS-G-LCT)

BI-RADS CATEGORY  1: Negative.

## 2017-07-03 ENCOUNTER — Ambulatory Visit (INDEPENDENT_AMBULATORY_CARE_PROVIDER_SITE_OTHER): Payer: 59 | Admitting: Family

## 2017-07-03 ENCOUNTER — Encounter: Payer: Self-pay | Admitting: Family

## 2017-07-03 ENCOUNTER — Ambulatory Visit
Admission: RE | Admit: 2017-07-03 | Discharge: 2017-07-03 | Disposition: A | Discharge status: Home / Self Care | Payer: 59 | Source: Ambulatory Visit | Attending: Family | Admitting: Family

## 2017-07-03 VITALS — BP 138/78 | HR 94 | Temp 98.2°F | Ht 61.0 in | Wt 162.6 lb

## 2017-07-03 DIAGNOSIS — E785 Hyperlipidemia, unspecified: Secondary | ICD-10-CM

## 2017-07-03 DIAGNOSIS — M79606 Pain in leg, unspecified: Secondary | ICD-10-CM | POA: Insufficient documentation

## 2017-07-03 DIAGNOSIS — Z1231 Encounter for screening mammogram for malignant neoplasm of breast: Secondary | ICD-10-CM

## 2017-07-03 DIAGNOSIS — E1140 Type 2 diabetes mellitus with diabetic neuropathy, unspecified: Secondary | ICD-10-CM | POA: Diagnosis not present

## 2017-07-03 DIAGNOSIS — IMO0002 Reserved for concepts with insufficient information to code with codable children: Secondary | ICD-10-CM

## 2017-07-03 DIAGNOSIS — K219 Gastro-esophageal reflux disease without esophagitis: Secondary | ICD-10-CM

## 2017-07-03 DIAGNOSIS — M25552 Pain in left hip: Secondary | ICD-10-CM | POA: Diagnosis not present

## 2017-07-03 DIAGNOSIS — Z1239 Encounter for other screening for malignant neoplasm of breast: Secondary | ICD-10-CM

## 2017-07-03 DIAGNOSIS — E1165 Type 2 diabetes mellitus with hyperglycemia: Secondary | ICD-10-CM | POA: Diagnosis not present

## 2017-07-03 DIAGNOSIS — M79662 Pain in left lower leg: Secondary | ICD-10-CM | POA: Diagnosis not present

## 2017-07-03 DIAGNOSIS — I10 Essential (primary) hypertension: Secondary | ICD-10-CM

## 2017-07-03 NOTE — Patient Instructions (Addendum)
Images today  Trial capsaicin creme.   Labs  Follow up in 3 months  Pleasure seeing you, always!

## 2017-07-03 NOTE — Progress Notes (Signed)
Patient notified

## 2017-07-03 NOTE — Progress Notes (Signed)
Subjective:    Patient ID: Brittany MilroyKaren J Angst, female    DOB: [birth year], 61 y.o.   MRN: [id number]  CC: Brittany MilroyKaren J Sanderson is a 61 y.o. female who presents today for follow up.   HPI: DM- doing well on metformin. NUmbness tingling bilateral feet for years. Gabapentin didn't help.   HTN- compliant with medication, Denies exertional chest pain or pressure, numbness or tingling radiating to left arm or jaw, palpitations, dizziness, frequent headaches, changes in vision, or shortness of breath.    H/o right hip replacement. Chronic low back ache when standing. Notes pain on left side at hip and left lower leg, worsening, 1 year, constant, worse after standing. No pain in groin.  No numbness, tingling. No pain leg today. ACE wrap helps. NO fever, rash, swelling. No injury.  GERD- takes prilosec as needed. Periods where doesn't need medication  HLD- compliant with medications. Tolerating well.     HISTORY:  Past Medical History:  Diagnosis Date  . Diabetes mellitus without complication (HCC)   . Hyperlipidemia   . Hypertension    pt states on medication as prevenative  . Obesity    Past Surgical History:  Procedure Laterality Date  . ABDOMINAL HYSTERECTOMY    . COLONOSCOPY WITH PROPOFOL N/A 06/25/2015   Procedure: COLONOSCOPY WITH PROPOFOL;  Surgeon: Midge Miniumarren Wohl, MD;  [facility];  Service: Endoscopy;  Laterality: N/A;  Diabetic - oral meds  . ELBOW SURGERY Left   . HIP SURGERY Right    twice   Family History  Problem Relation Age of Onset  . Diabetes Mother   . Cancer Father        unknown; '90 % cancer'  . Diabetes Sister   . Diabetes Brother   . Breast cancer Cousin        unknown age at dx.    Allergies: Atorvastatin; Jardiance [empagliflozin]; Metformin & diet manage prod; Rosuvastatin; and Victoza [liraglutide] Current Outpatient Prescriptions on File Prior to Visit  Medication Sig Dispense Refill  . aspirin 81 MG tablet Take 81 mg by mouth daily.    Marland Kitchen.  atorvastatin (LIPITOR) 10 MG tablet Take 1 tablet (10 mg total) by mouth daily. 90 tablet 0  . DULoxetine (CYMBALTA) 60 MG capsule Take 1 capsule (60 mg total) by mouth daily. 90 capsule 1  . fenofibrate micronized (LOFIBRA) 134 MG capsule TAKE 1 CAPSULE (134 MG TOTAL) BY MOUTH DAILY BEFORE BREAKFAST. 90 capsule 1  . glimepiride (AMARYL) 4 MG tablet Take 1 tablet (4 mg total) by mouth daily with breakfast. 90 tablet 1  . glucose blood test strip PT uses one touch test strips. Check glucose bid 200 each 1  . lisinopril (PRINIVIL,ZESTRIL) 10 MG tablet Take 1 tablet (10 mg total) by mouth daily. 90 tablet 1  . meloxicam (MOBIC) 15 MG tablet Take 1 tablet (15 mg total) by mouth daily. 90 tablet 1  . metFORMIN (GLUCOPHAGE XR) 500 MG 24 hr tablet Start 500mg  PO qpm. increase by 500mg  PO every week as tolerated.  Max total daily dose is 2000 mg. 90 tablet 3  . omeprazole (PRILOSEC) 40 MG capsule Take 1 capsule (40 mg total) by mouth daily as needed. 90 capsule 1   No current facility-administered medications on file prior to visit.     Social History  Substance Use Topics  . Smoking status: Never Smoker  . Smokeless tobacco: Never Used  . Alcohol use 0.0 oz/week     Comment: 1 times a year-wine  Review of Systems  Constitutional: Negative for chills and fever.  Respiratory: Negative for cough.   Cardiovascular: Negative for chest pain and palpitations.  Gastrointestinal: Negative for nausea and vomiting.  Musculoskeletal: Positive for back pain. Negative for joint swelling.  Neurological: Positive for numbness.      Objective:    BP 138/78   Pulse 94   Temp 98.2 F (36.8 C) (Oral)   Ht  (1.549 m)   Wt 162 lb 9.6 oz (73.8 kg)   SpO2 97%   BMI 30.72 kg/m  BP Readings from Last 3 Encounters:  07/03/17 138/78  05/09/17 112/70  03/29/17 130/80   Wt Readings from Last 3 Encounters:  07/03/17 162 lb 9.6 oz (73.8 kg)  05/09/17 162 lb 1.9 oz (73.5 kg)  03/29/17 167 lb 3.2 oz  (75.8 kg)    Physical Exam  Constitutional: She appears well-developed and well-nourished.  Eyes: Conjunctivae are normal.  Cardiovascular: Normal rate, regular rhythm, normal heart sounds and normal pulses.   Denies exertional chest pain or pressure, numbness or tingling radiating to left arm or jaw, palpitations, dizziness, frequent headaches, changes in vision, or shortness of breath.    Pulmonary/Chest: Effort normal and breath sounds normal. She has no wheezes. She has no rhonchi. She has no rales.  Musculoskeletal:       Left hip: She exhibits normal range of motion, normal strength, no tenderness, no bony tenderness and no swelling.       Lumbar back: She exhibits normal range of motion, no tenderness, no bony tenderness, no pain and no spasm.       Left lower leg: She exhibits no tenderness, no bony tenderness, no swelling and no edema.  Neurological: She is alert.  Decreased sensation soles of bilateral feet.   Skin: Skin is warm and dry.  No wounds on feet.   Psychiatric: She has a normal mood and affect. Her speech is normal and behavior is normal. Thought content normal.  Vitals reviewed.      Assessment & Plan:   Problem List Items Addressed This Visit      Cardiovascular and Mediastinum   Essential hypertension    Controlled on current regimen. Will continue.       Relevant Orders   Comprehensive metabolic panel     Digestive   GERD (gastroesophageal reflux disease)    Controlled with prn prilosec. Will follow.         Endocrine   Type 2 diabetes, uncontrolled, with neuropathy (HCC)    Pending a1c. Will follow.       Relevant Orders   Hemoglobin A1c     Other   Hyperlipidemia    On crestor and fibrate. Pending lipid panel. Likely will stop fibrate since tolerating crestor at this time.       Relevant Orders   Lipid panel   Left hip pain - Primary    Symptoms consistent with trochanteric bursitis. Calf pain nonspecific at this time; it has been  chronic. Reassured by benign exam and absence of pain today.  Pending x-rays. Patient would like to return a chiropractor. Also discussed physical therapy, politely declined at this time. If conservative therapy does not yield results, will consult ortho.      Relevant Orders   DG HIP UNILAT WITH PELVIS 2-3 VIEWS LEFT   DG Tibia/Fibula Left       I am having Ms. Aziz maintain her aspirin, fenofibrate micronized, meloxicam, glucose blood, lisinopril, DULoxetine, glimepiride, omeprazole, metFORMIN, and  atorvastatin.   No orders of the defined types were placed in this encounter.   Return precautions given.   Risks, benefits, and alternatives of the medications and treatment plan prescribed today were discussed, and patient expressed understanding.   Education regarding symptom management and diagnosis given to patient on AVS.  Continue to follow with Allegra Grana, FNP for routine health maintenance.   Brittany [name] and I agreed with plan.   [name], FNP

## 2017-07-03 NOTE — Assessment & Plan Note (Addendum)
Symptoms consistent with trochanteric bursitis. Calf pain nonspecific at this time; it has been chronic. Reassured by benign exam and absence of pain today.  Pending x-rays. Patient would like to return a chiropractor. Also discussed physical therapy, politely declined at this time. If conservative therapy does not yield results, will consult ortho.

## 2017-07-03 NOTE — Assessment & Plan Note (Signed)
Controlled with prn prilosec. Will follow.

## 2017-07-03 NOTE — Assessment & Plan Note (Signed)
Controlled on current regimen.  Will continue 

## 2017-07-03 NOTE — Assessment & Plan Note (Signed)
Pending a1c. Will follow.

## 2017-07-03 NOTE — Assessment & Plan Note (Signed)
On crestor and fibrate. Pending lipid panel. Likely will stop fibrate since tolerating crestor at this time.

## 2017-07-04 ENCOUNTER — Other Ambulatory Visit: Payer: Self-pay | Admitting: Family

## 2017-07-04 DIAGNOSIS — E785 Hyperlipidemia, unspecified: Secondary | ICD-10-CM

## 2017-07-04 LAB — COMPREHENSIVE METABOLIC PANEL
A/G RATIO: 2 (ref 1.2–2.2)
ALK PHOS: 79 IU/L (ref 39–117)
ALT: 20 IU/L (ref 0–32)
AST: 14 IU/L (ref 0–40)
Albumin: 4.5 g/dL (ref 3.6–4.8)
BILIRUBIN TOTAL: 0.8 mg/dL (ref 0.0–1.2)
BUN/Creatinine Ratio: 29 — ABNORMAL HIGH (ref 12–28)
BUN: 16 mg/dL (ref 8–27)
CHLORIDE: 101 mmol/L (ref 96–106)
CO2: 25 mmol/L (ref 20–29)
Calcium: 9.8 mg/dL (ref 8.7–10.3)
Creatinine, Ser: 0.56 mg/dL — ABNORMAL LOW (ref 0.57–1.00)
GFR calc Af Amer: 117 mL/min/{1.73_m2} (ref >59)
GFR calc non Af Amer: 102 mL/min/{1.73_m2} (ref >59)
GLOBULIN, TOTAL: 2.3 g/dL (ref 1.5–4.5)
Glucose: 148 mg/dL — ABNORMAL HIGH (ref 65–99)
POTASSIUM: 4.5 mmol/L (ref 3.5–5.2)
SODIUM: 143 mmol/L (ref 134–144)
Total Protein: 6.8 g/dL (ref 6.0–8.5)

## 2017-07-04 LAB — LIPID PANEL
CHOL/HDL RATIO: 3.2 ratio (ref 0.0–4.4)
CHOLESTEROL TOTAL: 159 mg/dL (ref 100–199)
HDL: 49 mg/dL (ref >39)
LDL CALC: 93 mg/dL (ref 0–99)
Triglycerides: 87 mg/dL (ref 0–149)
VLDL CHOLESTEROL CAL: 17 mg/dL (ref 5–40)

## 2017-07-04 LAB — HEMOGLOBIN A1C
ESTIMATED AVERAGE GLUCOSE: 214 mg/dL
Hgb A1c MFr Bld: 9.1 % — ABNORMAL HIGH (ref 4.8–5.6)

## 2017-07-04 MED ORDER — ATORVASTATIN CALCIUM 20 MG PO TABS: 20.0000 mg | ORAL_TABLET | Freq: Every day | ORAL | 1 refills | Status: DC

## 2017-07-05 ENCOUNTER — Other Ambulatory Visit: Payer: Self-pay | Admitting: Family

## 2017-07-08 ENCOUNTER — Other Ambulatory Visit: Payer: Self-pay | Admitting: Family

## 2017-07-08 DIAGNOSIS — I10 Essential (primary) hypertension: Secondary | ICD-10-CM

## 2017-07-12 ENCOUNTER — Telehealth: Payer: Self-pay | Admitting: Family

## 2017-07-12 DIAGNOSIS — IMO0002 Reserved for concepts with insufficient information to code with codable children: Secondary | ICD-10-CM

## 2017-07-12 DIAGNOSIS — E1140 Type 2 diabetes mellitus with diabetic neuropathy, unspecified: Secondary | ICD-10-CM

## 2017-07-12 DIAGNOSIS — E1165 Type 2 diabetes mellitus with hyperglycemia: Principal | ICD-10-CM

## 2017-07-12 MED ORDER — METFORMIN HCL ER 500 MG PO TB24: ORAL_TABLET | ORAL | 3 refills | Status: DC

## 2017-07-12 NOTE — Telephone Encounter (Signed)
Pt needs her metFORMIN (GLUCOPHAGE XR) 500 MG 24 hr tablet refilled she is almost out.

## 2017-07-12 NOTE — Telephone Encounter (Signed)
Medication has been refilled per request. 

## 2017-07-13 ENCOUNTER — Encounter: Payer: Self-pay | Admitting: Family

## 2017-07-27 ENCOUNTER — Other Ambulatory Visit: Payer: Self-pay | Admitting: Family

## 2017-07-27 DIAGNOSIS — IMO0002 Reserved for concepts with insufficient information to code with codable children: Secondary | ICD-10-CM

## 2017-07-27 DIAGNOSIS — E1140 Type 2 diabetes mellitus with diabetic neuropathy, unspecified: Secondary | ICD-10-CM

## 2017-07-27 DIAGNOSIS — E1165 Type 2 diabetes mellitus with hyperglycemia: Principal | ICD-10-CM

## 2017-08-18 ENCOUNTER — Other Ambulatory Visit: Payer: Self-pay | Admitting: Family

## 2017-08-19 ENCOUNTER — Other Ambulatory Visit: Payer: Self-pay | Admitting: Family

## 2017-08-19 DIAGNOSIS — E119 Type 2 diabetes mellitus without complications: Secondary | ICD-10-CM

## 2017-08-20 ENCOUNTER — Ambulatory Visit
Admission: RE | Admit: 2017-08-20 | Discharge: 2017-08-20 | Disposition: A | Discharge status: Home / Self Care | Payer: 59 | Source: Ambulatory Visit | Attending: Family | Admitting: Family

## 2017-08-20 DIAGNOSIS — Z1239 Encounter for other screening for malignant neoplasm of breast: Secondary | ICD-10-CM

## 2017-08-20 DIAGNOSIS — Z1231 Encounter for screening mammogram for malignant neoplasm of breast: Secondary | ICD-10-CM | POA: Insufficient documentation

## 2017-09-28 ENCOUNTER — Telehealth: Payer: Self-pay | Admitting: Family

## 2017-09-28 ENCOUNTER — Other Ambulatory Visit: Payer: Self-pay | Admitting: *Deleted

## 2017-09-28 DIAGNOSIS — E1165 Type 2 diabetes mellitus with hyperglycemia: Principal | ICD-10-CM

## 2017-09-28 DIAGNOSIS — E1140 Type 2 diabetes mellitus with diabetic neuropathy, unspecified: Secondary | ICD-10-CM

## 2017-09-28 DIAGNOSIS — IMO0002 Reserved for concepts with insufficient information to code with codable children: Secondary | ICD-10-CM

## 2017-09-28 MED ORDER — METFORMIN HCL ER 500 MG PO TB24: ORAL_TABLET | ORAL | 3 refills | Status: DC

## 2017-10-01 ENCOUNTER — Other Ambulatory Visit: Payer: Self-pay | Admitting: Family

## 2017-10-01 ENCOUNTER — Other Ambulatory Visit: Payer: Self-pay

## 2017-10-01 DIAGNOSIS — IMO0002 Reserved for concepts with insufficient information to code with codable children: Secondary | ICD-10-CM

## 2017-10-01 DIAGNOSIS — E1165 Type 2 diabetes mellitus with hyperglycemia: Principal | ICD-10-CM

## 2017-10-01 DIAGNOSIS — E1140 Type 2 diabetes mellitus with diabetic neuropathy, unspecified: Secondary | ICD-10-CM

## 2017-10-01 NOTE — Telephone Encounter (Addendum)
Copied from CRM [phone]. Topic: Quick Communication - Rx Refill/Question >> Oct 01, 2017  1:04 PM Crist InfanteHarrald, Kathy J wrote: Has the patient contacted their pharmacy? {yes  Pt states the pharmacy never received her  metFORMIN (GLUCOPHAGE XR) 500 MG 24 hr tablet  [facility] and they do not have any power. [facility] [address]  Pt states she has been out for a week and needs asap

## 2017-10-02 ENCOUNTER — Other Ambulatory Visit: Payer: Self-pay

## 2017-10-02 ENCOUNTER — Other Ambulatory Visit: Payer: Self-pay | Admitting: *Deleted

## 2017-10-02 DIAGNOSIS — IMO0002 Reserved for concepts with insufficient information to code with codable children: Secondary | ICD-10-CM

## 2017-10-02 DIAGNOSIS — E1140 Type 2 diabetes mellitus with diabetic neuropathy, unspecified: Secondary | ICD-10-CM

## 2017-10-02 DIAGNOSIS — E1165 Type 2 diabetes mellitus with hyperglycemia: Principal | ICD-10-CM

## 2017-10-02 NOTE — Telephone Encounter (Signed)
[facility]ed pt[facility]. Voice mail not set up.

## 2017-10-02 NOTE — Telephone Encounter (Addendum)
Pt very upset this has not been sent in yet pt has been out a week  [id number] [place] [place] - [address] [phone] (Phone) [phone] (Fax)   Pt would like to pick up this afternoon please  metFORMIN (GLUCOPHAGE XR) 500 MG 24 hr tablet [facility] in [facility] preferreded pharmac[facility] is [facility], it is listed [place] in [facility] directory, even though we have [place] as the city

## 2017-10-02 NOTE — Telephone Encounter (Signed)
This medication was ordered yesterday by provider.   metFORMIN (GLUCOPHAGE-XR) 500 MG 24 hr tablet [Pharmacy Med Name: METFORMIN ER 500 MG TABLET] [[id number]][[id number]]   Dose, Route, Frequency: As Directed   Dispense Quantity: 90 tablet Refills: 2 Fills remaining: --        Sig: START TAKING 1 TAB BY MOUTH EVERY EVENING. INCREASE BY 1 TAB EVERY WK AS TOLERATED. MAX 2000MG        Written Date: -- Expiration Date: -- Ordering Date: 10/01/17   Start Date: 10/01/17 End Date: --    Ordering Provider:  -- Authorizing Provider:  Allegra GranaArnett, [name] G Ordering User:  Interface, Surescripts Out        Diagnosis Association: Type 2 diabetes, uncontrolled, with neuropathy (HCC) (E11.40 , E11.65)    Appointment Staff   Staff Department  Allegra GranaArnett, [name] G, FNP [facility]      Original Order:  metFORMIN (GLUCOPHAGE XR) 500 MG 24 hr tablet [[id number]][[id number]]    [facility]  [id number] [place] [place] - [address]     Pharmacy Comments:  --       Fill quantity remaining:  -- Fill quantity used:  --       Spoke with Ryan at the pharmacy, and he says per patient she is already titrated up to 2000mg  per day.  Pharmacy states the patients insurance will only pay for a 90 day supply, so for 500mg  tablet, should be a quantity of 360.  Please review and resend to pharmacy if acceptable.

## 2017-10-03 ENCOUNTER — Other Ambulatory Visit: Payer: Self-pay

## 2017-10-03 ENCOUNTER — Telehealth: Payer: Self-pay | Admitting: Family

## 2017-10-03 DIAGNOSIS — IMO0002 Reserved for concepts with insufficient information to code with codable children: Secondary | ICD-10-CM

## 2017-10-03 DIAGNOSIS — E1165 Type 2 diabetes mellitus with hyperglycemia: Principal | ICD-10-CM

## 2017-10-03 DIAGNOSIS — E1140 Type 2 diabetes mellitus with diabetic neuropathy, unspecified: Secondary | ICD-10-CM

## 2017-10-03 MED ORDER — METFORMIN HCL ER 500 MG PO TB24: ORAL_TABLET | ORAL | 2 refills | Status: DC

## 2017-10-03 NOTE — Telephone Encounter (Signed)
Copied from CRM [phone]. Topic: Quick Communication - Rx Refill/Question >> Oct 03, 2017  2:08 PM Darletta MollLander, Lumin L wrote: Has the patient contacted their pharmacy? Yes.   (Agent: If no, request that the patient contact the pharmacy for the refill.) Preferred Pharmacy (with phone number or street name): [id number] [place] [address] Agent: Please be advised that RX refills may take up to 3 business days. We ask that you follow-up with your pharmacy. Refill metformin faxed 3 times. Need 30day supply taking 4 pills a day. Has been out for over a week. Wrong pharm was documented on req 12/10.

## 2017-10-03 NOTE — Telephone Encounter (Signed)
Medication has been sent in with correct amount as per pharmacy request

## 2017-10-03 NOTE — Telephone Encounter (Signed)
FYI

## 2017-10-05 ENCOUNTER — Other Ambulatory Visit: Payer: Self-pay | Admitting: Family

## 2017-10-05 DIAGNOSIS — M19041 Primary osteoarthritis, right hand: Secondary | ICD-10-CM

## 2017-10-24 ENCOUNTER — Other Ambulatory Visit: Payer: Self-pay | Admitting: Family

## 2017-10-24 DIAGNOSIS — K2100 Gastro-esophageal reflux disease with esophagitis, without bleeding: Secondary | ICD-10-CM

## 2017-10-31 ENCOUNTER — Other Ambulatory Visit: Payer: Self-pay | Admitting: Family

## 2017-11-22 ENCOUNTER — Telehealth: Payer: Self-pay | Admitting: Family

## 2017-11-22 MED ORDER — BLOOD GLUCOSE METER KIT: PACK | 0 refills | Status: DC

## 2017-11-22 NOTE — Telephone Encounter (Signed)
Ok to change

## 2017-11-22 NOTE — Telephone Encounter (Signed)
Yes  Glucometer meter changes are ok to do on your own , it's whatever their insurance will cover

## 2017-11-22 NOTE — Telephone Encounter (Signed)
rx has been printed, signed and faxed.  

## 2017-11-22 NOTE — Telephone Encounter (Signed)
Copied from CRM [phone]. Topic: Quick Communication - See Telephone Encounter >> Nov 22, 2017  9:51 AM Arlyss Gandyichardson, Mckinzie Saksa N, NT wrote: CRM for notification. See Telephone encounter for: Pt calling and states that she received a letter from her insurance company that she needs to change her glucose meter to an Accu Check meter instead of a One Touch. [facility] [place]   11/22/17.

## 2017-11-22 NOTE — Telephone Encounter (Signed)
Pt previously seen by Sena HitchMargaret Arnette; requesting to change glucose meter

## 2017-11-22 NOTE — Telephone Encounter (Signed)
Meter has been changed as per request.

## 2017-12-30 ENCOUNTER — Other Ambulatory Visit: Payer: Self-pay | Admitting: Family

## 2017-12-30 DIAGNOSIS — I10 Essential (primary) hypertension: Secondary | ICD-10-CM

## 2018-01-14 ENCOUNTER — Encounter: Payer: Self-pay | Admitting: Family

## 2018-01-14 ENCOUNTER — Ambulatory Visit (INDEPENDENT_AMBULATORY_CARE_PROVIDER_SITE_OTHER): Payer: 59 | Admitting: Family

## 2018-01-14 VITALS — BP 160/88 | HR 89 | Temp 98.1°F | Resp 16 | Ht 61.0 in | Wt 167.2 lb

## 2018-01-14 DIAGNOSIS — I10 Essential (primary) hypertension: Secondary | ICD-10-CM | POA: Diagnosis not present

## 2018-01-14 DIAGNOSIS — IMO0002 Reserved for concepts with insufficient information to code with codable children: Secondary | ICD-10-CM

## 2018-01-14 DIAGNOSIS — E1140 Type 2 diabetes mellitus with diabetic neuropathy, unspecified: Secondary | ICD-10-CM

## 2018-01-14 DIAGNOSIS — E785 Hyperlipidemia, unspecified: Secondary | ICD-10-CM | POA: Diagnosis not present

## 2018-01-14 DIAGNOSIS — E1165 Type 2 diabetes mellitus with hyperglycemia: Secondary | ICD-10-CM

## 2018-01-14 NOTE — Patient Instructions (Addendum)
Please start lisinopril again  Monitor blood pressure,  Goal is less than 130/80; if persistently higher, please make sooner follow up appointment so we can recheck you blood pressure and manage medications  Lipitor ONCE per week and try to increase to twice weekly in a few weeks  Labs   Pleasure seing you!

## 2018-01-14 NOTE — Assessment & Plan Note (Signed)
Has been off of the Lipitor.  Discussed with patient she may start Lipitor back in once per week and we can increase from there.  She was agreeable to this plan.

## 2018-01-14 NOTE — Assessment & Plan Note (Signed)
Pending A1c 

## 2018-01-14 NOTE — Progress Notes (Signed)
Subjective:    Patient ID: Brittany [name], female    DOB: August 01, 1956, 62 y.o.   MRN: [id number]  CC: Brittany [name] is a 62 y.o. female who presents today for follow up.   HPI: HTN- off the lisinopril as was unsure if this was causing her leg cramping.  Denies any chest pain, shortness of breath, palpitations.   HLD-has been off the Lipitor she was concerned this was causing her leg cramping in bilateral legs.  The cramps resolved when she stopped this medication.  DM-compliant with medications.  No wounds on her feet, numbness, tingling.     HISTORY:  Past Medical History:  Diagnosis Date  . Diabetes mellitus without complication ([place])   . Hyperlipidemia   . Hypertension    pt states on medication as prevenative  . Obesity    Past Surgical History:  Procedure Laterality Date  . ABDOMINAL HYSTERECTOMY    . COLONOSCOPY WITH PROPOFOL N/A 06/25/2015   Procedure: COLONOSCOPY WITH PROPOFOL;  Surgeon: Lucilla Lame, MD;  Location: [facility];  Service: Endoscopy;  Laterality: N/A;  Diabetic - oral meds  . ELBOW SURGERY Left   . HIP SURGERY Right    twice   Family History  Problem Relation Age of Onset  . Diabetes Mother   . Cancer Father        unknown; '90 % cancer'  . Diabetes Sister   . Diabetes Brother   . Breast cancer Cousin        unknown age at dx.    Allergies: Atorvastatin; Jardiance [empagliflozin]; Metformin & diet manage prod; Rosuvastatin; and Victoza [liraglutide] Current Outpatient Medications on File Prior to Visit  Medication Sig Dispense Refill  . aspirin 81 MG tablet Take 81 mg by mouth daily.    . blood glucose meter kit and supplies Dispense based on patient and insurance preference. Use up to four times daily as directed. (FOR ICD-10 E10.9, E11.9). 1 each 0  . DULoxetine (CYMBALTA) 60 MG capsule TAKE 1 CAPSULE (60 MG TOTAL) BY MOUTH DAILY. 90 capsule 1  . glimepiride (AMARYL) 4 MG tablet TAKE 1 TABLET (4 MG TOTAL) BY MOUTH DAILY WITH  BREAKFAST. 90 tablet 1  . glucose blood (ONE TOUCH ULTRA TEST) test strip USE TO CHECK BLOOD GLUCOSE TWICE A DAY 100 each 1  . meloxicam (MOBIC) 15 MG tablet TAKE 1 TABLET (15 MG TOTAL) BY MOUTH DAILY. 90 tablet 1  . metFORMIN (GLUCOPHAGE-XR) 500 MG 24 hr tablet START TAKING 1 TAB BY MOUTH EVERY EVENING. INCREASE BY 1 TAB EVERY WK AS TOLERATED. MAX '2000MG'$  360 tablet 2  . omeprazole (PRILOSEC) 40 MG capsule TAKE 1 CAPSULE (40 MG TOTAL) BY MOUTH DAILY AS NEEDED. 90 capsule 1  . atorvastatin (LIPITOR) 20 MG tablet Take 1 tablet (20 mg total) by mouth daily. (Patient not taking: Reported on 01/14/2018) 90 tablet 1  . lisinopril (PRINIVIL,ZESTRIL) 10 MG tablet Take 1 tablet (10 mg total) by mouth daily. appt needed for 90 days supply. Please call office for appt. (Patient not taking: Reported on 01/14/2018) 30 tablet 0   No current facility-administered medications on file prior to visit.     Social History   Tobacco Use  . Smoking status: Never Smoker  . Smokeless tobacco: Never Used  Substance Use Topics  . Alcohol use: Yes    Alcohol/week: 0.0 oz    Comment: 1 times a year-wine  . Drug use: No    Review of Systems  Constitutional: Negative  for chills and fever.  Respiratory: Negative for cough.   Cardiovascular: Negative for chest pain and palpitations.  Gastrointestinal: Negative for nausea and vomiting.      Objective:    BP (!) 160/88 (BP Location: Right Arm, Patient Position: Sitting, Cuff Size: Normal)   Pulse 89   Temp 98.1 F (36.7 C) (Oral)   Resp 16   Ht '5\' 1"'$  (1.549 m)   Wt 167 lb 4 oz (75.9 kg)   SpO2 98%   BMI 31.60 kg/m  BP Readings from Last 3 Encounters:  01/14/18 (!) 160/88  07/03/17 138/78  05/09/17 112/70   Wt Readings from Last 3 Encounters:  01/14/18 167 lb 4 oz (75.9 kg)  07/03/17 162 lb 9.6 oz (73.8 kg)  05/09/17 162 lb 1.9 oz (73.5 kg)    Physical Exam  Constitutional: She appears well-developed and well-nourished.  Eyes: Conjunctivae are  normal.  Cardiovascular: Normal rate, regular rhythm, normal heart sounds and normal pulses.  Pulmonary/Chest: Effort normal and breath sounds normal. She has no wheezes. She has no rhonchi. She has no rales.  Neurological: She is alert.  Skin: Skin is warm and dry.  Psychiatric: She has a normal mood and affect. Her speech is normal and behavior is normal. Thought content normal.  Vitals reviewed.      Assessment & Plan:   Problem List Items Addressed This Visit      Cardiovascular and Mediastinum   Essential hypertension    Elevated.  Patient will start back on lisinopril.  She will monitor her blood pressure and keep a log to ensure that it comes down below goal.      Relevant Orders   Basic metabolic panel     Endocrine   Type 2 diabetes, uncontrolled, with neuropathy ([place]) - Primary    Pending A1c.      Relevant Orders   Hemoglobin A1c     Other   Hyperlipidemia    Has been off of the Lipitor.  Discussed with patient she may start Lipitor back in once per week and we can increase from there.  She was agreeable to this plan.          I am having Linard Millers maintain her aspirin, atorvastatin, DULoxetine, glimepiride, metFORMIN, meloxicam, omeprazole, glucose blood, blood glucose meter kit and supplies, and lisinopril.   No orders of the defined types were placed in this encounter.   Return precautions given.   Risks, benefits, and alternatives of the medications and treatment plan prescribed today were discussed, and patient expressed understanding.   Education regarding symptom management and diagnosis given to patient on AVS.  Continue to follow with Burnard Hawthorne, FNP for routine health maintenance.   Linard Millers and I agreed with plan.   Mable Paris, FNP

## 2018-01-14 NOTE — Assessment & Plan Note (Signed)
Elevated.  Patient will start back on lisinopril.  She will monitor her blood pressure and keep a log to ensure that it comes down below goal.

## 2018-01-15 LAB — BASIC METABOLIC PANEL
BUN/Creatinine Ratio: 25 (ref 12–28)
BUN: 15 mg/dL (ref 8–27)
CALCIUM: 9.1 mg/dL (ref 8.7–10.3)
CO2: 22 mmol/L (ref 20–29)
CREATININE: 0.61 mg/dL (ref 0.57–1.00)
Chloride: 101 mmol/L (ref 96–106)
GFR calc Af Amer: 114 mL/min/{1.73_m2} (ref >59)
GFR, EST NON AFRICAN AMERICAN: 99 mL/min/{1.73_m2} (ref >59)
Glucose: 165 mg/dL — ABNORMAL HIGH (ref 65–99)
Potassium: 4.9 mmol/L (ref 3.5–5.2)
Sodium: 139 mmol/L (ref 134–144)

## 2018-01-15 LAB — HEMOGLOBIN A1C
Est. average glucose Bld gHb Est-mCnc: 235 mg/dL
Hgb A1c MFr Bld: 9.8 % — ABNORMAL HIGH (ref 4.8–5.6)

## 2018-01-21 ENCOUNTER — Telehealth: Payer: Self-pay | Admitting: Family

## 2018-01-21 NOTE — Telephone Encounter (Signed)
Call pt  Reviewing her chart, see that she has been on meloxicam 15 mg for some time.  In patients with diabetes, hypertension, we try to keep NSAIDs at lowest dose, shortness duration.  Is able to take QOD or just prn?  Would she consider a lower dose? ( like 7.5mg )  We can also discuss this at follow-up.  It may be that she needs a medication daily at the current dose -if that is the case, it is all the more important to keep her diabetes, blood pressure at goal.

## 2018-01-23 ENCOUNTER — Other Ambulatory Visit: Payer: Self-pay | Admitting: Internal Medicine

## 2018-01-23 DIAGNOSIS — E1140 Type 2 diabetes mellitus with diabetic neuropathy, unspecified: Secondary | ICD-10-CM

## 2018-01-23 DIAGNOSIS — E1165 Type 2 diabetes mellitus with hyperglycemia: Principal | ICD-10-CM

## 2018-01-23 DIAGNOSIS — IMO0002 Reserved for concepts with insufficient information to code with codable children: Secondary | ICD-10-CM

## 2018-01-25 ENCOUNTER — Other Ambulatory Visit: Payer: Self-pay

## 2018-01-25 ENCOUNTER — Other Ambulatory Visit: Payer: Self-pay | Admitting: Family

## 2018-01-25 DIAGNOSIS — I10 Essential (primary) hypertension: Secondary | ICD-10-CM

## 2018-02-01 NOTE — Telephone Encounter (Signed)
Spoke with patient and she is going to try taking every other day

## 2018-02-06 ENCOUNTER — Other Ambulatory Visit: Payer: Self-pay | Admitting: Family

## 2018-02-06 DIAGNOSIS — E785 Hyperlipidemia, unspecified: Secondary | ICD-10-CM

## 2018-02-17 ENCOUNTER — Other Ambulatory Visit: Payer: Self-pay | Admitting: Family

## 2018-02-17 DIAGNOSIS — E119 Type 2 diabetes mellitus without complications: Secondary | ICD-10-CM

## 2018-02-27 ENCOUNTER — Ambulatory Visit: Payer: 59 | Admitting: Family

## 2018-03-15 ENCOUNTER — Encounter: Payer: Self-pay | Admitting: Family

## 2018-03-15 ENCOUNTER — Ambulatory Visit (INDEPENDENT_AMBULATORY_CARE_PROVIDER_SITE_OTHER): Payer: 59 | Admitting: Family

## 2018-03-15 VITALS — BP 120/82 | HR 81 | Temp 98.5°F | Resp 16 | Wt 160.0 lb

## 2018-03-15 DIAGNOSIS — E11319 Type 2 diabetes mellitus with unspecified diabetic retinopathy without macular edema: Secondary | ICD-10-CM

## 2018-03-15 DIAGNOSIS — T466X5A Adverse effect of antihyperlipidemic and antiarteriosclerotic drugs, initial encounter: Secondary | ICD-10-CM | POA: Insufficient documentation

## 2018-03-15 DIAGNOSIS — E782 Mixed hyperlipidemia: Secondary | ICD-10-CM

## 2018-03-15 DIAGNOSIS — M791 Myalgia, unspecified site: Secondary | ICD-10-CM | POA: Diagnosis not present

## 2018-03-15 DIAGNOSIS — IMO0002 Reserved for concepts with insufficient information to code with codable children: Secondary | ICD-10-CM

## 2018-03-15 DIAGNOSIS — E1140 Type 2 diabetes mellitus with diabetic neuropathy, unspecified: Secondary | ICD-10-CM | POA: Diagnosis not present

## 2018-03-15 DIAGNOSIS — I10 Essential (primary) hypertension: Secondary | ICD-10-CM

## 2018-03-15 DIAGNOSIS — K219 Gastro-esophageal reflux disease without esophagitis: Secondary | ICD-10-CM | POA: Diagnosis not present

## 2018-03-15 DIAGNOSIS — E1165 Type 2 diabetes mellitus with hyperglycemia: Secondary | ICD-10-CM

## 2018-03-15 MED ORDER — LISINOPRIL 10 MG PO TABS: ORAL_TABLET | ORAL | 1 refills | Status: DC

## 2018-03-15 MED ORDER — METFORMIN HCL ER 500 MG PO TB24: ORAL_TABLET | ORAL | 2 refills | Status: DC

## 2018-03-15 NOTE — Assessment & Plan Note (Signed)
Controlled with avoiding triggers.  Takes as needed Prilosec which is appropriate.

## 2018-03-15 NOTE — Assessment & Plan Note (Signed)
Advised Lipitor once a week due to myalgias.

## 2018-03-15 NOTE — Patient Instructions (Addendum)
Fasting blood glucose in the morning is less than 120.   A1c goal is less than 6.5%.   Labs today  Would try lipitor once a week.   MORE water.. Stretching prior to bedtime.    Carbohydrate Counting for Diabetes Mellitus, Adult Carbohydrate counting is a method for keeping track of how many carbohydrates you eat. Eating carbohydrates naturally increases the amount of sugar (glucose) in the blood. Counting how many carbohydrates you eat helps keep your blood glucose within normal limits, which helps you manage your diabetes (diabetes mellitus). It is important to know how many carbohydrates you can safely have in each meal. This is different for every person. A diet and nutrition specialist (registered dietitian) can help you make a meal plan and calculate how many carbohydrates you should have at each meal and snack. Carbohydrates are found in the following foods:  Grains, such as breads and cereals.  Dried beans and soy products.  Starchy vegetables, such as potatoes, peas, and corn.  Fruit and fruit juices.  Milk and yogurt.  Sweets and snack foods, such as cake, cookies, candy, chips, and soft drinks.  How do I count carbohydrates? There are two ways to count carbohydrates in food. You can use either of the methods or a combination of both. Reading "Nutrition Facts" on packaged food The "Nutrition Facts" list is included on the labels of almost all packaged foods and beverages in the [place] It includes:  The serving size.  Information about nutrients in each serving, including the grams (g) of carbohydrate per serving.  To use the "Nutrition Facts":  Decide how many servings you will have.  Multiply the number of servings by the number of carbohydrates per serving.  The resulting number is the total amount of carbohydrates that you will be having.  Learning standard serving sizes of other foods When you eat foods containing carbohydrates that are not packaged or do not  include "Nutrition Facts" on the label, you need to measure the servings in order to count the amount of carbohydrates:  Measure the foods that you will eat with a food scale or measuring cup, if needed.  Decide how many standard-size servings you will eat.  Multiply the number of servings by 15. Most carbohydrate-rich foods have about 15 g of carbohydrates per serving. ? For example, if you eat 8 oz (170 g) of strawberries, you will have eaten 2 servings and 30 g of carbohydrates (2 servings x 15 g = 30 g).  For foods that have more than one food mixed, such as soups and casseroles, you must count the carbohydrates in each food that is included.  The following list contains standard serving sizes of common carbohydrate-rich foods. Each of these servings has about 15 g of carbohydrates:   hamburger bun or  English muffin.   oz (15 mL) syrup.   oz (14 g) jelly.  1 slice of bread.  1 six-inch tortilla.  3 oz (85 g) cooked rice or pasta.  4 oz (113 g) cooked dried beans.  4 oz (113 g) starchy vegetable, such as peas, corn, or potatoes.  4 oz (113 g) hot cereal.  4 oz (113 g) mashed potatoes or  of a large baked potato.  4 oz (113 g) canned or frozen fruit.  4 oz (120 mL) fruit juice.  4-6 crackers.  6 chicken nuggets.  6 oz (170 g) unsweetened dry cereal.  6 oz (170 g) plain fat-free yogurt or yogurt sweetened with artificial sweeteners.  8 oz (240 mL) milk.  8 oz (170 g) fresh fruit or one small piece of fruit.  24 oz (680 g) popped popcorn.  Example of carbohydrate counting Sample meal  3 oz (85 g) chicken breast.  6 oz (170 g) brown rice.  4 oz (113 g) corn.  8 oz (240 mL) milk.  8 oz (170 g) strawberries with sugar-free whipped topping. Carbohydrate calculation 1. Identify the foods that contain carbohydrates: ? Rice. ? Corn. ? Milk. ? Strawberries. 2. Calculate how many servings you have of each food: ? 2 servings rice. ? 1 serving  corn. ? 1 serving milk. ? 1 serving strawberries. 3. Multiply each number of servings by 15 g: ? 2 servings rice x 15 g = 30 g. ? 1 serving corn x 15 g = 15 g. ? 1 serving milk x 15 g = 15 g. ? 1 serving strawberries x 15 g = 15 g. 4. Add together all of the amounts to find the total grams of carbohydrates eaten: ? 30 g + 15 g + 15 g + 15 g = 75 g of carbohydrates total. This information is not intended to replace advice given to you by your health care provider. Make sure you discuss any questions you have with your health care provider. Document Released: 10/09/2005 Document Revised: 04/28/2016 Document Reviewed: 03/22/2016 Elsevier Interactive Patient Education  [employer].

## 2018-03-15 NOTE — Assessment & Plan Note (Signed)
At goal. Will continue current regimen.  

## 2018-03-15 NOTE — Assessment & Plan Note (Signed)
Suspect multifactorial including poor oral hydration, no stretching, statin therapy.  Electrodes have been normal at last visit.  discussed stretching, increasing water intake, taking Lipitor once a week.  Patient will let me know if no improvement.

## 2018-03-15 NOTE — Assessment & Plan Note (Signed)
Uncontrolled.  Pending A1c.  Had been on Jardiance in the past, will consider adding this medication back based on A1c.

## 2018-03-15 NOTE — Progress Notes (Signed)
Subjective:    Patient ID: Brittany [name], female    DOB: 07-31-1956, 62 y.o.   MRN: [id number]  CC: LUKISHA PROCIDA is a 62 y.o. female who presents today for follow up.   HPI: HLD- even when off lipitor, had LE cramping. Taking daily.   HTN- compliant with medication. Denies exertional chest pain or pressure, numbness or tingling radiating to left arm or jaw, palpitations, dizziness, frequent headaches, changes in vision, or shortness of breath.   DM- compliant with medication. FBG 175, 171, 194, 175.   Muscle cramps at bedtime, every night. Not drinking during the day.   GERD- takes prn if eating trigger foods, such a [place]. No trouble swallowing, hoarseness.   HISTORY:  Past Medical History:  Diagnosis Date  . Diabetes mellitus without complication ([place])   . Hyperlipidemia   . Hypertension    pt states on medication as prevenative  . Obesity    Past Surgical History:  Procedure Laterality Date  . ABDOMINAL HYSTERECTOMY    . COLONOSCOPY WITH PROPOFOL N/A 06/25/2015   Procedure: COLONOSCOPY WITH PROPOFOL;  Surgeon: Lucilla Lame, MD;  Location: [facility];  Service: Endoscopy;  Laterality: N/A;  Diabetic - oral meds  . ELBOW SURGERY Left   . HIP SURGERY Right    twice   Family History  Problem Relation Age of Onset  . Diabetes Mother   . Cancer Father        unknown; '90 % cancer'  . Diabetes Sister   . Diabetes Brother   . Breast cancer Cousin        unknown age at dx.    Allergies: Atorvastatin; Jardiance [empagliflozin]; Metformin & diet manage prod; Rosuvastatin; and Victoza [liraglutide] Current Outpatient Medications on File Prior to Visit  Medication Sig Dispense Refill  . aspirin 81 MG tablet Take 81 mg by mouth daily.    Marland Kitchen atorvastatin (LIPITOR) 20 MG tablet TAKE 1 TABLET BY MOUTH EVERY DAY 90 tablet 1  . blood glucose meter kit and supplies Dispense based on patient and insurance preference. Use up to four times daily as directed. (FOR ICD-10  E10.9, E11.9). 1 each 0  . DULoxetine (CYMBALTA) 60 MG capsule TAKE 1 CAPSULE BY MOUTH EVERY DAY 90 capsule 1  . glimepiride (AMARYL) 4 MG tablet TAKE 1 TABLET (4 MG TOTAL) BY MOUTH DAILY WITH BREAKFAST. 90 tablet 0  . glucose blood (ONE TOUCH ULTRA TEST) test strip USE TO CHECK BLOOD GLUCOSE TWICE A DAY 100 each 1  . meloxicam (MOBIC) 15 MG tablet TAKE 1 TABLET (15 MG TOTAL) BY MOUTH DAILY. 90 tablet 1  . omeprazole (PRILOSEC) 40 MG capsule TAKE 1 CAPSULE (40 MG TOTAL) BY MOUTH DAILY AS NEEDED. 90 capsule 1   No current facility-administered medications on file prior to visit.     Social History   Tobacco Use  . Smoking status: Never Smoker  . Smokeless tobacco: Never Used  Substance Use Topics  . Alcohol use: Yes    Alcohol/week: 0.0 oz    Comment: 1 times a year-wine  . Drug use: No    Review of Systems  Constitutional: Negative for chills and fever.  Respiratory: Negative for cough.   Cardiovascular: Negative for chest pain, palpitations and leg swelling.  Gastrointestinal: Negative for nausea and vomiting.  Musculoskeletal: Positive for myalgias.      Objective:    BP 120/82 (BP Location: Left Arm, Patient Position: Sitting, Cuff Size: Large)   Pulse 81  Temp 98.5 F (36.9 C) (Oral)   Resp 16   Wt 160 lb (72.6 kg)   SpO2 98%   BMI 30.23 kg/m  BP Readings from Last 3 Encounters:  03/15/18 120/82  01/14/18 (!) 160/88  07/03/17 138/78   Wt Readings from Last 3 Encounters:  03/15/18 160 lb (72.6 kg)  01/14/18 167 lb 4 oz (75.9 kg)  07/03/17 162 lb 9.6 oz (73.8 kg)    Physical Exam  Constitutional: She appears well-developed and well-nourished.  Eyes: Conjunctivae are normal.  Cardiovascular: Normal rate, regular rhythm, normal heart sounds and normal pulses.  Pulmonary/Chest: Effort normal and breath sounds normal. She has no wheezes. She has no rhonchi. She has no rales.  Neurological: She is alert.  Skin: Skin is warm and dry.  Psychiatric: She has a  normal mood and affect. Her speech is normal and behavior is normal. Thought content normal.  Vitals reviewed.      Assessment & Plan:   Problem List Items Addressed This Visit      Cardiovascular and Mediastinum   Essential hypertension - Primary    At goal.  Will continue current regimen      Relevant Medications   lisinopril (PRINIVIL,ZESTRIL) 10 MG tablet     Digestive   GERD (gastroesophageal reflux disease)    Controlled with avoiding triggers.  Takes as needed Prilosec which is appropriate.        Endocrine   Diabetic retinopathy associated with type 2 diabetes mellitus ([place])    Uncontrolled.  Pending A1c.  Had been on Jardiance in the past, will consider adding this medication back based on A1c.      Relevant Medications   lisinopril (PRINIVIL,ZESTRIL) 10 MG tablet   metFORMIN (GLUCOPHAGE-XR) 500 MG 24 hr tablet   Type 2 diabetes, uncontrolled, with neuropathy ([facility])   Relevant Medications   lisinopril (PRINIVIL,ZESTRIL) 10 MG tablet   metFORMIN (GLUCOPHAGE-XR) 500 MG 24 hr tablet   Other Relevant Orders   Hemoglobin A1c     Other   Hyperlipidemia    Advised Lipitor once a week due to myalgias.      Relevant Medications   lisinopril (PRINIVIL,ZESTRIL) 10 MG tablet   Other Relevant Orders   Lipid panel   Myalgia    Suspect multifactorial including poor oral hydration, no stretching, statin therapy.  Electrodes have been normal at last visit.  discussed stretching, increasing water intake, taking Lipitor once a week.  Patient will let me know if no improvement.          I am having Brittany [name] maintain her aspirin, meloxicam, omeprazole, glucose blood, blood glucose meter kit and supplies, DULoxetine, atorvastatin, glimepiride, lisinopril, and metFORMIN.   Meds ordered this encounter  Medications  . lisinopril (PRINIVIL,ZESTRIL) 10 MG tablet    Sig: TAKE 1 TABLET BY MOUTH DAILY. APPT NEEDED FOR 90 DAYS SUPPLY. PLEASE CALL OFFICE FOR APPT.     Dispense:  90 tablet    Refill:  1    Order Specific Question:   Supervising Provider    Answer:   Deborra Medina L [[id number]]  . metFORMIN (GLUCOPHAGE-XR) 500 MG 24 hr tablet    Sig: START TAKING 1 TAB BY MOUTH EVERY EVENING. INCREASE BY 1 TAB EVERY WK AS TOLERATED. MAX '2000MG'$     Dispense:  360 tablet    Refill:  2    Order Specific Question:   Supervising Provider    Answer:   Crecencio Mc [[id number]]    Return  precautions given.   Risks, benefits, and alternatives of the medications and treatment plan prescribed today were discussed, and patient expressed understanding.   Education regarding symptom management and diagnosis given to patient on AVS.  Continue to follow with Burnard Hawthorne, FNP for routine health maintenance.   Brittany [name] and I agreed with plan.   Mable Paris, FNP

## 2018-03-16 LAB — LIPID PANEL
CHOLESTEROL TOTAL: 240 mg/dL — AB (ref 100–199)
Chol/HDL Ratio: 4.5 ratio — ABNORMAL HIGH (ref 0.0–4.4)
HDL: 53 mg/dL (ref >39)
LDL CALC: 164 mg/dL — AB (ref 0–99)
TRIGLYCERIDES: 113 mg/dL (ref 0–149)
VLDL Cholesterol Cal: 23 mg/dL (ref 5–40)

## 2018-03-16 LAB — HEMOGLOBIN A1C
ESTIMATED AVERAGE GLUCOSE: 237 mg/dL
HEMOGLOBIN A1C: 9.9 % — AB (ref 4.8–5.6)

## 2018-03-26 ENCOUNTER — Other Ambulatory Visit: Payer: Self-pay | Admitting: Family

## 2018-03-27 ENCOUNTER — Other Ambulatory Visit: Payer: Self-pay | Admitting: Family

## 2018-03-27 DIAGNOSIS — E1165 Type 2 diabetes mellitus with hyperglycemia: Principal | ICD-10-CM

## 2018-03-27 DIAGNOSIS — IMO0002 Reserved for concepts with insufficient information to code with codable children: Secondary | ICD-10-CM

## 2018-03-27 DIAGNOSIS — E1140 Type 2 diabetes mellitus with diabetic neuropathy, unspecified: Secondary | ICD-10-CM

## 2018-03-27 MED ORDER — METFORMIN HCL ER (MOD) 1000 MG PO TB24: ORAL_TABLET | ORAL | 2 refills | Status: DC

## 2018-04-01 ENCOUNTER — Telehealth: Payer: Self-pay | Admitting: Family

## 2018-04-01 DIAGNOSIS — E1140 Type 2 diabetes mellitus with diabetic neuropathy, unspecified: Secondary | ICD-10-CM

## 2018-04-01 DIAGNOSIS — IMO0002 Reserved for concepts with insufficient information to code with codable children: Secondary | ICD-10-CM

## 2018-04-01 DIAGNOSIS — E1165 Type 2 diabetes mellitus with hyperglycemia: Principal | ICD-10-CM

## 2018-04-01 NOTE — Telephone Encounter (Unsigned)
Copied from CRM [id number]. Topic: Quick Communication - See Telephone Encounter >> Apr 01, 2018  1:01 PM Waymon AmatoBurton, Donna F wrote: Pt states that she was supposed to get something other than metformin er 500 -the provider did not tell her the name but when she went to pick up the new Medication it was the same thing as before metformin er 500   Best number [phone]

## 2018-04-01 NOTE — Telephone Encounter (Signed)
Please advise 

## 2018-04-01 NOTE — Telephone Encounter (Signed)
Medication: metFORMIN (GLUMETZA) 1000 MG (MOD) 24 hr tablet  Patient needs prior authorization on this script.

## 2018-04-01 NOTE — Telephone Encounter (Signed)
Please see note below. 

## 2018-04-02 NOTE — Telephone Encounter (Signed)
Please advise 

## 2018-04-02 NOTE — Telephone Encounter (Signed)
Prior authorization for metformin XR denied from insurance

## 2018-04-03 MED ORDER — METFORMIN HCL ER 500 MG PO TB24: 1000.0000 mg | ORAL_TABLET | Freq: Two times a day (BID) | ORAL | 2 refills | Status: DC

## 2018-04-03 NOTE — Telephone Encounter (Signed)
Call pt New rx for Glucophage XR 1000mg  BID sent- this should be covered per [facility]

## 2018-04-05 NOTE — Telephone Encounter (Signed)
Patient advised of below and verbalized understanding.  

## 2018-04-06 ENCOUNTER — Other Ambulatory Visit: Payer: Self-pay | Admitting: Family

## 2018-04-06 DIAGNOSIS — M19041 Primary osteoarthritis, right hand: Secondary | ICD-10-CM

## 2018-04-08 NOTE — Telephone Encounter (Signed)
Pt called about the Glucophage XR, pt states the pharmacy still does not have the medication, pt states she was told to call back if the medication has not been received; call pt to advise

## 2018-04-10 ENCOUNTER — Other Ambulatory Visit: Payer: Self-pay

## 2018-04-10 DIAGNOSIS — IMO0002 Reserved for concepts with insufficient information to code with codable children: Secondary | ICD-10-CM

## 2018-04-10 DIAGNOSIS — E1165 Type 2 diabetes mellitus with hyperglycemia: Principal | ICD-10-CM

## 2018-04-10 DIAGNOSIS — E1140 Type 2 diabetes mellitus with diabetic neuropathy, unspecified: Secondary | ICD-10-CM

## 2018-04-10 MED ORDER — METFORMIN HCL ER 500 MG PO TB24: 1000.0000 mg | ORAL_TABLET | Freq: Two times a day (BID) | ORAL | 2 refills | Status: DC

## 2018-04-11 NOTE — Telephone Encounter (Signed)
Patient called to request to speak with the doctor or her nurse regarding her medication metFORMIN (GLUCOPHAGE XR) 500 MG 24 hr tablet.  She thinks that there is some confusion on what she needs refilled.  CB# [phone].

## 2018-04-11 NOTE — Telephone Encounter (Signed)
Please advise 

## 2018-04-11 NOTE — Telephone Encounter (Signed)
Patient is already taking metformin XR   1000 mg bid .    She is willing to take once a week insulin.

## 2018-04-12 NOTE — Telephone Encounter (Signed)
Tried to call patient unable to leave voicemail will try again.

## 2018-04-12 NOTE — Telephone Encounter (Signed)
Called and notified patient that [facility] does not prescribe the once a week insulin but does recommend the daily basaglar or endo patient states that she does not want to stick her self and will think about seeing Endo and will let Margarett know at the next office visit.

## 2018-04-12 NOTE — Telephone Encounter (Signed)
Call pt I do not routinely prescribe once a week insulin - since newer medication  We can daily basal insulin lantus OR she can be referred to endocrine for once weekly insulin management.   Let me know her preference

## 2018-04-25 ENCOUNTER — Other Ambulatory Visit: Payer: Self-pay | Admitting: Family

## 2018-04-25 DIAGNOSIS — K2100 Gastro-esophageal reflux disease with esophagitis, without bleeding: Secondary | ICD-10-CM

## 2018-05-15 ENCOUNTER — Other Ambulatory Visit: Payer: Self-pay | Admitting: Family

## 2018-05-15 DIAGNOSIS — E119 Type 2 diabetes mellitus without complications: Secondary | ICD-10-CM

## 2018-06-11 ENCOUNTER — Ambulatory Visit: Payer: Self-pay | Admitting: *Deleted

## 2018-06-11 NOTE — Telephone Encounter (Signed)
Contacted pt regarding symptoms; she states that for the last week it sounds like "wave sounds" are in her ears; the pt says that she notices an increase in wax in both ears; she also says that for the past month she has been "tripping and falling" before the sound started in her ears; she says that she has been taking her diabetic medicaitoins and her blood sugar has been in the 200 range; she is most concerned about the falling; the pt states she fell last week and hurt her back and hips; additionally the pt feels dizzy at times; recommendations made per nurse triage protocol to include seeing a physician within 24 hours; the pt normally sees Rennie PlowmanMargaret Arnett but she has no availability within the guidelines per protocol; the pt states that she needs an afternoon appointment;she is offered and accepts appointment with Dr Darrick Huntsmanullo, [facility], 06/12/18 at 1600; she verbalizes understanding; will route to office for notification of this upcoming appointment.     Reason for Disposition . [1] MODERATE dizziness (e.g., interferes with normal activities) AND [2] has NOT been evaluated by physician for this  (Exception: dizziness caused by heat exposure, sudden standing, or poor fluid intake)  Answer Assessment - Initial Assessment Questions 1. DESCRIPTION: "Describe your dizziness."     "room moving but not spining" 2. LIGHTHEADED: "Do you feel lightheaded?" (e.g., somewhat faint, woozy, weak upon standing)     no 3. VERTIGO: "Do you feel like either you or the room is spinning or tilting?" (i.e. vertigo)     no 4. SEVERITY: "How bad is it?"  "Do you feel like you are going to faint?" "Can you stand and walk?"   - MILD - walking normally   - MODERATE - interferes with normal activities (e.g., work, school)    - SEVERE - unable to stand, requires support to walk, feels like passing out now.      moderate 5. ONSET:  "When did the dizziness begin?"     July 2019 6. AGGRAVATING FACTORS: "Does anything make  it worse?" (e.g., standing, change in head position)     Moving head 7. HEART RATE: "Can you tell me your heart rate?" "How many beats in 15 seconds?"  (Note: not all patients can do this)      Pt unable to complete this task 8. CAUSE: "What do you think is causing the dizziness?"  unsue 9. RECURRENT SYMPTOM: "Have you had dizziness before?" If so, ask: "When was the last time?" "What happened that time?"     no 10. OTHER SYMPTOMS: "Do you have any other symptoms?" (e.g., fever, chest pain, vomiting, diarrhea, bleeding)       Falling down, wave sounds in ear 11. PREGNANCY: "Is there any chance you are pregnant?" "When was your last menstrual period?"      No hysterectomy  Protocols used: DIZZINESS [facility]- LIGHTHEADEDNESS-A-AH

## 2018-06-11 NOTE — Telephone Encounter (Signed)
Patient was triaged by triage nurse and scheduled to see Dr. Darrick Huntsmanullo on 06-12-18.

## 2018-06-12 ENCOUNTER — Ambulatory Visit (INDEPENDENT_AMBULATORY_CARE_PROVIDER_SITE_OTHER): Payer: 59 | Admitting: Internal Medicine

## 2018-06-12 ENCOUNTER — Encounter: Payer: Self-pay | Admitting: Internal Medicine

## 2018-06-12 VITALS — BP 150/94 | HR 103 | Temp 98.1°F | Resp 16 | Wt 159.2 lb

## 2018-06-12 DIAGNOSIS — H8149 Vertigo of central origin, unspecified ear: Secondary | ICD-10-CM

## 2018-06-12 DIAGNOSIS — E1140 Type 2 diabetes mellitus with diabetic neuropathy, unspecified: Secondary | ICD-10-CM | POA: Diagnosis not present

## 2018-06-12 DIAGNOSIS — R296 Repeated falls: Secondary | ICD-10-CM

## 2018-06-12 DIAGNOSIS — IMO0002 Reserved for concepts with insufficient information to code with codable children: Secondary | ICD-10-CM

## 2018-06-12 DIAGNOSIS — H814 Vertigo of central origin: Secondary | ICD-10-CM

## 2018-06-12 DIAGNOSIS — R2689 Other abnormalities of gait and mobility: Secondary | ICD-10-CM

## 2018-06-12 DIAGNOSIS — E1165 Type 2 diabetes mellitus with hyperglycemia: Secondary | ICD-10-CM

## 2018-06-12 NOTE — Patient Instructions (Signed)
You need to redduce the sugar in your diet with alternative easy food choices, STARTING WITH BREAKFAST  Brittany MeckelJimmy [name] now makes a frozen breakfast frittata that can be microwaved in 2 minutes and is very low carb. Frittaats are similar to quiches without the crust   Brittany [name] makes a microwaveable breakfast called "Just crack an egg"     You might want to try a premixed [name] drink called Brittany [name] shake for breakfast or late night snack . It is great tasting,   very low sugar and available of < $2 serving at [facility] and  In bulk for $1.50/serving at [employer] and [employer]  .    Nutritional analysis :  160 cal  30 g [name]  1 g sugar 50% calcium needs       I have made referrals to our clinical pharmaciist to get you started on insulin  [facility] your frequent falls and neuropathy  .

## 2018-06-12 NOTE — Progress Notes (Signed)
Subjective:  Patient ID: Brittany [name], female    DOB: Jan 27, 1956  Age: 62 y.o. MRN: [id number]  CC:   HPI Brittany [name] presents for evaluation of abnormal sensation in ears,  Loss of balance, with recent fall (her 9th fall this month) .  Has a sensation of pulsating feeling in ears.  Occurs when walkin,  accompanied some of the time by dizziness .  Does not feel like the dizziness is causing her to fall.  States that some of her falls have been so sudden that she is not  aware she is falling until find herself on the floor.   Legs at times feel weak, especially when going down stairs.  Most recent fall occurred yesterday,  She lost he rbalance while working,  [employer],  Hit her lower back on some cans of food.   Denies headaches,  No seizure history   PMH includes poorly controlled T2DM with neuropathy, managed currently with amaryl 4 mg qd and metformin XR 1000 mg qd with prior intolerances to Victoza and Jardiance  And advice in May to start insulin .  NEVER STARTED IT because she wants to use the once weekly insulin   Stopped lisinopril recently because it "made her legs hurt" .  However she stands all day at work   At [employer] as a Therapist, sports. Heels have been numb for "a long time"  Has had diabetes for a long time too , unsure of diagnosis year. A1c has been > 8 since 2017    Diet is not low glycemic   "I don't have time  To eat like a diabetic"  Missed her last eye appt due to work conflicts.   Lab Results  Component Value Date   HGBA1C 9.9 (H) 03/15/2018     Outpatient Medications Prior to Visit  Medication Sig Dispense Refill  . aspirin 81 MG tablet Take 81 mg by mouth daily.    . blood glucose meter kit and supplies Dispense based on patient and insurance preference. Use up to four times daily as directed. (FOR ICD-10 E10.9, E11.9). 1 each 0  . DULoxetine (CYMBALTA) 60 MG capsule TAKE 1 CAPSULE BY MOUTH EVERY DAY 90 capsule 1  . glimepiride (AMARYL) 4 MG tablet TAKE 1  TABLET (4 MG TOTAL) BY MOUTH DAILY WITH BREAKFAST. 90 tablet 0  . glucose blood (ONE TOUCH ULTRA TEST) test strip USE TO CHECK BLOOD GLUCOSE TWICE A DAY 100 each 1  . meloxicam (MOBIC) 15 MG tablet TAKE 1 TABLET (15 MG TOTAL) BY MOUTH DAILY. 90 tablet 0  . metFORMIN (GLUCOPHAGE XR) 500 MG 24 hr tablet Take 2 tablets (1,000 mg total) by mouth 2 (two) times daily. 120 tablet 2  . omeprazole (PRILOSEC) 40 MG capsule TAKE 1 CAPSULE (40 MG TOTAL) BY MOUTH DAILY AS NEEDED. 90 capsule 1  . atorvastatin (LIPITOR) 20 MG tablet TAKE 1 TABLET BY MOUTH EVERY DAY (Patient not taking: Reported on 06/12/2018) 90 tablet 1  . lisinopril (PRINIVIL,ZESTRIL) 10 MG tablet TAKE 1 TABLET BY MOUTH DAILY. APPT NEEDED FOR 90 DAYS SUPPLY. PLEASE CALL OFFICE FOR APPT. (Patient not taking: Reported on 06/12/2018) 90 tablet 1   No facility-administered medications prior to visit.     Review of Systems;  Patient denies headache, fevers, malaise, unintentional weight loss, skin rash, eye pain, sinus congestion and sinus pain, sore throat, dysphagia,  hemoptysis , cough, dyspnea, wheezing, chest pain, palpitations, orthopnea, edema, abdominal pain, nausea, melena, diarrhea, constipation, flank  pain, dysuria, hematuria, urinary  Frequency, nocturia, , tingling, seizures,  Focal weakness, Loss of consciousness,  Tremor, insomnia, depression, anxiety, and suicidal ideation.      Objective:  BP (!) 150/94 (BP Location: Right Arm, Patient Position: Standing)   Pulse (!) 103   Temp 98.1 F (36.7 C) (Oral)   Resp 16   Wt 159 lb 4 oz (72.2 kg)   SpO2 97%   BMI 30.09 kg/m   BP Readings from Last 3 Encounters:  06/12/18 (!) 150/94  03/15/18 120/82  01/14/18 (!) 160/88    Wt Readings from Last 3 Encounters:  06/12/18 159 lb 4 oz (72.2 kg)  03/15/18 160 lb (72.6 kg)  01/14/18 167 lb 4 oz (75.9 kg)    General appearance: alert, cooperative and appears stated age Ears: normal TM's and external ear canals both ears Throat:  lips, mucosa, and tongue normal; teeth and gums normal Neck: no adenopathy, no carotid bruit, supple, symmetrical, trachea midline and thyroid not enlarged, symmetric, no tenderness/mass/nodules Back: symmetric, no curvature. ROM normal. No CVA tenderness. Lungs: clear to auscultation bilaterally Heart: regular rate and rhythm, S1, S2 normal, no murmur, click, rub or gallop Abdomen: soft, non-tender; bowel sounds normal; no masses,  no organomegaly Pulses: 2+ and symmetric Skin: Skin color, texture, turgor normal. No rashes or lesions Lymph nodes: Cervical, supraclavicular, and axillary nodes normal. Neuro:  awake and interactive with normal mood and affect. Higher cortical functions are normal. Speech is clear without word-finding difficulty or dysarthria. Extraocular movements are intact. Visual fields of both eyes are grossly intact. Sensation to light touch is grossly intact bilaterally of upper and lower extremities. Motor examination shows 4+/5 symmetric hand grip and upper extremity and 5/5 lower extremity strength. There is no pronation or drift. Gait is non-ataxic    Lab Results  Component Value Date   HGBA1C 9.9 (H) 03/15/2018   HGBA1C 9.8 (H) 01/14/2018   HGBA1C 9.1 (H) 07/03/2017    Lab Results  Component Value Date   CREATININE 0.61 01/14/2018   CREATININE 0.56 (L) 07/03/2017   CREATININE 0.72 03/29/2017    Lab Results  Component Value Date   WBC 6.1 07/24/2016   HGB 14.8 07/24/2016   HCT 43.5 07/24/2016   PLT 258.0 07/24/2016   GLUCOSE 165 (H) 01/14/2018   CHOL 240 (H) 03/15/2018   TRIG 113 03/15/2018   HDL 53 03/15/2018   LDLCALC 164 (H) 03/15/2018   ALT 20 07/03/2017   AST 14 07/03/2017   NA 139 01/14/2018   K 4.9 01/14/2018   CL 101 01/14/2018   CREATININE 0.61 01/14/2018   BUN 15 01/14/2018   CO2 22 01/14/2018   TSH 0.32 (L) 07/24/2016   HGBA1C 9.9 (H) 03/15/2018   MICROALBUR 0.8 07/24/2016    Mm Digital Screening Bilateral  Result Date:  08/20/2017 CLINICAL DATA:  Screening. EXAM: DIGITAL SCREENING BILATERAL MAMMOGRAM WITH CAD COMPARISON:  Previous exam(s). ACR Breast Density Category b: There are scattered areas of fibroglandular density. FINDINGS: There are no findings suspicious for malignancy. Images were processed with CAD. IMPRESSION: No mammographic evidence of malignancy. A result letter of this screening mammogram will be mailed directly to the patient. RECOMMENDATION: Screening mammogram in one year. (Code:[id number]-B-01Y) BI-RADS CATEGORY  1: Negative. Electronically Signed   By: Margarette Canada M.D.   On: 08/20/2017 12:03    Assessment & Plan:   Problem List Items Addressed This Visit    Type 2 diabetes, uncontrolled, with neuropathy ([place])    referring to  clinical pharmacist for initiation of weekly insulin       Relevant Orders   Hemoglobin A1c   Comprehensive metabolic panel   Lipid panel   Amb Referral to Clinical Pharmacist   Loss of balance    She secondary to dizziness and leg weakness.  She has a normal neurologic exam but has had 9 falls in the last month and has diabetic neuropathy diagnosis with no prior neurologic workup including EMG/[place] studies.  Some of her falls may be due to [place] . Strongly recommend neurologic evaluation.  MRI brain ordered and neurology Referral in progress        Other Visit Diagnoses    Recurrent falls while walking    -  Primary   Relevant Orders   Ambulatory referral to Neurology   Vertigo of central origin, unspecified laterality       Relevant Orders   MR Brain Wo Contrast     A total of 25 minutes of face to face time was spent with patient more than half of which was spent in counselling about the above mentioned conditions  and coordination of care    I have discontinued Zabrina Brotherton. Mergen's atorvastatin and lisinopril. I am also having her maintain her aspirin, glucose blood, blood glucose meter kit and supplies, DULoxetine, meloxicam, metFORMIN, omeprazole, and  glimepiride.  No orders of the defined types were placed in this encounter.   Medications Discontinued During This Encounter  Medication Reason  . atorvastatin (LIPITOR) 20 MG tablet Patient Preference  . lisinopril (PRINIVIL,ZESTRIL) 10 MG tablet Patient Preference    Follow-up: Return in about 5 days (around 06/17/2018).   Crecencio Mc, MD

## 2018-06-13 DIAGNOSIS — R2689 Other abnormalities of gait and mobility: Secondary | ICD-10-CM | POA: Insufficient documentation

## 2018-06-13 NOTE — Assessment & Plan Note (Addendum)
She secondary to dizziness and leg weakness.  She has a normal neurologic exam but has had 9 falls in the last month and has diabetic neuropathy diagnosis with no prior neurologic workup including EMG/[place] studies.  Some of her falls may be due to lOC . Strongly recommend neurologic evaluation.  MRI brain ordered and neurology Referral in progress

## 2018-06-13 NOTE — Assessment & Plan Note (Signed)
referring to clinical pharmacist for initiation of weekly insulin

## 2018-06-17 ENCOUNTER — Other Ambulatory Visit: Payer: 59

## 2018-06-21 ENCOUNTER — Ambulatory Visit: Payer: 59 | Admitting: Family

## 2018-06-25 ENCOUNTER — Ambulatory Visit: Payer: 59

## 2018-07-01 ENCOUNTER — Ambulatory Visit: Payer: 59 | Admitting: Pharmacist

## 2018-07-03 ENCOUNTER — Other Ambulatory Visit: Payer: Self-pay | Admitting: Family

## 2018-07-03 DIAGNOSIS — M19041 Primary osteoarthritis, right hand: Secondary | ICD-10-CM

## 2018-07-04 NOTE — Telephone Encounter (Signed)
Last filled 03/29/18 Last office visit 06/12/18 Next office visit scheduled

## 2018-07-05 NOTE — Telephone Encounter (Signed)
Call pt I declined refil of mobic since BP has been running high  This can make worse  Please make f/u appt so we can check BP as soon as we can. It was 150/94 when she saw Darrick Huntsmanullo

## 2018-07-11 NOTE — Telephone Encounter (Signed)
Called patient schedule follow up appointment for blood pressure she states she will call back next week to schedule. She has stopped metformin due to it was causing leg cramps.

## 2018-07-12 NOTE — Telephone Encounter (Signed)
Call pt Please make appointment to see our pharmacist.  I am very concerned that her blood pressure, diabetes is not well controlled at this time.  I Understand she is unable to tolerate metformin however her A1c is quite high.  Please ensure she makes appointment with myself or the pharmacist ASAP   She may also see endocrine if she would like- let meknow

## 2018-07-15 LAB — HM DIABETES EYE EXAM

## 2018-07-18 NOTE — Telephone Encounter (Addendum)
Tried calling patient no voicemail set up, Left voice mail for patient to call back ok for P[facility] to speak to patient

## 2018-07-26 NOTE — Telephone Encounter (Signed)
Patient cancelled MRI due to she couldn't afford due to financial reasons,  She states she is doing well.  She missed appointment with [facility] pharmacist due to she got appointment time mixed up and we re-scheduled appointment.   Advised her that we could  Refill the Mobic due to her elevated blood pressure.

## 2018-07-26 NOTE — Telephone Encounter (Signed)
Call pt  I wanted to circle back since she saw my colleauge, Dr Darrick Huntsman  Does she have an appt with neurology? When is MRI brain scheduled?  Has she made an appt with pharmacist for DM?  Please advise her that I did not refill her mobic as this can elevated BP and cause kidney damage. We need to focus on DM management at this time.

## 2018-08-05 ENCOUNTER — Encounter: Payer: Self-pay | Admitting: Pharmacist

## 2018-08-05 ENCOUNTER — Ambulatory Visit (INDEPENDENT_AMBULATORY_CARE_PROVIDER_SITE_OTHER): Payer: 59 | Admitting: Pharmacist

## 2018-08-05 DIAGNOSIS — E785 Hyperlipidemia, unspecified: Secondary | ICD-10-CM

## 2018-08-05 DIAGNOSIS — E1165 Type 2 diabetes mellitus with hyperglycemia: Secondary | ICD-10-CM | POA: Diagnosis not present

## 2018-08-05 DIAGNOSIS — E1140 Type 2 diabetes mellitus with diabetic neuropathy, unspecified: Secondary | ICD-10-CM | POA: Diagnosis not present

## 2018-08-05 DIAGNOSIS — IMO0002 Reserved for concepts with insufficient information to code with codable children: Secondary | ICD-10-CM

## 2018-08-05 DIAGNOSIS — E119 Type 2 diabetes mellitus without complications: Secondary | ICD-10-CM

## 2018-08-05 MED ORDER — DULAGLUTIDE 0.75 MG/0.5ML ~~LOC~~ SOAJ: 0.7500 mg | SUBCUTANEOUS | 2 refills | Status: DC

## 2018-08-05 MED ORDER — GLIMEPIRIDE 4 MG PO TABS: 2.0000 mg | ORAL_TABLET | Freq: Every day | ORAL | 0 refills | Status: DC

## 2018-08-05 MED ORDER — ROSUVASTATIN CALCIUM 10 MG PO TABS: 10.0000 mg | ORAL_TABLET | Freq: Every day | ORAL | 3 refills | Status: DC

## 2018-08-05 NOTE — Patient Instructions (Addendum)
It was great to meet you today!  We are going to start Trulicity (dulaglutide) 0.75 mg once weekly. It does not matter what time of day you take this medication. You may experience some stomach upset when first starting this medication.   I also want to decrease glimepiride to 2 mg with breakfast to prevent low blood sugars.   Keep checking your blood sugars:  1) First thing in the morning, before you've had anything to eat (fasting): Goal: 80-130 2) A few days a week, check about 2 hours after your largest meal: Goal less than 180  We are also going to start rosuvastatin 10 mg daily. You can take this medication at any time of the day.    Give [place] a call if you have any questions or concerns. Schedule follow up with me in ~4 weeks.    Catie Feliz Beam, PharmD

## 2018-08-05 NOTE — Progress Notes (Addendum)
S:     Chief Complaint  Patient presents with  . Medication Management    Diabetes    Patient arrives in good spirits, ambulating without assistance.  Presents for diabetes evaluation, education, and management at the request of Dr. Derrel Nip at last appointment on 06/12/2018. At that time, it was noted that patient had stopped atorvastatin and lisinopril due to concerns that they might be contributing to multiple recent falls that she experienced. She notes that she has not had any issues with falls since then.   She does note that she stopped all of her medications in September for ~1 week to see if they were contributing to her leg cramps. However, she notes that her cramps returned even when off the medications, so she restarted her medications. She notes that her husband is on Trulicity, and she wonders if this would be a good option for her.  Upon medication allergy review, she notes that she had "cramping" with atorvastatin and rosuvastatin, but now isn't sure if it was related to the medications as the cramping has continued.   Patient reports Diabetes was diagnosed at least 5-6 years ago.   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 Patient reports hyperglycemic symptoms including, nocturia, neuropathy, blurred vision. Patient denies self foot exams.   Patient reported dietary habits:  does not eat meals at work; Breakfast: Scrambled egg sandwich, glass of milk;  Lunch: typically doesn't eat lunch; potentially chicken salad on bread  Dinner:  Meatloaf, sweet potato casserole, pasta salad;  Snacks: Grabs quick things at work that can be eaten quickly - crackers, chips Drinks: water, coke zero and sprite zero  Patient reported exercise habits: limited by busy work schedule, neuropathy in feet   O:  Physical Exam  Constitutional: She appears well-developed and well-nourished.  Vitals reviewed.   Review of Systems  All other systems reviewed and are negative.    Lab Results  Component Value Date   HGBA1C 9.9 (H) 03/15/2018   Vitals:   08/05/18 0827  BP: 118/82  Pulse: 80    Basic Metabolic Panel BMP Latest Ref Rng & Units 01/14/2018 07/03/2017 03/29/2017  Glucose 65 - 99 mg/dL 165(H) 148(H) 289(H)  BUN 8 - 27 mg/dL _0 Creatinine 0.57 - 1.00 mg/dL 0.61 0.56(L) 0.72  BUN/Creat Ratio 12 - 28 25 29(H) 25  Sodium 134 - 144 mmol/L 139 143 140  Potassium 3.5 - 5.2 mmol/L 4.9 4.5 4.9  Chloride 96 - 106 mmol/L 101 101 101  CO2 20 - 29 mmol/L _1 Calcium 8.7 - 10.3 mg/dL 9.1 9.8 9.5   eGFR ~ 99 mL/min/1.23m; CrCl ~ 73 mL/min  Lipid Panel     Component Value Date/Time   CHOL 240 (H) 03/15/2018 0841   TRIG 113 03/15/2018 0841   HDL 53 03/15/2018 0841   CHOLHDL 4.5 (H) 03/15/2018 0841   CHOLHDL 4 07/24/2016 1129   VLDL 16.4 07/24/2016 1129   LDLCALC 164 (H) 03/15/2018 0841    Fasting SMBG: 170s-high 200s 2 hour post-prandial/random SMBG: not checking  Clinical ASCVD: No ;  ASCVD risk factors: age 934-7510 year ASCVD risk: <  20%  A/P: Following discussion and approval by Dr. Derrel Nip, the following medication changes were made:   #Diabetes - Currently uncontrolled, most recent A1c 9.9% on 03/15/2018, stable from 9.8% on 01/14/2018; Goal A1c <7%. Patient reports occasional hypoglycemic symptoms in the midmorning. Stomach knot with Victoza not consistent with GI upset that would limit tolerability with other GLP1 agents. Will start Trulicity per patient request; appropriate to reduce glimepiride to reduce  risk of hypoglycemia - Start Trulicity 9.35 mg weekly.  - Decrease glimepiride to 2 mg with breakfast.  - Recommended patient continue to check fasting SMBG daily, additionally check 2 hour post prandial SMBG after the largest meal   - Demonstrated Trulicity injection technique, counseled on common side effects  #Hypertension currently controlled. Goal BP <140/90.  - Continue to monitor for necessity of restarting ACEi/ARB  #ASCVD risk - primary prevention in patient aged 92-75 with DM, ASCVD risk score <20%; moderate intensity statin indicated.  - Start rosuvastatin 10 mg daily.  - Counseled patient on difference between "muscle cramps" and typical statin associated muscle effects - Recheck lipid panel in 6-8 weeks   Written patient instructions provided.  Total time in face to face counseling 40 minutes.    Follow up in Pharmacist Clinic Visit 4 weeks.    De Hollingshead, PharmD PGY2 Ambulatory Care Pharmacy Resident Phone: [phone]  Agree with plan. Mable Paris, NP

## 2018-08-05 NOTE — Assessment & Plan Note (Signed)
#  Diabetes - Currently uncontrolled, most recent A1c 9.9% on 03/15/2018, stable from 9.8% on 01/14/2018; Goal A1c <7%. Patient reports occasional hypoglycemic symptoms in the midmorning. Stomach knot with Victoza not consistent with GI upset that would limit tolerability with other GLP1 agents. Will start Trulicity per patient request; appropriate to reduce glimepiride to reduce risk of hypoglycemia - Start Trulicity 0.75 mg weekly.  - Decrease glimepiride to 2 mg with breakfast.  - Recommended patient continue to check fasting SMBG daily, additionally check 2 hour post prandial SMBG after the largest meal   - Demonstrated Trulicity injection technique, counseled on common side effects

## 2018-08-05 NOTE — Assessment & Plan Note (Signed)
#  ASCVD risk - primary prevention in patient aged 61-75 with DM, ASCVD risk score <20%; moderate intensity statin indicated.  - Start rosuvastatin 10 mg daily.  - Recheck lipid panel in 6-8 weeks

## 2018-08-07 ENCOUNTER — Other Ambulatory Visit: Payer: Self-pay | Admitting: Family

## 2018-08-07 DIAGNOSIS — E119 Type 2 diabetes mellitus without complications: Secondary | ICD-10-CM

## 2018-08-09 NOTE — Telephone Encounter (Signed)
Last office visit with Brittany [name] 08/05/18 glimepiride changed to 2 mg

## 2018-08-12 MED ORDER — GLIMEPIRIDE 2 MG PO TABS: 2.0000 mg | ORAL_TABLET | Freq: Every day | ORAL | 1 refills | Status: DC

## 2018-08-12 NOTE — Telephone Encounter (Signed)
Call pt I sent in glimepiride 2mg  ( had been 4mg ) Since pharmacy decreased it at last OV

## 2018-09-02 ENCOUNTER — Ambulatory Visit (INDEPENDENT_AMBULATORY_CARE_PROVIDER_SITE_OTHER): Payer: 59 | Admitting: Pharmacist

## 2018-09-02 ENCOUNTER — Encounter: Payer: Self-pay | Admitting: Pharmacist

## 2018-09-02 DIAGNOSIS — E119 Type 2 diabetes mellitus without complications: Secondary | ICD-10-CM | POA: Diagnosis not present

## 2018-09-02 DIAGNOSIS — E785 Hyperlipidemia, unspecified: Secondary | ICD-10-CM

## 2018-09-02 DIAGNOSIS — E1165 Type 2 diabetes mellitus with hyperglycemia: Secondary | ICD-10-CM

## 2018-09-02 DIAGNOSIS — E1140 Type 2 diabetes mellitus with diabetic neuropathy, unspecified: Secondary | ICD-10-CM | POA: Diagnosis not present

## 2018-09-02 DIAGNOSIS — IMO0002 Reserved for concepts with insufficient information to code with codable children: Secondary | ICD-10-CM

## 2018-09-02 MED ORDER — ROSUVASTATIN CALCIUM 10 MG PO TABS: 5.0000 mg | ORAL_TABLET | Freq: Every day | ORAL | 3 refills | Status: DC

## 2018-09-02 MED ORDER — DULAGLUTIDE 0.75 MG/0.5ML ~~LOC~~ SOAJ: 1.5000 mg | SUBCUTANEOUS | 3 refills | Status: DC

## 2018-09-02 NOTE — Assessment & Plan Note (Signed)
#  ASCVD risk - primary prevention in patient aged 61-75 with DM, ASCVD risk score <20%; moderate intensity statin indicated. Headache is not common s/x of statin therapy; will reduce dose to see if tolerated - Decrease rosuvastatin to 5 mg daily. Patient can split tablets in half.  - Recheck lipid panel at next appointment, either pharmacy or PCP - Moving forward, consider necessity of continuing aspirin therapy for primary prevention in this low risk patient

## 2018-09-02 NOTE — Assessment & Plan Note (Signed)
#  Diabetes - Currently uncontrolled but improved per SMBG results; most recent A1c 9.9% on 03/15/2018, stable from 9.8% on 01/14/2018; Goal A1c <7%. Appropriate to continue to titrate Trulicity therapy to target goal fasting BG of 80-130.  - Increase Trulicity to 1.5 mg weekly.  - Continue metformin XR 2000 mg daily and glimepiride 2 mg daily. Counseled patient that she can cut 4 mg tablets in half. Also counseled patient that she can discontinue glimepiride if she starts to experience symptoms of hypoglycemia in mid-late morning.  - Recommended patient continue to check fasting SMBG daily, additionally check 2 hour post prandial SMBG after the largest meal   - Will plan to check A1c in January at follow up to allow for full benefit of medication adjustments to be seen

## 2018-09-02 NOTE — Progress Notes (Addendum)
S:     Chief Complaint  Patient presents with  . Medication Management    Diabetes    Patient arrives in good spirits, ambulating without assistance.  Presents for diabetes evaluation, education, and management at the request of Dr. Derrel Nip at last appointment on 06/12/2018. Last seen in pharmacy clinic on 08/05/2018 - at that time, Trulicity 0.16 mg weekly and rosuvastatin 10 mg daily were initiated. Glimepiride was decreased to 2 mg daily to proactively reduce the risk of hypoglycemia.   Today, she reports improvement in blood sugars and in nocturia - she is now only going once a night, if at all. She denies any GI upset concerns, and endorses some appetite suppression and getting full with smaller sized meals. She notes that she has continued to take the glimepiride 4 mg daily dose because she wanted to finish out that supply, and wasn't sure if she could split that tablet. She does note a "headache" when she wakes up after taking rosuvastatin 10 mg, and tried a few days of NOT taking rosuvastatin and the headache did not occur. She notes that she had stopped taking aspirin for a few days because her puppies were cutting her arm.   Patient reports Diabetes was diagnosed at least 5-6 years ago.   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 Patient reports improvement in hyperglycemic symptoms including, nocturia, neuropathy, blurred vision. Patient denies self foot exams.   Patient reported dietary habits:  does not eat meals at work; Breakfast: Scrambled egg sandwich, glass of milk;  Lunch: typically  doesn't eat lunch; potentially chicken salad on bread  Dinner: Meatloaf, sweet potato casserole, pasta salad;  Snacks: Grabs quick things at work that can be eaten quickly - crackers, chips Drinks: water, coke zero and sprite zero  Patient reported exercise habits: limited by busy work schedule, neuropathy in feet   O:  Physical Exam  Constitutional: She appears well-developed and well-nourished.  Vitals reviewed.   Review of Systems  All other systems reviewed and are negative.    Lab Results  Component Value Date   HGBA1C 9.9 (H) 03/15/2018   There were no vitals filed for this visit.  Basic Metabolic Panel BMP Latest Ref Rng & Units 01/14/2018 07/03/2017 03/29/2017  Glucose 65 - 99 mg/dL 165(H) 148(H) 289(H)  BUN 8 - 27 mg/dL _0 Creatinine 0.57 - 1.00 mg/dL 0.61 0.56(L) 0.72  BUN/Creat Ratio 12 - 28 25 29(H) 25  Sodium 134 - 144 mmol/L 139 143 140  Potassium 3.5 - 5.2 mmol/L 4.9 4.5 4.9  Chloride 96 - 106 mmol/L 101 101 101  CO2 20 - 29 mmol/L _1 Calcium 8.7 - 10.3 mg/dL 9.1 9.8 9.5   eGFR ~ 99 mL/min/1.40m; CrCl ~ 73 mL/min  Lipid Panel     Component Value Date/Time   CHOL 240 (H) 03/15/2018 0841   TRIG 113 03/15/2018 0841   HDL 53 03/15/2018 0841   CHOLHDL 4.5 (H) 03/15/2018 0841   CHOLHDL 4 07/24/2016 1129   VLDL 16.4 07/24/2016 1129   LDLCALC 164 (H) 03/15/2018 0841    Fasting SMBG: 120s-150s 2 hour post-prandial/random SMBG: not checking   Clinical ASCVD: No ;  ASCVD risk factors: age 28-75 10 year ASCVD risk: <20%  A/P: Following discussion and approval by Dr. Derrel Nip, the following medication changes were made:   #Diabetes - Currently uncontrolled but improved per SMBG results; most recent A1c 9.9% on 03/15/2018, stable from 9.8% on 01/14/2018; Goal A1c <7%. Appropriate to continue to titrate Trulicity therapy to target goal fasting BG of 80-130.  - Increase Trulicity to 1.5 mg weekly.  - Continue metformin XR 2000 mg daily and  glimepiride 2 mg daily. Counseled patient that she can cut 4 mg tablets in half. Also counseled patient that she can discontinue glimepiride if she starts to experience symptoms of hypoglycemia in mid-late morning.  - Recommended patient continue to check fasting SMBG daily, additionally check 2 hour post prandial SMBG after the largest meal   - Will plan to check A1c in January at follow up to allow for full benefit of medication adjustments to be seen  #Hypertension currently controlled. Goal BP <140/90.  - Continue to monitor for necessity of restarting ACEi/ARB  #ASCVD risk - primary prevention in patient aged 30-75 with DM, ASCVD risk score <20%; moderate intensity statin indicated. Headache is not common s/x of statin therapy; will reduce dose to see if tolerated - Decrease rosuvastatin to 5 mg daily. Patient can split tablets in half.  - Recheck lipid panel at next appointment, either pharmacy or PCP - Moving forward, consider necessity of continuing aspirin therapy for primary prevention in this low risk patient  Written patient instructions provided.  Total time in face to face counseling 30 minutes.    Follow up in Pharmacist Clinic Visit 8 weeks  De Hollingshead, PharmD PGY2 Ambulatory Care Pharmacy Resident Phone: [phone] Agree with plan. Mable Paris, NP

## 2018-09-02 NOTE — Patient Instructions (Signed)
It was great to see you today! I am so glad to hear how well things are going with your diabetes control.   We are going to adjust a few things today: 1) When you finish your current supply of Trulicity 0.75 mg once weekly, increase to Trulicity 1.5 mg once weekly. I will send that prescription to your pharmacy.  2) You can cut the glimepiride 4 mg tablets in half to 2 mg. If you start to experience symptoms of low blood sugars (shakiness, sweating, dizziness) in the late morning, feel free to stop the glimepiride altogether.  3) Try cutting the rosuvastatin (generic Crestor) in half and taking 5 mg once daily. Feel free to call into clinic in a few weeks if the headaches don't go away, and we can see about adjusting the dose further.    Continue metformin 2000 mg daily. Keep up the great work checking your blood sugars in the morning (goal= 80-130). If you think about it, try checking your blood sugar about 2 hours after the largest meal of you day (goal less than 180).    Schedule follow up with me in the beginning of January. Feel free to call clinic with any questions or concerns!

## 2018-09-09 ENCOUNTER — Ambulatory Visit: Payer: 59 | Admitting: Pharmacist

## 2018-10-03 ENCOUNTER — Other Ambulatory Visit: Payer: Self-pay | Admitting: Family

## 2018-10-03 ENCOUNTER — Telehealth: Payer: Self-pay | Admitting: Family

## 2018-10-03 DIAGNOSIS — E119 Type 2 diabetes mellitus without complications: Secondary | ICD-10-CM

## 2018-10-03 MED ORDER — DULAGLUTIDE 1.5 MG/0.5ML ~~LOC~~ SOAJ: 1.5000 mg | SUBCUTANEOUS | 3 refills | Status: DC

## 2018-10-03 NOTE — Telephone Encounter (Signed)
See new dosage prescription to be printed at PCP for patient pickup.

## 2018-10-03 NOTE — Telephone Encounter (Signed)
Corrected dosage per 09/02/18 OV note. Discontinued previous dosage with pharmacy. Routing to PCP to print for patient.

## 2018-10-03 NOTE — Telephone Encounter (Signed)
[facility]

## 2018-10-04 ENCOUNTER — Encounter: Payer: Self-pay | Admitting: Family

## 2018-10-04 ENCOUNTER — Telehealth: Payer: Self-pay | Admitting: Family

## 2018-10-04 NOTE — Telephone Encounter (Signed)
Call patient Please inform her of refilled her Trulicity.  Please ensure and document her chart ( family history) that she has NO personal OR family history of thyroid cancer.  This would be a major contraindication to being on this medication. Please ensure also that she is a full appointment scheduled with me.

## 2018-10-04 NOTE — Telephone Encounter (Signed)
Pt picked up prescription for Trulicity. Pt states no family history or her self of thyroid cancer.

## 2018-10-04 NOTE — Telephone Encounter (Signed)
Tried calling patient, no answer, no voicmeail box has been set up.

## 2018-10-09 ENCOUNTER — Telehealth: Payer: Self-pay | Admitting: *Deleted

## 2018-10-09 NOTE — Telephone Encounter (Signed)
TC to patient to set up appointment with Dr. Darrick Huntsmanullo as physician requested. (fowwow-up on Trulicity) No answer and VM not available with her number.

## 2018-10-23 ENCOUNTER — Other Ambulatory Visit: Payer: Self-pay | Admitting: Family

## 2018-10-23 DIAGNOSIS — K2100 Gastro-esophageal reflux disease with esophagitis, without bleeding: Secondary | ICD-10-CM

## 2018-11-11 ENCOUNTER — Other Ambulatory Visit: Payer: Self-pay | Admitting: Family

## 2018-11-11 DIAGNOSIS — M19041 Primary osteoarthritis, right hand: Secondary | ICD-10-CM

## 2018-11-27 ENCOUNTER — Other Ambulatory Visit: Payer: Self-pay | Admitting: Family

## 2018-11-27 MED ORDER — DULAGLUTIDE 1.5 MG/0.5ML ~~LOC~~ SOAJ: 1.5000 mg | SUBCUTANEOUS | 0 refills | Status: DC

## 2018-11-27 NOTE — Telephone Encounter (Signed)
Patient has appointment 12/02/18 Requested Prescriptions  Pending Prescriptions Disp Refills  . Dulaglutide 1.5 MG/0.5ML SOPN 12 pen 0    Sig: Inject 1.5 mg as directed once a week. Inject 1.5mg /.49ml weekly     Endocrinology:  Diabetes - GLP-1 Receptor Agonists Failed - 11/27/2018 12:30 PM      Failed - HBA1C is between 0 and 7.9 and within 180 days    Hgb A1c MFr Bld  Date Value Ref Range Status  03/15/2018 9.9 (H) 4.8 - 5.6 % Final    Comment:             Prediabetes: 5.7 - 6.4          Diabetes: >6.4          Glycemic control for adults with diabetes: <7.0          Passed - Valid encounter within last 6 months    Recent Outpatient Visits          2 months ago Type 2 diabetes mellitus without complication, without long-term current use of insulin [facility])   [facility] [place], Thompson E, [place]   3 months ago Type 2 diabetes mellitus without complication, without long-term current use of insulin [facility])   [facility] Lourena Simmonds, [place]   5 months ago Recurrent falls while walking   [facility] Sherlene Shams, [place]   8 months ago Essential hypertension   [facility] [place], Lyn Records, FNP   10 months ago Type 2 diabetes, uncontrolled, with neuropathy [facility])   [facility] [place] Arnett, Lyn Records, FNP      Future Appointments            In 5 days Arnett, Lyn Records, FNP [facility]

## 2018-11-27 NOTE — Telephone Encounter (Signed)
Copied from CRM [id number]. Topic: Quick Communication - Rx Refill/Question >> Nov 27, 2018 12:25 PM Lynne Logan D wrote: Medication: Trulicity/ pt stated that her insurance will now only cover 90 day supply for rx. Please advise.  Has the patient contacted their pharmacy? Yes.   (Agent: If no, request that the patient contact the pharmacy for the refill.) (Agent: If yes, when and what did the pharmacy advise?)  Preferred Pharmacy (with phone number or street name[facility] [address] [place] - [address] [phone] (Phone) [phone] (Fax)  Agent: Please be advised that RX refills may take up to 3 business days. We ask that you follow-up with your pharmacy.

## 2018-12-02 ENCOUNTER — Ambulatory Visit (INDEPENDENT_AMBULATORY_CARE_PROVIDER_SITE_OTHER): Payer: 59 | Admitting: Family

## 2018-12-02 ENCOUNTER — Encounter: Payer: Self-pay | Admitting: Family

## 2018-12-02 VITALS — BP 122/76 | HR 91 | Temp 98.4°F | Wt 157.8 lb

## 2018-12-02 DIAGNOSIS — E1140 Type 2 diabetes mellitus with diabetic neuropathy, unspecified: Secondary | ICD-10-CM

## 2018-12-02 DIAGNOSIS — IMO0002 Reserved for concepts with insufficient information to code with codable children: Secondary | ICD-10-CM

## 2018-12-02 DIAGNOSIS — I10 Essential (primary) hypertension: Secondary | ICD-10-CM

## 2018-12-02 DIAGNOSIS — E785 Hyperlipidemia, unspecified: Secondary | ICD-10-CM

## 2018-12-02 DIAGNOSIS — R5383 Other fatigue: Secondary | ICD-10-CM

## 2018-12-02 DIAGNOSIS — E1165 Type 2 diabetes mellitus with hyperglycemia: Secondary | ICD-10-CM | POA: Diagnosis not present

## 2018-12-02 NOTE — Patient Instructions (Addendum)
Please only take mobic AS NEEDED versus everyday as this is a strong antiinflammatory.   May consider lisinopril and aspiriin 81mg  in the future for prevention of cardiovascular events.   Exercise is the best way to increase your energy- please start a walking program.   Nice to see you!

## 2018-12-02 NOTE — Assessment & Plan Note (Addendum)
Suspect improved , pending a1c.  Of note, advised 81 mg aspirin would be appropriate due to her elevated risk of cardiovascular disease.  She declines at this time

## 2018-12-02 NOTE — Progress Notes (Signed)
Subjective:    Patient ID: Brittany [name], female    DOB: 1956/07/19, 63 y.o.   MRN: [id number]  CC: Brittany [name] is a 63 y.o. female who presents today for follow up.   HPI: HLD- crestor 59m daily, doing well.   DM- increased trulicity 144/0347 On glimepiride. No longer on metformin. hasnt been checking blood sugar as Glucometer is not working.   HTN- no longer on lisinopril as caused legs to hurt.   Doing well on cymbalta. No longer on mobic. Taking mobic prn for hip pain ( h/o right hip replacement) . No depression. No si/hi.   Reports fatigue, working 13 hour shifts at [employer], 5 -6 days per week. Takes a nap prior to going into work. No trouble falling aslepp.  Doesn't snore. No HA.   No longer on ASA due to falls in the past. No recent falls.      HISTORY:  Past Medical History:  Diagnosis Date  . Diabetes mellitus without complication ([facility]   . Hyperlipidemia   . Hypertension    pt states on medication as prevenative  . Obesity    Past Surgical History:  Procedure Laterality Date  . ABDOMINAL HYSTERECTOMY    . COLONOSCOPY WITH PROPOFOL N/A 06/25/2015   Procedure: COLONOSCOPY WITH PROPOFOL;  Surgeon: DLucilla Lame MD;  [facility]  Service: Endoscopy;  Laterality: N/A;  Diabetic - oral meds  . ELBOW SURGERY Left   . HIP SURGERY Right    twice   Family History  Problem Relation Age of Onset  . Diabetes Mother   . Cancer Father        unknown; '90 % cancer'  . Diabetes Sister   . Diabetes Brother   . Breast cancer Cousin        unknown age at dx.  . Thyroid cancer Neg Hx     Allergies: Jardiance [empagliflozin]; Atorvastatin; and Victoza [liraglutide] Current Outpatient Medications on File Prior to Visit  Medication Sig Dispense Refill  . blood glucose meter kit and supplies Dispense based on patient and insurance preference. Use up to four times daily as directed. (FOR ICD-10 E10.9, E11.9). 1 each 0  . Dulaglutide 1.5 MG/0.5ML SOPN Inject  1.5 mg as directed once a week. Inject 1.570m.05ml weekly 12 pen 0  . DULoxetine (CYMBALTA) 60 MG capsule TAKE 1 CAPSULE BY MOUTH EVERY DAY 90 capsule 1  . glimepiride (AMARYL) 2 MG tablet Take 1 tablet (2 mg total) by mouth daily with breakfast. 90 tablet 1  . glucose blood (ONE TOUCH ULTRA TEST) test strip USE TO CHECK BLOOD GLUCOSE TWICE A DAY 100 each 1  . meloxicam (MOBIC) 15 MG tablet TAKE 1 TABLET (15 MG TOTAL) BY MOUTH DAILY. 90 tablet 0  . omeprazole (PRILOSEC) 40 MG capsule TAKE 1 CAPSULE (40 MG TOTAL) BY MOUTH DAILY AS NEEDED. 90 capsule 1  . rosuvastatin (CRESTOR) 10 MG tablet Take 0.5 tablets (5 mg total) by mouth daily. Dose decrease. 90 tablet 3   No current facility-administered medications on file prior to visit.     Social History   Tobacco Use  . Smoking status: Never Smoker  . Smokeless tobacco: Never Used  Substance Use Topics  . Alcohol use: Yes    Alcohol/week: 0.0 standard drinks    Comment: 1 times a year-wine  . Drug use: No    Review of Systems  Constitutional: Positive for fatigue. Negative for chills and fever.  Respiratory: Negative for cough.  Cardiovascular: Negative for chest pain and palpitations.  Gastrointestinal: Negative for nausea and vomiting.  Musculoskeletal: Positive for arthralgias (right hip).  Neurological: Negative for headaches.  Psychiatric/Behavioral: Negative for sleep disturbance and suicidal ideas. The patient is not nervous/anxious.       Objective:    BP 122/76 (BP Location: Left Arm, Patient Position: Sitting, Cuff Size: Large)   Pulse 91   Temp 98.4 F (36.9 C)   Wt 157 lb 12.8 oz (71.6 kg)   SpO2 95%   BMI 29.82 kg/m  BP Readings from Last 3 Encounters:  12/02/18 122/76  09/02/18 131/85  08/05/18 118/82   Wt Readings from Last 3 Encounters:  12/02/18 157 lb 12.8 oz (71.6 kg)  09/02/18 159 lb 12.8 oz (72.5 kg)  08/05/18 156 lb 3.2 oz (70.9 kg)    Physical Exam Vitals signs reviewed.  Constitutional:       Appearance: She is well-developed.  Eyes:     Conjunctiva/sclera: Conjunctivae normal.  Cardiovascular:     Rate and Rhythm: Normal rate and regular rhythm.     Pulses: Normal pulses.     Heart sounds: Normal heart sounds.  Pulmonary:     Effort: Pulmonary effort is normal.     Breath sounds: Normal breath sounds. No wheezing, rhonchi or rales.  Skin:    General: Skin is warm and dry.  Neurological:     Mental Status: She is alert.  Psychiatric:        Speech: Speech normal.        Behavior: Behavior normal.        Thought Content: Thought content normal.        Assessment & Plan:   Problem List Items Addressed This Visit      Cardiovascular and Mediastinum   Essential hypertension    Discussed trialing lisinopril again however patient declines.  She politely finds also an ARB in setting of diabetes.  We will continue to follow.      Relevant Orders   Comprehensive metabolic panel     Endocrine   Type 2 diabetes, uncontrolled, with neuropathy ([place]) - Primary    Suspect improved , pending a1c.  Of note, advised 81 mg aspirin would be appropriate due to her elevated risk of cardiovascular disease.  She declines at this time      Relevant Orders   Hemoglobin A1c     Other   Hyperlipidemia    Pending lipid panel      Relevant Orders   Lipid panel   Fatigue    Etiology unclear at this time, suspect multifactorial including 13-hour shifts at work, lack of exercise.  Patient declines sleep study at this time.  Pending labs.      Relevant Orders   TSH   CBC with Differential/Platelet   VITAMIN D 25 Hydroxy (Vit-D Deficiency, Fractures)       I have discontinued Brittany [name]. Brittany [name]'s aspirin and metFORMIN. I am also having her maintain her glucose blood, blood glucose meter kit and supplies, DULoxetine, glimepiride, rosuvastatin, omeprazole, meloxicam, and Dulaglutide.   No orders of the defined types were placed in this encounter.   Return precautions given.     Risks, benefits, and alternatives of the medications and treatment plan prescribed today were discussed, and patient expressed understanding.   Education regarding symptom management and diagnosis given to patient on AVS.  Continue to follow with Brittany Hawthorne, FNP for routine health maintenance.   Linard Millers and I agreed with  plan.   [name] Arnett, FNP   

## 2018-12-02 NOTE — Assessment & Plan Note (Signed)
Etiology unclear at this time, suspect multifactorial including 13-hour shifts at work, lack of exercise.  Patient declines sleep study at this time.  Pending labs.

## 2018-12-02 NOTE — Assessment & Plan Note (Signed)
Discussed trialing lisinopril again however patient declines.  She politely finds also an ARB in setting of diabetes.  We will continue to follow.

## 2018-12-02 NOTE — Assessment & Plan Note (Signed)
Pending lipid panel 

## 2018-12-03 LAB — COMPREHENSIVE METABOLIC PANEL
ALT: 34 IU/L — ABNORMAL HIGH (ref 0–32)
AST: 18 IU/L (ref 0–40)
Albumin/Globulin Ratio: 1.9 (ref 1.2–2.2)
Albumin: 4.6 g/dL (ref 3.8–4.8)
Alkaline Phosphatase: 100 IU/L (ref 39–117)
BUN/Creatinine Ratio: 22 (ref 12–28)
BUN: 15 mg/dL (ref 8–27)
Bilirubin Total: 0.4 mg/dL (ref 0.0–1.2)
CHLORIDE: 101 mmol/L (ref 96–106)
CO2: 23 mmol/L (ref 20–29)
Calcium: 9.8 mg/dL (ref 8.7–10.3)
Creatinine, Ser: 0.67 mg/dL (ref 0.57–1.00)
GFR calc Af Amer: 110 mL/min/{1.73_m2} (ref >59)
GFR calc non Af Amer: 95 mL/min/{1.73_m2} (ref >59)
GLUCOSE: 243 mg/dL — AB (ref 65–99)
Globulin, Total: 2.4 g/dL (ref 1.5–4.5)
Potassium: 5.1 mmol/L (ref 3.5–5.2)
Sodium: 144 mmol/L (ref 134–144)
Total Protein: 7 g/dL (ref 6.0–8.5)

## 2018-12-03 LAB — HEMOGLOBIN A1C
Est. average glucose Bld gHb Est-mCnc: 223 mg/dL
Hgb A1c MFr Bld: 9.4 % — ABNORMAL HIGH (ref 4.8–5.6)

## 2018-12-03 LAB — TSH: TSH: 0.139 u[IU]/mL — ABNORMAL LOW (ref 0.450–4.500)

## 2018-12-03 LAB — CBC WITH DIFFERENTIAL/PLATELET
BASOS ABS: 0 10*3/uL (ref 0.0–0.2)
Basos: 1 %
EOS (ABSOLUTE): 0.2 10*3/uL (ref 0.0–0.4)
Eos: 3 %
Hematocrit: 47.7 % — ABNORMAL HIGH (ref 34.0–46.6)
Hemoglobin: 15.8 g/dL (ref 11.1–15.9)
Immature Grans (Abs): 0.1 10*3/uL (ref 0.0–0.1)
Immature Granulocytes: 1 %
Lymphocytes Absolute: 1.5 10*3/uL (ref 0.7–3.1)
Lymphs: 26 %
MCH: 29.8 pg (ref 26.6–33.0)
MCHC: 33.1 g/dL (ref 31.5–35.7)
MCV: 90 fL (ref 79–97)
Monocytes Absolute: 0.6 10*3/uL (ref 0.1–0.9)
Monocytes: 9 %
Neutrophils Absolute: 3.6 10*3/uL (ref 1.4–7.0)
Neutrophils: 60 %
PLATELETS: 241 10*3/uL (ref 150–450)
RBC: 5.31 x10E6/uL — AB (ref 3.77–5.28)
RDW: 12.5 % (ref 11.7–15.4)
WBC: 5.9 10*3/uL (ref 3.4–10.8)

## 2018-12-03 LAB — LIPID PANEL
Chol/HDL Ratio: 3.6 ratio (ref 0.0–4.4)
Cholesterol, Total: 175 mg/dL (ref 100–199)
HDL: 49 mg/dL (ref >39)
LDL Calculated: 94 mg/dL (ref 0–99)
Triglycerides: 159 mg/dL — ABNORMAL HIGH (ref 0–149)
VLDL Cholesterol Cal: 32 mg/dL (ref 5–40)

## 2018-12-03 LAB — VITAMIN D 25 HYDROXY (VIT D DEFICIENCY, FRACTURES): VIT D 25 HYDROXY: 19.2 ng/mL — AB (ref 30.0–100.0)

## 2018-12-06 ENCOUNTER — Other Ambulatory Visit: Payer: Self-pay | Admitting: Family

## 2018-12-06 DIAGNOSIS — R7989 Other specified abnormal findings of blood chemistry: Secondary | ICD-10-CM

## 2018-12-06 DIAGNOSIS — E11319 Type 2 diabetes mellitus with unspecified diabetic retinopathy without macular edema: Secondary | ICD-10-CM

## 2018-12-09 ENCOUNTER — Other Ambulatory Visit: Payer: Self-pay | Admitting: Family

## 2018-12-09 DIAGNOSIS — E119 Type 2 diabetes mellitus without complications: Secondary | ICD-10-CM

## 2018-12-09 MED ORDER — ROSUVASTATIN CALCIUM 10 MG PO TABS: 10.0000 mg | ORAL_TABLET | Freq: Every day | ORAL | 3 refills | Status: DC

## 2018-12-11 ENCOUNTER — Telehealth: Payer: Self-pay

## 2018-12-11 NOTE — Telephone Encounter (Signed)
Copied from CRM [id number]. Topic: General - Inquiry >> Dec 11, 2018  4:42 PM Terisa Starr wrote: Reason for CRM: patient is returning Sarah's call from earlier. Please call back

## 2018-12-12 NOTE — Telephone Encounter (Signed)
I have spoken with patient. See result note 12/12/18.

## 2018-12-16 ENCOUNTER — Other Ambulatory Visit: Payer: Self-pay | Admitting: Family

## 2018-12-16 DIAGNOSIS — E1165 Type 2 diabetes mellitus with hyperglycemia: Principal | ICD-10-CM

## 2018-12-16 DIAGNOSIS — E1140 Type 2 diabetes mellitus with diabetic neuropathy, unspecified: Secondary | ICD-10-CM

## 2018-12-16 DIAGNOSIS — IMO0002 Reserved for concepts with insufficient information to code with codable children: Secondary | ICD-10-CM

## 2018-12-24 ENCOUNTER — Other Ambulatory Visit (INDEPENDENT_AMBULATORY_CARE_PROVIDER_SITE_OTHER): Payer: 59

## 2018-12-24 DIAGNOSIS — E11319 Type 2 diabetes mellitus with unspecified diabetic retinopathy without macular edema: Secondary | ICD-10-CM

## 2018-12-24 DIAGNOSIS — R7989 Other specified abnormal findings of blood chemistry: Secondary | ICD-10-CM

## 2018-12-25 ENCOUNTER — Other Ambulatory Visit: Payer: Self-pay | Admitting: Family

## 2018-12-25 DIAGNOSIS — R7989 Other specified abnormal findings of blood chemistry: Secondary | ICD-10-CM

## 2018-12-25 LAB — COMPREHENSIVE METABOLIC PANEL
ALT: 27 IU/L (ref 0–32)
AST: 16 IU/L (ref 0–40)
Albumin/Globulin Ratio: 1.7 (ref 1.2–2.2)
Albumin: 4.6 g/dL (ref 3.8–4.8)
Alkaline Phosphatase: 104 IU/L (ref 39–117)
BUN/Creatinine Ratio: 36 — ABNORMAL HIGH (ref 12–28)
BUN: 23 mg/dL (ref 8–27)
Bilirubin Total: 0.5 mg/dL (ref 0.0–1.2)
CO2: 21 mmol/L (ref 20–29)
Calcium: 9.9 mg/dL (ref 8.7–10.3)
Chloride: 104 mmol/L (ref 96–106)
Creatinine, Ser: 0.64 mg/dL (ref 0.57–1.00)
GFR calc Af Amer: 111 mL/min/{1.73_m2} (ref >59)
GFR calc non Af Amer: 97 mL/min/{1.73_m2} (ref >59)
Globulin, Total: 2.7 g/dL (ref 1.5–4.5)
Glucose: 248 mg/dL — ABNORMAL HIGH (ref 65–99)
POTASSIUM: 4.4 mmol/L (ref 3.5–5.2)
Sodium: 144 mmol/L (ref 134–144)
Total Protein: 7.3 g/dL (ref 6.0–8.5)

## 2018-12-25 LAB — T3, FREE: T3, Free: 2.7 pg/mL (ref 2.0–4.4)

## 2018-12-25 LAB — T4, FREE: Free T4: 1.27 ng/dL (ref 0.82–1.77)

## 2018-12-25 LAB — TSH: TSH: 0.114 u[IU]/mL — AB (ref 0.450–4.500)

## 2019-01-06 DIAGNOSIS — E782 Mixed hyperlipidemia: Secondary | ICD-10-CM | POA: Diagnosis not present

## 2019-01-06 DIAGNOSIS — I10 Essential (primary) hypertension: Secondary | ICD-10-CM | POA: Diagnosis not present

## 2019-01-06 DIAGNOSIS — E1165 Type 2 diabetes mellitus with hyperglycemia: Secondary | ICD-10-CM | POA: Diagnosis not present

## 2019-01-18 ENCOUNTER — Other Ambulatory Visit: Payer: Self-pay | Admitting: Family

## 2019-01-18 DIAGNOSIS — IMO0002 Reserved for concepts with insufficient information to code with codable children: Secondary | ICD-10-CM

## 2019-01-18 DIAGNOSIS — E1165 Type 2 diabetes mellitus with hyperglycemia: Principal | ICD-10-CM

## 2019-01-18 DIAGNOSIS — E1140 Type 2 diabetes mellitus with diabetic neuropathy, unspecified: Secondary | ICD-10-CM

## 2019-01-22 ENCOUNTER — Telehealth: Payer: Self-pay | Admitting: Family Medicine

## 2019-01-22 NOTE — Telephone Encounter (Signed)
Patient has no upcoming followup scheduled  Please call to schedule follow up  Last here in 11/2018, should return in May or June 2020  Due for:  A1c and other labs  Lab Results  Component Value Date   HGBA1C 9.4 (H) 12/02/2018   Foot exam  Urine microalbumin  Mammogram -- last one in epic is from 2018

## 2019-01-22 NOTE — Telephone Encounter (Signed)
I tried to call patient, but was unable to leave message due to it being full.

## 2019-01-28 ENCOUNTER — Other Ambulatory Visit: Payer: Self-pay | Admitting: Family

## 2019-01-28 DIAGNOSIS — E119 Type 2 diabetes mellitus without complications: Secondary | ICD-10-CM

## 2019-01-28 NOTE — Telephone Encounter (Signed)
Last OV 12/02/2018  Last refilled 08/12/2018 disp 90 with 1 refill   Next appt none scheduled   Sent to PCP to advise

## 2019-01-29 NOTE — Telephone Encounter (Signed)
Call pt What is correct dose amaryl? 2 or 4?  Please refill correct dose and correct meds on chart

## 2019-01-30 ENCOUNTER — Other Ambulatory Visit: Payer: Self-pay | Admitting: Family

## 2019-01-30 NOTE — Telephone Encounter (Signed)
Pt dosage is 4mg 

## 2019-01-30 NOTE — Telephone Encounter (Signed)
Tried to reach patient for correct dosage, patient line gives busy signal unable to leave message.

## 2019-02-05 ENCOUNTER — Ambulatory Visit (INDEPENDENT_AMBULATORY_CARE_PROVIDER_SITE_OTHER): Payer: 59 | Admitting: Family

## 2019-02-05 ENCOUNTER — Encounter: Payer: Self-pay | Admitting: Family

## 2019-02-05 DIAGNOSIS — R5383 Other fatigue: Secondary | ICD-10-CM

## 2019-02-05 DIAGNOSIS — I10 Essential (primary) hypertension: Secondary | ICD-10-CM

## 2019-02-05 DIAGNOSIS — E1140 Type 2 diabetes mellitus with diabetic neuropathy, unspecified: Secondary | ICD-10-CM | POA: Diagnosis not present

## 2019-02-05 DIAGNOSIS — IMO0002 Reserved for concepts with insufficient information to code with codable children: Secondary | ICD-10-CM

## 2019-02-05 DIAGNOSIS — E785 Hyperlipidemia, unspecified: Secondary | ICD-10-CM

## 2019-02-05 DIAGNOSIS — E1165 Type 2 diabetes mellitus with hyperglycemia: Secondary | ICD-10-CM

## 2019-02-05 NOTE — Assessment & Plan Note (Signed)
Has established endocrinology at this point, suspect improvement as she has increased glimepiride, restarted metformin and also started [place].  Will follow.  Pending A1C

## 2019-02-05 NOTE — Assessment & Plan Note (Addendum)
Patient is currently not checking at home.  Suspect controlled. she will send me blood pressure readings.  Continue regimen at this time

## 2019-02-05 NOTE — Progress Notes (Signed)
This visit type was conducted due to national recommendations for restrictions regarding the COVID-19 pandemic (e.g. social distancing).  This format is felt to be most appropriate for this patient at this time.  All issues noted in this document were discussed and addressed.  No physical exam was performed (except for noted visual exam findings with Video Visits). Virtual Visit via Video Note  I connected with@  on 02/05/19 at  8:00 AM EDT by a video enabled telemedicine application and verified that I am speaking with the correct person using two identifiers.  Location patient: home Location provider:work or home office Persons participating in the virtual visit: patient, provider  I discussed the limitations of evaluation and management by telemedicine and the availability of in person appointments. The patient expressed understanding and agreed to proceed.   HPI:  Feels well today, no complaints  HTN- compliant with medication. Not checking at home. At  Endocrinology blood pressure was 124/80.  Denies exertional chest pain or pressure, numbness or tingling radiating to left arm or jaw, palpitations, dizziness, frequent headaches, changes in vision, or shortness of breath.    Fatigue- unchanged. Somewhat improved with shorter shifts of late. On vacation this week.  Working in garden.   Has seen endocrinology approximately a month ago.  She will stay on Trulicity, glimepiride.  She will restart metformin. started [place].  Abnormal TSH was not addressed.  Last a1c 9.4. not checking blood sugars. Feels like sugar may be lower as eyes 'are not as blurry.' No polyuria, polyphagia.    HLD- taking crestor 60m from 529m    ROS: See pertinent positives and negatives per HPI.  Past Medical History:  Diagnosis Date  . Diabetes mellitus without complication ([facility]  . Hyperlipidemia   . Hypertension    pt states on medication as prevenative  . Obesity     Past Surgical History:   Procedure Laterality Date  . ABDOMINAL HYSTERECTOMY    . COLONOSCOPY WITH PROPOFOL N/A 06/25/2015   Procedure: COLONOSCOPY WITH PROPOFOL;  Surgeon: DaLucilla LameMD;  [place] Service: [place];  Laterality: N/A;  Diabetic - oral meds  . ELBOW SURGERY Left   . HIP SURGERY Right    twice    Family History  Problem Relation Age of Onset  . Diabetes Mother   . Cancer Father        unknown; '90 % cancer'  . Diabetes Sister   . Diabetes Brother   . Breast cancer Cousin        unknown age at dx.  . Thyroid cancer Neg Hx     SOCIAL HX: Never smoker   Current Outpatient Medications:  .  blood glucose meter kit and supplies, Dispense based on patient and insurance preference. Use up to four times daily as directed. (FOR ICD-10 E10.9, E11.9)., Disp: 1 each, Rfl: 0 .  Dulaglutide 1.5 MG/0.5ML SOPN, Inject 1.5 mg as directed once a week. Inject 1.42m72m042ml weekly, Disp: 12 pen, Rfl: 0 .  DULoxetine (CYMBALTA) 60 MG capsule, TAKE 1 CAPSULE BY MOUTH EVERY DAY, Disp: 90 capsule, Rfl: 1 .  glimepiride (AMARYL) 4 MG tablet, TAKE 1 TABLET (4 MG TOTAL) BY MOUTH DAILY WITH BREAKFAST., Disp: 90 tablet, Rfl: 1 .  glucose blood (ONE TOUCH ULTRA TEST) test strip, USE TO CHECK BLOOD GLUCOSE TWICE A DAY, Disp: 100 each, Rfl: 1 .  meloxicam (MOBIC) 15 MG tablet, TAKE 1 TABLET (15 MG TOTAL) BY MOUTH DAILY., Disp: 90 tablet, Rfl: 0 .  omeprazole (PRILOSEC) 40 MG capsule, TAKE 1 CAPSULE (40 MG TOTAL) BY MOUTH DAILY AS NEEDED., Disp: 90 capsule, Rfl: 1 .  rosuvastatin (CRESTOR) 10 MG tablet, Take 1 tablet (10 mg total) by mouth daily. Dose decrease., Disp: 90 tablet, Rfl: 3 .  FARXIGA 5 MG TABS tablet, Take 5 mg by mouth daily., Disp: , Rfl:   EXAM:  VITALS per patient if applicable:  GENERAL: alert, oriented, appears well and in no acute distress  HEENT: atraumatic, conjunttiva clear, no obvious abnormalities on inspection of external nose and ears  NECK: normal movements of the  head and neck  LUNGS: on inspection no signs of respiratory distress, breathing rate appears normal, no obvious gross SOB, gasping or wheezing  CV: no obvious cyanosis  MS: moves all visible extremities without noticeable abnormality  PSYCH/NEURO: pleasant and cooperative, no obvious depression or anxiety, speech and thought processing grossly intact  ASSESSMENT AND PLAN:  Discussed the following assessment and plan:  Type 2 diabetes, uncontrolled, with neuropathy ([facility]) - Plan: Comprehensive metabolic panel, Hemoglobin A1c, Lipid panel, T3, free, T4, free, TSH, CANCELED: Comprehensive metabolic panel, CANCELED: Hemoglobin A1c, CANCELED: T3, free, CANCELED: T4, free, CANCELED: TSH, CANCELED: Lipid panel  Essential hypertension  Fatigue, unspecified type  Hyperlipidemia, unspecified hyperlipidemia type  Problem List Items Addressed This Visit      Cardiovascular and Mediastinum   Essential hypertension    Patient is currently not checking at home.  Suspect controlled. she will send me blood pressure readings.  Continue regimen at this time        Endocrine   Type 2 diabetes, uncontrolled, with neuropathy ([place]) - Primary    Has established endocrinology at this point, suspect improvement as she has increased glimepiride, restarted metformin and also started [place].  Will follow.  Pending A1C      Relevant Medications   FARXIGA 5 MG TABS tablet   Other Relevant Orders   Comprehensive metabolic panel   Hemoglobin A1c   Lipid panel   T3, free   T4, free   TSH     Other   Hyperlipidemia    On increased dose of Crestor 10 mg ( from 67m). pending labs.       Fatigue    Overall unchanged, perhaps some improvement.  Still concerned with subclinical hypothyroidism.  Redrawing thyroid labs and will ensure that endocrinology receives as I would like their advice in regard to patient symptoms and if she should start therapy.           I discussed the assessment and  treatment plan with the patient. The patient was provided an opportunity to ask questions and all were answered. The patient agreed with the plan and demonstrated an understanding of the instructions.   The patient was advised to call back or seek an in-person evaluation if the symptoms worsen or if the condition fails to improve as anticipated.   MMable Paris FNP

## 2019-02-05 NOTE — Assessment & Plan Note (Signed)
Overall unchanged, perhaps some improvement.  Still concerned with subclinical hypothyroidism.  Redrawing thyroid labs and will ensure that endocrinology receives as I would like their advice in regard to patient symptoms and if she should start therapy.

## 2019-02-05 NOTE — Patient Instructions (Addendum)
Fasting labs.   I want to ensure we look at your cholesterol, A1c, and liver enzymes as well as thyroid.  Based on the results, I will also fax them over to endocrinology.   Nice to see you today!

## 2019-02-05 NOTE — Assessment & Plan Note (Signed)
On increased dose of Crestor 10 mg ( from 5mg ). pending labs.

## 2019-02-06 ENCOUNTER — Other Ambulatory Visit: Payer: Self-pay

## 2019-02-06 ENCOUNTER — Other Ambulatory Visit (INDEPENDENT_AMBULATORY_CARE_PROVIDER_SITE_OTHER): Payer: 59

## 2019-02-06 DIAGNOSIS — E1165 Type 2 diabetes mellitus with hyperglycemia: Secondary | ICD-10-CM

## 2019-02-06 DIAGNOSIS — E1140 Type 2 diabetes mellitus with diabetic neuropathy, unspecified: Secondary | ICD-10-CM | POA: Diagnosis not present

## 2019-02-06 DIAGNOSIS — IMO0002 Reserved for concepts with insufficient information to code with codable children: Secondary | ICD-10-CM

## 2019-02-07 LAB — COMPREHENSIVE METABOLIC PANEL
ALT: 26 IU/L (ref 0–32)
AST: 18 IU/L (ref 0–40)
Albumin/Globulin Ratio: 1.9 (ref 1.2–2.2)
Albumin: 4.5 g/dL (ref 3.8–4.8)
Alkaline Phosphatase: 72 IU/L (ref 39–117)
BUN/Creatinine Ratio: 27 (ref 12–28)
BUN: 16 mg/dL (ref 8–27)
Bilirubin Total: 0.5 mg/dL (ref 0.0–1.2)
CO2: 23 mmol/L (ref 20–29)
Calcium: 9.4 mg/dL (ref 8.7–10.3)
Chloride: 107 mmol/L — ABNORMAL HIGH (ref 96–106)
Creatinine, Ser: 0.6 mg/dL (ref 0.57–1.00)
GFR calc Af Amer: 113 mL/min/{1.73_m2} (ref >59)
GFR calc non Af Amer: 98 mL/min/{1.73_m2} (ref >59)
Globulin, Total: 2.4 g/dL (ref 1.5–4.5)
Glucose: 137 mg/dL — ABNORMAL HIGH (ref 65–99)
Potassium: 4.8 mmol/L (ref 3.5–5.2)
Sodium: 145 mmol/L — ABNORMAL HIGH (ref 134–144)
Total Protein: 6.9 g/dL (ref 6.0–8.5)

## 2019-02-07 LAB — HEMOGLOBIN A1C
Est. average glucose Bld gHb Est-mCnc: 206 mg/dL
Hgb A1c MFr Bld: 8.8 % — ABNORMAL HIGH (ref 4.8–5.6)

## 2019-02-07 LAB — LIPID PANEL
Chol/HDL Ratio: 3.4 ratio (ref 0.0–4.4)
Cholesterol, Total: 151 mg/dL (ref 100–199)
HDL: 45 mg/dL (ref >39)
LDL Calculated: 80 mg/dL (ref 0–99)
Triglycerides: 129 mg/dL (ref 0–149)
VLDL Cholesterol Cal: 26 mg/dL (ref 5–40)

## 2019-02-07 LAB — TSH: TSH: 0.132 u[IU]/mL — ABNORMAL LOW (ref 0.450–4.500)

## 2019-02-07 LAB — T4, FREE: Free T4: 1.13 ng/dL (ref 0.82–1.77)

## 2019-02-07 LAB — T3, FREE: T3, Free: 2.5 pg/mL (ref 2.0–4.4)

## 2019-02-09 ENCOUNTER — Other Ambulatory Visit: Payer: Self-pay | Admitting: Family

## 2019-02-09 DIAGNOSIS — M19041 Primary osteoarthritis, right hand: Secondary | ICD-10-CM

## 2019-02-12 ENCOUNTER — Telehealth: Payer: Self-pay | Admitting: Family

## 2019-02-12 ENCOUNTER — Other Ambulatory Visit: Payer: Self-pay | Admitting: Family

## 2019-02-12 DIAGNOSIS — E119 Type 2 diabetes mellitus without complications: Secondary | ICD-10-CM

## 2019-02-12 MED ORDER — ROSUVASTATIN CALCIUM 20 MG PO TABS: 20.0000 mg | ORAL_TABLET | Freq: Every day | ORAL | 1 refills | Status: DC

## 2019-02-12 NOTE — Telephone Encounter (Signed)
Please call patient's endocrinology, Jodi Geralds nurse practitioner.    I have been  following patient's TSH which is been low.  T3 and T4 have been normal.     I would like to fax this over and have Skeet Simmer look at this since pt has established with her.     A1c is 8.8 ( from 9.4 % 2 months ago)  Please ask Skeet Simmer to either confirm she can see labs via Epi or we can fax to her.

## 2019-02-12 NOTE — Telephone Encounter (Signed)
I have called [facility] & spoke to receptionist there. She asked just incase for me to also fax labs.

## 2019-02-19 NOTE — Telephone Encounter (Signed)
Call endo again Did hilary review pt's TSH? I am concerned as has been low.

## 2019-02-19 NOTE — Telephone Encounter (Signed)
I spoke with Brittany [name]'s nurse & she is speaking with her in regards to patient's TSH. She stated that she would also call us back to let us know what Skeet Simmer advised.

## 2019-02-21 ENCOUNTER — Telehealth: Payer: Self-pay

## 2019-02-21 NOTE — Telephone Encounter (Signed)
Please draft letter to patient as voicemail is full. Ms Brittany [name], Brittany [name] you are well.  Just as you aware, we were able to reach your endocrinologist's office, Starla Link, adn she stated that she will review your thyroid studies with you at your next appointment Mar 09, 2019.   Take care,   Claris Che

## 2019-02-21 NOTE — Telephone Encounter (Signed)
Letter printed and sent.  

## 2019-02-22 ENCOUNTER — Other Ambulatory Visit: Payer: Self-pay | Admitting: Family

## 2019-03-28 ENCOUNTER — Other Ambulatory Visit: Payer: Self-pay | Admitting: Family

## 2019-03-28 DIAGNOSIS — K2100 Gastro-esophageal reflux disease with esophagitis, without bleeding: Secondary | ICD-10-CM

## 2019-05-07 ENCOUNTER — Encounter: Payer: Self-pay | Admitting: Family

## 2019-05-07 ENCOUNTER — Ambulatory Visit (INDEPENDENT_AMBULATORY_CARE_PROVIDER_SITE_OTHER): Payer: 59 | Admitting: Family

## 2019-05-07 ENCOUNTER — Other Ambulatory Visit: Payer: Self-pay

## 2019-05-07 DIAGNOSIS — I10 Essential (primary) hypertension: Secondary | ICD-10-CM

## 2019-05-07 DIAGNOSIS — R7989 Other specified abnormal findings of blood chemistry: Secondary | ICD-10-CM

## 2019-05-07 DIAGNOSIS — E1140 Type 2 diabetes mellitus with diabetic neuropathy, unspecified: Secondary | ICD-10-CM

## 2019-05-07 DIAGNOSIS — E119 Type 2 diabetes mellitus without complications: Secondary | ICD-10-CM | POA: Diagnosis not present

## 2019-05-07 DIAGNOSIS — E785 Hyperlipidemia, unspecified: Secondary | ICD-10-CM | POA: Diagnosis not present

## 2019-05-07 DIAGNOSIS — E1165 Type 2 diabetes mellitus with hyperglycemia: Secondary | ICD-10-CM

## 2019-05-07 DIAGNOSIS — IMO0002 Reserved for concepts with insufficient information to code with codable children: Secondary | ICD-10-CM

## 2019-05-07 MED ORDER — EZETIMIBE 10 MG PO TABS: 10.0000 mg | ORAL_TABLET | Freq: Every day | ORAL | 3 refills | Status: DC

## 2019-05-07 MED ORDER — GLIMEPIRIDE 4 MG PO TABS: 4.0000 mg | ORAL_TABLET | Freq: Every day | ORAL | 1 refills | Status: DC

## 2019-05-07 NOTE — Assessment & Plan Note (Signed)
BP Readings from Last 3 Encounters:  12/02/18 122/76  09/02/18 131/85  08/05/18 118/82   Continue regimen for now. She monitor at [employer]( her work) as well

## 2019-05-07 NOTE — Assessment & Plan Note (Signed)
Concern for hyperthyroidism. Never addressed with endocrine. Doesn't appear grossly symptomatic. Pending US thyroid and referral back to [facility] so that they can address. Close follow up advised with patient.

## 2019-05-07 NOTE — Progress Notes (Signed)
This visit type was conducted due to national recommendations for restrictions regarding the COVID-19 pandemic (e.g. social distancing).  This format is felt to be most appropriate for this patient at this time.  All issues noted in this document were discussed and addressed.  No physical exam was performed (except for noted visual exam findings with Video Visits). Virtual Visit via Video Note  I connected with@  on 05/07/19 at  8:30 AM EDT by a video enabled telemedicine application and verified that I am speaking with the correct person using two identifiers.  Location patient: home Location provider:work Persons participating in the virtual visit: patient, provider  I discussed the limitations of evaluation and management by telemedicine and the availability of in person appointments. The patient expressed understanding and agreed to proceed.  Interactive audio and video telecommunications were attempted between this provider and patient, however failed, due to patient having technical difficulties or patient did not have access to video capability.  We continued and completed visit with audio only.    HPI:  Feels well , no new complaints.  Fatigue is unchanged. Has been cleaning out storage locations this week.   HTN- compliant with medication. Denies exertional chest pain or pressure, numbness or tingling radiating to left arm or jaw, palpitations, dizziness, frequent headaches, changes in vision, or shortness of breath.   DM- Not checking blood sugar as glucometer broken. on amaryl 28m ( ran out of the 433m. On trulicity, farziga, metformin XR ( checked with pharmacist and NOT recalled), glimepiride; last a1c 8.8  Had established with endocrine, however not going back as too expensive. Has [employer] now.   Low TSH- No palpitations, increased anxiety, trouble swallowing. Doesn't feel neck in enlarged.   Chronic right leg pain- using Mobic daily and also Cymbalta.   Depression - feels  well on cymbalta  HLD- increased crestor to 2091mHowever feels bl leg cramps with 52m32mmproved on 10mg28mas been staying on 10mg.43mOS: See pertinent positives and negatives per HPI.  Past Medical History:  Diagnosis Date  . Diabetes mellitus without complication (HCC)  [place]   . Hypertension    pt states on medication as prevenative  . Obesity     Past Surgical History:  Procedure Laterality Date  . ABDOMINAL HYSTERECTOMY    . COLONOSCOPY WITH PROPOFOL N/A 06/25/2015   Procedure: COLONOSCOPY WITH PROPOFOL;  Surgeon: DarrenLucilla Lame [place] [place];  Laterality: N/A;  Diabetic - oral meds  . ELBOW SURGERY Left   . HIP SURGERY Right    twice    Family History  Problem Relation Age of Onset  . Diabetes Mother   . Cancer Father        unknown; '90 % cancer'  . Diabetes Sister   . Diabetes Brother   . Breast cancer Cousin        unknown age at dx.  . Thyroid cancer Neg Hx     SOCIAL HX: never smoker   Current Outpatient Medications:  .  metFORMIN (GLUCOPHAGE-XR) 500 MG 24 hr tablet, Take 1,000 mg by mouth daily with breakfast., Disp: , Rfl:  .  blood glucose meter kit and supplies, Dispense based on patient and insurance preference. Use up to four times daily as directed. (FOR ICD-10 E10.9, E11.9)., Disp: 1 each, Rfl: 0 .  DULoxetine (CYMBALTA) 60 MG capsule, TAKE 1 CAPSULE BY MOUTH EVERY DAY, Disp: 90 capsule, Rfl: 1 .  ezetimibe (  ZETIA) 10 MG tablet, Take 1 tablet (10 mg total) by mouth daily., Disp: 90 tablet, Rfl: 3 .  FARXIGA 5 MG TABS tablet, Take 5 mg by mouth daily., Disp: , Rfl:  .  glimepiride (AMARYL) 4 MG tablet, Take 1 tablet (4 mg total) by mouth daily with breakfast., Disp: 90 tablet, Rfl: 1 .  glucose blood (ONE TOUCH ULTRA TEST) test strip, USE TO CHECK BLOOD GLUCOSE TWICE A DAY, Disp: 100 each, Rfl: 1 .  meloxicam (MOBIC) 15 MG tablet, TAKE 1 TABLET BY MOUTH EVERY DAY. *OTHER MANUF. IS DISCONTINUED*,  Disp: 90 tablet, Rfl: 0 .  omeprazole (PRILOSEC) 40 MG capsule, TAKE 1 CAPSULE (40 MG TOTAL) BY MOUTH DAILY AS NEEDED., Disp: 90 capsule, Rfl: 1 .  rosuvastatin (CRESTOR) 20 MG tablet, Take 1 tablet (20 mg total) by mouth daily. Dose decrease. (Patient taking differently: Take 10 mg by mouth daily. Dose decrease.), Disp: 90 tablet, Rfl: 1 .  TRULICITY 1.5 SK/8.7GO SOPN, INJECT 1.5 MG AS DIRECTED ONCE A WEEK, Disp: 12 pen, Rfl: 0      ASSESSMENT AND PLAN:  Discussed the following assessment and plan:  Problem List Items Addressed This Visit      Cardiovascular and Mediastinum   Essential hypertension    BP Readings from Last 3 Encounters:  12/02/18 122/76  09/02/18 131/85  08/05/18 118/82   Continue regimen for now. She monitor at [facility]( her work) as well      Relevant Medications   ezetimibe (ZETIA) 10 MG tablet     Endocrine   Type 2 diabetes, uncontrolled, with neuropathy ([place])    Suspect improved. No longer will follow with endocrine for this. Pending a1c.      Relevant Medications   metFORMIN (GLUCOPHAGE-XR) 500 MG 24 hr tablet   glimepiride (AMARYL) 4 MG tablet     Other   Hyperlipidemia    Unable to tolerate crestor 64m; she will maintain on 169mand we will adjunct with zetia      Relevant Medications   ezetimibe (ZETIA) 10 MG tablet   Other Relevant Orders   Lipid panel   Elevated TSH    Concern for hyperthyroidism. Never addressed with endocrine. Doesn't appear grossly symptomatic. Pending [place] and [facility] so that [facility] can [facility]. Close follow up advised with patient.         Relevant Orders   [place]ROID   Ambulatory referral to Endocrinology    Other Visit Diagnoses    Type 2 diabetes mellitus without complication, without long-term current use of insulin (HCC)    -  Primary   Relevant Medications   metFORMIN (GLUCOPHAGE-XR) 500 MG 24 hr tablet   glimepiride (AMARYL) 4 MG tablet   Other Relevant Orders   POCT  Glucose (Device for Home Use)   Hemoglobin A1c   Comprehensive metabolic panel         I discussed the assessment and treatment plan with the patient. The patient was provided an opportunity to ask questions and all were answered. The patient agreed with the plan and demonstrated an understanding of the instructions.   The patient was advised to call back or seek an in-person evaluation if the symptoms worsen or if the condition fails to improve as anticipated.   MaMable ParisFNP   I spent 25 min non face to face w/ pt.

## 2019-05-07 NOTE — Assessment & Plan Note (Signed)
Suspect improved. No longer will follow with endocrine for this. Pending a1c.

## 2019-05-07 NOTE — Assessment & Plan Note (Signed)
Unable to tolerate crestor 20mg ; she will maintain on 10mg  and we will adjunct with zetia

## 2019-05-07 NOTE — Patient Instructions (Signed)
Stay on current regimen for diabetes for now.   Start zetia for cholesterol and stay on crestor 10mg  ; monitor for joint aches  Fasting labs  MOST IMPORTANT: Today we discussed referrals, orders. ENDOCRINE for abnormal thyroid and concern for hyperthyroidism;  Ultrasound of thyroid   I have placed these orders in the system for you.  Please be sure to give [place] a call if you have not heard from our office regarding this. We should hear from [place] within ONE week with information regarding your appointment. If not, please let me know immediately.    Stay safe!

## 2019-05-09 ENCOUNTER — Other Ambulatory Visit (INDEPENDENT_AMBULATORY_CARE_PROVIDER_SITE_OTHER): Payer: 59

## 2019-05-09 ENCOUNTER — Other Ambulatory Visit: Payer: Self-pay | Admitting: Family

## 2019-05-09 ENCOUNTER — Other Ambulatory Visit: Payer: Self-pay

## 2019-05-09 DIAGNOSIS — M19041 Primary osteoarthritis, right hand: Secondary | ICD-10-CM

## 2019-05-09 DIAGNOSIS — E119 Type 2 diabetes mellitus without complications: Secondary | ICD-10-CM

## 2019-05-09 DIAGNOSIS — E785 Hyperlipidemia, unspecified: Secondary | ICD-10-CM | POA: Diagnosis not present

## 2019-05-09 LAB — HEMOGLOBIN A1C: Hgb A1c MFr Bld: 8.1 % — ABNORMAL HIGH (ref 4.6–6.5)

## 2019-05-09 LAB — COMPREHENSIVE METABOLIC PANEL
ALT: 18 U/L (ref 0–35)
AST: 14 U/L (ref 0–37)
Albumin: 4.5 g/dL (ref 3.5–5.2)
Alkaline Phosphatase: 57 U/L (ref 39–117)
BUN: 27 mg/dL — ABNORMAL HIGH (ref 6–23)
CO2: 28 mEq/L (ref 19–32)
Calcium: 9.2 mg/dL (ref 8.4–10.5)
Chloride: 105 mEq/L (ref 96–112)
Creatinine, Ser: 0.65 mg/dL (ref 0.40–1.20)
GFR: 92.27 mL/min (ref >60.00)
Glucose, Bld: 154 mg/dL — ABNORMAL HIGH (ref 70–99)
Potassium: 4.3 mEq/L (ref 3.5–5.1)
Sodium: 141 mEq/L (ref 135–145)
Total Bilirubin: 0.8 mg/dL (ref 0.2–1.2)
Total Protein: 7.2 g/dL (ref 6.0–8.3)

## 2019-05-09 LAB — LIPID PANEL
Cholesterol: 149 mg/dL (ref 0–200)
HDL: 48.1 mg/dL (ref >39.00)
LDL Cholesterol: 79 mg/dL (ref 0–99)
NonHDL: 100.52
Total CHOL/HDL Ratio: 3
Triglycerides: 109 mg/dL (ref 0.0–149.0)
VLDL: 21.8 mg/dL (ref 0.0–40.0)

## 2019-05-12 ENCOUNTER — Telehealth: Payer: Self-pay | Admitting: Family

## 2019-05-12 MED ORDER — METFORMIN HCL 1000 MG PO TABS: 1000.0000 mg | ORAL_TABLET | Freq: Two times a day (BID) | ORAL | 3 refills | Status: DC

## 2019-05-13 ENCOUNTER — Ambulatory Visit: Payer: 59 | Attending: Family

## 2019-05-13 ENCOUNTER — Telehealth: Payer: Self-pay

## 2019-05-13 MED ORDER — ACCU-CHEK AVIVA PLUS W/DEVICE KIT: PACK | 0 refills | Status: DC

## 2019-05-14 ENCOUNTER — Other Ambulatory Visit: Payer: Self-pay | Admitting: Family

## 2019-05-14 DIAGNOSIS — E785 Hyperlipidemia, unspecified: Secondary | ICD-10-CM

## 2019-05-14 MED ORDER — OMEGA-3-ACID ETHYL ESTERS 1 G PO CAPS: 2.0000 g | ORAL_CAPSULE | Freq: Two times a day (BID) | ORAL | 11 refills | Status: DC

## 2019-05-15 ENCOUNTER — Other Ambulatory Visit: Payer: Self-pay

## 2019-05-15 MED ORDER — BLOOD GLUCOSE METER KIT: PACK | 0 refills | Status: DC

## 2019-05-15 NOTE — Telephone Encounter (Signed)
Pharmacy was sent an Rx for All Administrations of Blood Glucose Monitoring Suppl (ACCU-CHEK AVIVA PLUS) w/Device KIT  But the Pt's insurance will not cover it. Pharmacy advised to resend a Rx for ACCU CHEK GUIDE TEST STRIPS AND MACHINE/MONITOR and ACCU CHEK FAST CLICK LANSITS/ please advise   Pt is leaving to go out of town before noon today and needs asap

## 2019-05-15 NOTE — Telephone Encounter (Signed)
Glucometer kit printed. [facility] to [facility]

## 2019-05-15 NOTE — Telephone Encounter (Signed)
Ok to change her glucometer.  See note

## 2019-05-15 NOTE — Telephone Encounter (Signed)
Faxed. Pt aware

## 2019-05-19 NOTE — Telephone Encounter (Signed)
We received a fax from [place] that Lovaza was denied. She is on Crestor 20mg  but I do not see why this could not be increased.

## 2019-05-21 ENCOUNTER — Other Ambulatory Visit: Payer: Self-pay

## 2019-05-21 MED ORDER — ACCU-CHEK GUIDE VI STRP: ORAL_STRIP | 12 refills | Status: DC

## 2019-05-21 MED ORDER — ACCU-CHEK GUIDE W/DEVICE KIT: 1.0000 | PACK | Freq: Every day | 0 refills | Status: DC | PRN

## 2019-05-21 NOTE — Telephone Encounter (Signed)
Will you please try to resend her meter and supplies?

## 2019-05-21 NOTE — Telephone Encounter (Signed)
Pt calling again as the new meter she needs is not at the pharmacy Please resend a Rx for   [place] , TEST STRIPS AND and ACCU CHEK FAST CLICK lancets  [id number] [place], [place] - [address] [phone] (Phone) [phone] (Fax)   Med list does not reflect new machine

## 2019-05-21 NOTE — Telephone Encounter (Signed)
I have changed & sent to patient's pharmacy.

## 2019-05-22 ENCOUNTER — Ambulatory Visit
Admission: RE | Admit: 2019-05-22 | Discharge: 2019-05-22 | Disposition: A | Discharge status: Home / Self Care | Payer: 59 | Source: Ambulatory Visit | Attending: Family | Admitting: Family

## 2019-05-22 ENCOUNTER — Other Ambulatory Visit: Payer: Self-pay

## 2019-05-22 DIAGNOSIS — R7989 Other specified abnormal findings of blood chemistry: Secondary | ICD-10-CM | POA: Insufficient documentation

## 2019-05-23 ENCOUNTER — Telehealth: Payer: Self-pay | Admitting: Family

## 2019-05-23 NOTE — Telephone Encounter (Signed)
Tried to call patient, but no VM set up to leave message. Once I have spoken to patient I will call [facility] office to make appointment.

## 2019-05-23 NOTE — Telephone Encounter (Signed)
Call pt Let her know lovaza declined.  Confirm that she does not want to increase the crestor to 40mg ?  She is already on zetia.   Would she be willing to speak with [facility] pharmacist about her cholesterol? She is very helpful in situations such as these.   We can also trial Lipitor to see if more potent instead of crestor   Let me know.

## 2019-05-23 NOTE — Telephone Encounter (Signed)
Patient is calling back to speak to Judson Roch. Patient is asking if Judson Roch can call back before 2pm. [phone]

## 2019-05-23 NOTE — Telephone Encounter (Signed)
Call endocrine   Malissa Hippo, NP   [phone]  Please ask to speak with nurse and inform NP that patient has low TSH and I have concern for hyperthyroidism.   She also has thyroid nodule that meets criteria for biopsy.   Since she is established, practice rejected referral. She need ASAP appointment.   Would Deidre Ala like [place] to fax ultrasound of thyroid or is she able to see report?

## 2019-05-26 ENCOUNTER — Other Ambulatory Visit: Payer: Self-pay | Admitting: Family

## 2019-05-26 NOTE — Telephone Encounter (Signed)
See result note.  

## 2019-05-26 NOTE — Telephone Encounter (Signed)
Patient informed of Lovazo not getting approved. She stated that she has only been taking the 10 mg of Crestor & not the 20 mg. I stated that she could take two 10 mg capsules to take up the lower dose. She did not want to increase at this time. She stated that she had already spoken to Catie previously.

## 2019-05-28 ENCOUNTER — Other Ambulatory Visit: Payer: Self-pay

## 2019-05-28 MED ORDER — ROSUVASTATIN CALCIUM 10 MG PO TABS: 10.0000 mg | ORAL_TABLET | Freq: Every day | ORAL | 3 refills | Status: DC

## 2019-05-28 NOTE — Telephone Encounter (Signed)
noted 

## 2019-05-28 NOTE — Telephone Encounter (Signed)
Please update med list to reflect 10mg  crestor.  Did you get a chance to advise that I would like her to follow with endocrine for blood sugar?  Or she may speak with Catie again however I do not have another option at this time and am concerned as she is not controlled at this time.

## 2019-07-17 ENCOUNTER — Other Ambulatory Visit: Payer: Self-pay | Admitting: Family

## 2019-07-17 DIAGNOSIS — IMO0002 Reserved for concepts with insufficient information to code with codable children: Secondary | ICD-10-CM

## 2019-07-17 DIAGNOSIS — E1140 Type 2 diabetes mellitus with diabetic neuropathy, unspecified: Secondary | ICD-10-CM

## 2019-07-18 NOTE — Telephone Encounter (Signed)
rx sent in for cymbalta.  Needs f/u appt scheduled with Joycelyn Schmid in 3 months.  Please schedule.

## 2019-07-29 ENCOUNTER — Other Ambulatory Visit: Payer: Self-pay | Admitting: Family

## 2019-07-30 ENCOUNTER — Other Ambulatory Visit: Payer: Self-pay | Admitting: Surgery

## 2019-07-30 DIAGNOSIS — E041 Nontoxic single thyroid nodule: Secondary | ICD-10-CM

## 2019-08-05 ENCOUNTER — Other Ambulatory Visit: Payer: Self-pay | Admitting: Family

## 2019-08-05 DIAGNOSIS — E119 Type 2 diabetes mellitus without complications: Secondary | ICD-10-CM

## 2019-08-12 ENCOUNTER — Encounter
Admission: RE | Admit: 2019-08-12 | Discharge: 2019-08-12 | Disposition: A | Discharge status: Home / Self Care | Payer: 59 | Source: Ambulatory Visit | Attending: Surgery | Admitting: Surgery

## 2019-08-12 ENCOUNTER — Other Ambulatory Visit: Payer: Self-pay

## 2019-08-12 DIAGNOSIS — E041 Nontoxic single thyroid nodule: Secondary | ICD-10-CM | POA: Insufficient documentation

## 2019-08-12 MED ORDER — SODIUM IODIDE I-123 7.4 MBQ CAPS
400.0000 | ORAL_CAPSULE | Freq: Once | ORAL | Status: AC
  Administered 2019-08-12: 466.4 via ORAL

## 2019-08-13 ENCOUNTER — Encounter
Admission: RE | Admit: 2019-08-13 | Discharge: 2019-08-13 | Disposition: A | Discharge status: Home / Self Care | Payer: 59 | Source: Ambulatory Visit | Attending: Surgery | Admitting: Surgery

## 2019-10-06 LAB — HM DIABETES EYE EXAM

## 2019-10-07 ENCOUNTER — Other Ambulatory Visit: Payer: Self-pay | Admitting: Family

## 2019-10-07 DIAGNOSIS — K2100 Gastro-esophageal reflux disease with esophagitis, without bleeding: Secondary | ICD-10-CM

## 2019-10-13 ENCOUNTER — Other Ambulatory Visit: Payer: Self-pay | Admitting: Internal Medicine

## 2019-10-13 DIAGNOSIS — IMO0002 Reserved for concepts with insufficient information to code with codable children: Secondary | ICD-10-CM

## 2019-10-13 DIAGNOSIS — E1140 Type 2 diabetes mellitus with diabetic neuropathy, unspecified: Secondary | ICD-10-CM

## 2019-10-27 ENCOUNTER — Other Ambulatory Visit: Payer: Self-pay | Admitting: Family

## 2019-11-08 ENCOUNTER — Other Ambulatory Visit: Payer: Self-pay | Admitting: Family

## 2019-11-08 DIAGNOSIS — M19041 Primary osteoarthritis, right hand: Secondary | ICD-10-CM

## 2019-11-10 NOTE — Telephone Encounter (Signed)
Call pt She is due for f/u for htn, dm Please make an appt and advise that I have refilled mobic for 3 months only , no further refills without a f/u

## 2019-11-11 NOTE — Telephone Encounter (Signed)
Patient scheduled for follow-up 12/08/19.

## 2019-12-08 ENCOUNTER — Ambulatory Visit: Payer: 59 | Admitting: Family

## 2019-12-24 ENCOUNTER — Other Ambulatory Visit: Payer: Self-pay

## 2019-12-24 ENCOUNTER — Ambulatory Visit (INDEPENDENT_AMBULATORY_CARE_PROVIDER_SITE_OTHER): Payer: 59 | Admitting: Family

## 2019-12-24 ENCOUNTER — Encounter: Payer: Self-pay | Admitting: Family

## 2019-12-24 VITALS — BP 104/62 | HR 87 | Temp 96.4°F | Ht 60.95 in | Wt 151.8 lb

## 2019-12-24 DIAGNOSIS — IMO0002 Reserved for concepts with insufficient information to code with codable children: Secondary | ICD-10-CM

## 2019-12-24 DIAGNOSIS — M19041 Primary osteoarthritis, right hand: Secondary | ICD-10-CM | POA: Diagnosis not present

## 2019-12-24 DIAGNOSIS — M6530 Trigger finger, unspecified finger: Secondary | ICD-10-CM

## 2019-12-24 DIAGNOSIS — R252 Cramp and spasm: Secondary | ICD-10-CM | POA: Insufficient documentation

## 2019-12-24 DIAGNOSIS — E559 Vitamin D deficiency, unspecified: Secondary | ICD-10-CM

## 2019-12-24 DIAGNOSIS — E785 Hyperlipidemia, unspecified: Secondary | ICD-10-CM | POA: Diagnosis not present

## 2019-12-24 DIAGNOSIS — E1165 Type 2 diabetes mellitus with hyperglycemia: Secondary | ICD-10-CM

## 2019-12-24 DIAGNOSIS — M25552 Pain in left hip: Secondary | ICD-10-CM

## 2019-12-24 DIAGNOSIS — K219 Gastro-esophageal reflux disease without esophagitis: Secondary | ICD-10-CM

## 2019-12-24 DIAGNOSIS — M791 Myalgia, unspecified site: Secondary | ICD-10-CM

## 2019-12-24 DIAGNOSIS — E1140 Type 2 diabetes mellitus with diabetic neuropathy, unspecified: Secondary | ICD-10-CM

## 2019-12-24 DIAGNOSIS — Z1231 Encounter for screening mammogram for malignant neoplasm of breast: Secondary | ICD-10-CM

## 2019-12-24 MED ORDER — BACLOFEN 10 MG PO TABS: 10.0000 mg | ORAL_TABLET | Freq: Every evening | ORAL | 1 refills | Status: DC | PRN

## 2019-12-24 MED ORDER — MELOXICAM 7.5 MG PO TABS: 7.5000 mg | ORAL_TABLET | Freq: Every day | ORAL | 1 refills | Status: DC | PRN

## 2019-12-24 NOTE — Assessment & Plan Note (Signed)
Symptomatically controlled.  Continue Prilosec at this time

## 2019-12-24 NOTE — Assessment & Plan Note (Signed)
Stable.  Will confirm with patient what Crestor dose she is on.  Pending lipid panel at this time

## 2019-12-24 NOTE — Assessment & Plan Note (Signed)
Chronic.  Discussed referral to sports medicine.  Patient's been scheduled with Dr. Patsy Lager.

## 2019-12-24 NOTE — Assessment & Plan Note (Signed)
Chronic.  Discussed stretching, plenty of hydration.  Given baclofen for as needed use.  Advised her to trial stop Crestor

## 2019-12-24 NOTE — Assessment & Plan Note (Signed)
Uncontrolled, pending A1c.  Will share A1c with endocrine

## 2019-12-24 NOTE — Assessment & Plan Note (Signed)
Stable, chronic.  Advised to limit use of meloxicam and also reduce the dose to 7.5 mg.  Advised her to use topical Biofreeze, salon poss.

## 2019-12-24 NOTE — Patient Instructions (Addendum)
STOP vitamin d supplement; may take women's multivitamin.   Try again to mobic MORE AS needed.  Use over-the-counter Salonpas, Biofreeze.  For cramping, plenty of water, stretching. Baclofen as needed. You may trial stop crestor for a couple of weeks to see if this improves.   Appointment with Dr Patsy Lager for trigger finger.   Call and confirm what dose of crestor you are on.   Please call call and schedule your 3D mammogram  as discussed.   [facility]  [address]  [place], [place]  [phone]

## 2019-12-24 NOTE — Assessment & Plan Note (Signed)
Resolved at this time.  Pending vitamin D serum as well today.  Advised her to take regular women's multivitamin which includes vitamin D

## 2019-12-24 NOTE — Progress Notes (Signed)
Subjective:    Patient ID: Brittany [name], female    DOB: [birth year], 64 y.o.   MRN: [id number]  CC: Brittany [name] is a 64 y.o. female who presents today for follow up.   HPI:  follow-up today.    HLD- Unsure if she is on crestor 10 or '20mg'$ ; she will check at home. Compliant with zetia  Vitamin D - Has been taking 10,000 units D3 per day ( last result 37)   DM- Not checking blood sugar. Follows with endocrine. Feels that improved since starting actos.   GERD-feels well on Prilosec.  No breakthrough symptoms . no trouble or pain with swallowing  Right hip replacement- chronic. Unchanged. takes mobic daily  Taking cymbalta and feels well. No si/hi  Trigger finger left 4th finger which is that been there for some time.  It is bothersome.  She does a lot of typing at work. No numbness in the left hand, she does have occasional numbness in the right first second and third fingers.  Cramping bilateral calves for several months. Occurs at night.  She does not feel like her legs are moving.  The cramping started prior to starting Crestor. Doesn't like tonic water.  Her son is on baclofen for this which works well for him     Due MM  Seen by endocrine, Brittany [name].September 12, 2019.  He added Actos 15 mg daily.  Continues to follow with thyroid nodule.  Discussed iodine therapy versus methimazole  HISTORY:  Past Medical History:  Diagnosis Date  . Diabetes mellitus without complication ([place])   . Hyperlipidemia   . Hypertension    pt states on medication as prevenative  . Obesity    Past Surgical History:  Procedure Laterality Date  . ABDOMINAL HYSTERECTOMY    . COLONOSCOPY WITH PROPOFOL N/A 06/25/2015   Procedure: COLONOSCOPY WITH PROPOFOL;  Surgeon: Lucilla Lame, MD;  Location: [place];  Service: Endoscopy;  Laterality: N/A;  Diabetic - oral meds  . ELBOW SURGERY Left   . HIP SURGERY Right    twice   Family History  Problem Relation Age of Onset  .  Diabetes Mother   . Cancer Father        unknown; '90 % cancer'  . Diabetes Sister   . Diabetes Brother   . Breast cancer Cousin        unknown age at dx.  . Thyroid cancer Neg Hx     Allergies: Jardiance [empagliflozin], Atorvastatin, and Victoza [liraglutide] Current Outpatient Medications on File Prior to Visit  Medication Sig Dispense Refill  . Ascorbic Acid (VITAMIN C) 1000 MG tablet Take 1,000 mg by mouth daily.    . blood glucose meter kit and supplies Dispense based on patient and insurance preference. Use to check blood sugars daily (FOR ICD-10 E10.9, E11.9). 1 each 0  . Blood Glucose Monitoring Suppl (ACCU-CHEK GUIDE) w/Device KIT 1 Device by Does not apply route daily as needed. 1 kit 0  . Cholecalciferol (VITAMIN D3) 50 MCG (2000 UT) capsule Take 2,000 Units by mouth 5 (five) times daily.    . DULoxetine (CYMBALTA) 60 MG capsule TAKE 1 CAPSULE BY MOUTH EVERY DAY 90 capsule 0  . ELDERBERRY PO Take 1 tablet by mouth daily.    Marland Kitchen ezetimibe (ZETIA) 10 MG tablet Take 1 tablet (10 mg total) by mouth daily. 90 tablet 3  . FARXIGA 5 MG TABS tablet Take 5 mg by mouth daily.    Marland Kitchen glimepiride (AMARYL)  4 MG tablet Take 1 tablet (4 mg total) by mouth daily with breakfast. 90 tablet 1  . glucose blood (ACCU-CHEK GUIDE) test strip Use as instructed 100 each 12  . omega-3 acid ethyl esters (LOVAZA) 1 g capsule Take 2 capsules (2 g total) by mouth 2 (two) times daily. 120 capsule 11  . omeprazole (PRILOSEC) 40 MG capsule TAKE 1 CAPSULE (40 MG TOTAL) BY MOUTH DAILY AS NEEDED. 90 capsule 1  . rosuvastatin (CRESTOR) 20 MG tablet TAKE 1 TABLET (20 MG TOTAL) BY MOUTH DAILY. DOSE DECREASE. 90 tablet 1  . TRULICITY 1.5 WE/3.1VQ SOPN INJECT 1.5 MG AS DIRECTED ONCE A WEEK 6 pen 1   No current facility-administered medications on file prior to visit.    Social History   Tobacco Use  . Smoking status: Never Smoker  . Smokeless tobacco: Never Used  Substance Use Topics  . Alcohol use: Yes     Alcohol/week: 0.0 standard drinks    Comment: 1 times a year-wine  . Drug use: No    Review of Systems  Constitutional: Negative for chills and fever.  Respiratory: Negative for cough.   Cardiovascular: Negative for chest pain and palpitations.  Gastrointestinal: Negative for nausea and vomiting.  Musculoskeletal: Positive for arthralgias.  Neurological: Positive for numbness.      Objective:    BP 104/62   Pulse 87   Temp (!) 96.4 F (35.8 C) (Temporal)   Ht 5' 0.95" (1.548 m)   Wt 151 lb 12.8 oz (68.9 kg)   SpO2 99%   BMI 28.73 kg/m  BP Readings from Last 3 Encounters:  12/24/19 104/62  12/02/18 122/76  09/02/18 131/85   Wt Readings from Last 3 Encounters:  12/24/19 151 lb 12.8 oz (68.9 kg)  12/02/18 157 lb 12.8 oz (71.6 kg)  09/02/18 159 lb 12.8 oz (72.5 kg)   Body mass index is 28.73 kg/m.  Physical Exam Vitals reviewed.  Constitutional:      Appearance: She is well-developed.  Eyes:     Conjunctiva/sclera: Conjunctivae normal.  Cardiovascular:     Rate and Rhythm: Normal rate and regular rhythm.     Pulses: Normal pulses.     Heart sounds: Normal heart sounds.  Pulmonary:     Effort: Pulmonary effort is normal.     Breath sounds: Normal breath sounds. No wheezing, rhonchi or rales.  Musculoskeletal:     Left hand: Deformity present. No swelling.     Right lower leg: No edema.     Left lower leg: No edema.     Comments: Patient has to use passive ROM to move left 4th metacarpal.  No erythema, edema or increased warmth appreciated.  No rash  Skin:    General: Skin is warm and dry.  Neurological:     Mental Status: She is alert.  Psychiatric:        Speech: Speech normal.        Behavior: Behavior normal.        Thought Content: Thought content normal.        Assessment & Plan:   Problem List Items Addressed This Visit      Digestive   GERD (gastroesophageal reflux disease)    Symptomatically controlled.  Continue Prilosec at this time          Endocrine   Type 2 diabetes, uncontrolled, with neuropathy ([place])    Uncontrolled, pending A1c.  Will share A1c with endocrine      Relevant Orders  Hemoglobin A1c   Microalbumin / creatinine urine ratio   VITAMIN D 25 Hydroxy (Vit-D Deficiency, Fractures)     Musculoskeletal and Integument   Acquired trigger finger    Chronic.  Discussed referral to sports medicine.  Patient's been scheduled with Dr. Lorelei Pont.       Degenerative joint disease of hand   Relevant Medications   meloxicam (MOBIC) 7.5 MG tablet   baclofen (LIORESAL) 10 MG tablet     Other   Hyperlipidemia - Primary    Stable.  Will confirm with patient what Crestor dose she is on.  Pending lipid panel at this time      Relevant Orders   Lipid panel   Left hip pain    Stable, chronic.  Advised to limit use of meloxicam and also reduce the dose to 7.5 mg.  Advised her to use topical Biofreeze, salon poss.      Relevant Medications   meloxicam (MOBIC) 7.5 MG tablet   Leg cramping   Relevant Medications   baclofen (LIORESAL) 10 MG tablet   Other Relevant Orders   Comprehensive metabolic panel   Magnesium   Myalgia    Chronic.  Discussed stretching, plenty of hydration.  Given baclofen for as needed use.  Advised her to trial stop Crestor      Vitamin D deficiency    Resolved at this time.  Pending vitamin D serum as well today.  Advised her to take regular women's multivitamin which includes vitamin D       Other Visit Diagnoses    Encounter for screening mammogram for malignant neoplasm of breast       Relevant Orders   MM 3D SCREEN BREAST BILATERAL     Of note, patient will schedule mammogram  I have discontinued Karsten Fells. Lindblad's meloxicam. I am also having her start on meloxicam and baclofen. Additionally, I am having her maintain her Farxiga, glimepiride, ezetimibe, omega-3 acid ethyl esters, blood glucose meter kit and supplies, Accu-Chek Guide, Accu-Chek Guide, rosuvastatin, omeprazole,  DULoxetine, Trulicity, vitamin C, Vitamin D3, and ELDERBERRY PO.   Meds ordered this encounter  Medications  . meloxicam (MOBIC) 7.5 MG tablet    Sig: Take 1 tablet (7.5 mg total) by mouth daily as needed for pain.    Dispense:  90 tablet    Refill:  1    Order Specific Question:   Supervising Provider    Answer:   Deborra Medina L [[id number]]  . baclofen (LIORESAL) 10 MG tablet    Sig: Take 1 tablet (10 mg total) by mouth at bedtime as needed for muscle spasms (leg cramps).    Dispense:  30 each    Refill:  1    Order Specific Question:   Supervising Provider    Answer:   Crecencio Mc [[id number]]    Return precautions given.   Risks, benefits, and alternatives of the medications and treatment plan prescribed today were discussed, and patient expressed understanding.   Education regarding symptom management and diagnosis given to patient on AVS.  Continue to follow with Burnard Hawthorne, FNP for routine health maintenance.   Linard Millers and I agreed with plan.   Mable Paris, FNP

## 2019-12-25 ENCOUNTER — Other Ambulatory Visit: Payer: Self-pay | Admitting: Family

## 2019-12-25 DIAGNOSIS — E119 Type 2 diabetes mellitus without complications: Secondary | ICD-10-CM

## 2019-12-25 LAB — HEMOGLOBIN A1C
Est. average glucose Bld gHb Est-mCnc: 163 mg/dL
Hgb A1c MFr Bld: 7.3 % — ABNORMAL HIGH (ref 4.8–5.6)

## 2019-12-25 LAB — COMPREHENSIVE METABOLIC PANEL
ALT: 20 IU/L (ref 0–32)
AST: 19 IU/L (ref 0–40)
Albumin/Globulin Ratio: 2.2 (ref 1.2–2.2)
Albumin: 4.6 g/dL (ref 3.8–4.8)
Alkaline Phosphatase: 54 IU/L (ref 39–117)
BUN/Creatinine Ratio: 28 (ref 12–28)
BUN: 17 mg/dL (ref 8–27)
Bilirubin Total: 0.8 mg/dL (ref 0.0–1.2)
CO2: 20 mmol/L (ref 20–29)
Calcium: 9.1 mg/dL (ref 8.7–10.3)
Chloride: 103 mmol/L (ref 96–106)
Creatinine, Ser: 0.61 mg/dL (ref 0.57–1.00)
GFR calc Af Amer: 112 mL/min/{1.73_m2} (ref >59)
GFR calc non Af Amer: 97 mL/min/{1.73_m2} (ref >59)
Globulin, Total: 2.1 g/dL (ref 1.5–4.5)
Glucose: 94 mg/dL (ref 65–99)
Potassium: 4.2 mmol/L (ref 3.5–5.2)
Sodium: 141 mmol/L (ref 134–144)
Total Protein: 6.7 g/dL (ref 6.0–8.5)

## 2019-12-25 LAB — MICROALBUMIN / CREATININE URINE RATIO
Creatinine, Urine: 90.2 mg/dL
Microalb/Creat Ratio: 9 mg/g creat (ref 0–29)
Microalbumin, Urine: 7.8 ug/mL

## 2019-12-25 LAB — VITAMIN D 25 HYDROXY (VIT D DEFICIENCY, FRACTURES): Vit D, 25-Hydroxy: 50.2 ng/mL (ref 30.0–100.0)

## 2019-12-25 LAB — LIPID PANEL
Chol/HDL Ratio: 1.9 ratio (ref 0.0–4.4)
Cholesterol, Total: 109 mg/dL (ref 100–199)
HDL: 57 mg/dL (ref >39)
LDL Chol Calc (NIH): 39 mg/dL (ref 0–99)
Triglycerides: 56 mg/dL (ref 0–149)
VLDL Cholesterol Cal: 13 mg/dL (ref 5–40)

## 2019-12-25 LAB — MAGNESIUM: Magnesium: 1.8 mg/dL (ref 1.6–2.3)

## 2019-12-25 MED ORDER — ROSUVASTATIN CALCIUM 10 MG PO TABS: 10.0000 mg | ORAL_TABLET | Freq: Every day | ORAL | 1 refills | Status: DC

## 2019-12-25 NOTE — Progress Notes (Signed)
Patient is taking 20MG  of crestor & I have d/c 10MG  out of chart.

## 2019-12-26 ENCOUNTER — Other Ambulatory Visit: Payer: Self-pay

## 2019-12-26 MED ORDER — FARXIGA 10 MG PO TABS: 10.0000 mg | ORAL_TABLET | Freq: Every day | ORAL | 4 refills | Status: DC

## 2019-12-31 ENCOUNTER — Telehealth: Payer: Self-pay | Admitting: Family

## 2019-12-31 NOTE — Telephone Encounter (Signed)
Pt states that PCP was supposed to up her dose of dapagliflozin propanediol (FARXIGA) 10 MG TABS tablet to 20mg . Please send corrected rx

## 2020-01-02 NOTE — Telephone Encounter (Signed)
I spoke to patient & let her know to please go back to [place] 10MG  since this highest dose. Pt was agreeable to this. She does not want to be referred to a nutritionist & stated that she had been there before, but was not beneficial to her. She said that she would call endocrine to make a follow-up appointment with them.

## 2020-01-02 NOTE — Telephone Encounter (Signed)
farxiga 10mg  qd was sent 12/26/19 02/25/20, can you ensure this is resolved and patient has medication?

## 2020-01-02 NOTE — Telephone Encounter (Signed)
Patient stated that she had been taking the 10MG  all along because that is what , NP had increased to. I have no record of this, but a1c should reflect the 10MG  of Farxiga dose. Patient now has been taking two of the 10MG  tablets. Please advise on what patient should do? Not sure how high the dose of Starla Link goes. She said too that she was only seeing endocrine for her thyroid.

## 2020-01-02 NOTE — Telephone Encounter (Signed)
Call pt Brittany [name] dose of [name] is 10 mg QD taken in the morning.  Please advise patient to go back to this dose.  I would advise nutrition consult to be honest. She is so close to 6.5 % - the goal.   We can keep upping medication but diet is much better  Is she still following with endocrine? When does she see them?  Can I make ref to nutrition?

## 2020-01-04 ENCOUNTER — Other Ambulatory Visit: Payer: Self-pay | Admitting: Family

## 2020-01-04 DIAGNOSIS — IMO0002 Reserved for concepts with insufficient information to code with codable children: Secondary | ICD-10-CM

## 2020-01-04 DIAGNOSIS — E1140 Type 2 diabetes mellitus with diabetic neuropathy, unspecified: Secondary | ICD-10-CM

## 2020-01-05 ENCOUNTER — Ambulatory Visit (INDEPENDENT_AMBULATORY_CARE_PROVIDER_SITE_OTHER): Payer: 59 | Admitting: Family Medicine

## 2020-01-05 ENCOUNTER — Encounter: Payer: Self-pay | Admitting: Family Medicine

## 2020-01-05 ENCOUNTER — Other Ambulatory Visit: Payer: Self-pay

## 2020-01-05 VITALS — BP 104/60 | HR 86 | Temp 98.4°F | Ht 60.95 in | Wt 153.2 lb

## 2020-01-05 DIAGNOSIS — M6530 Trigger finger, unspecified finger: Secondary | ICD-10-CM

## 2020-01-05 MED ORDER — METHYLPREDNISOLONE ACETATE 40 MG/ML IJ SUSP
20.0000 mg | Freq: Once | INTRAMUSCULAR | Status: AC
  Administered 2020-01-05: 20 mg via INTRA_ARTICULAR

## 2020-01-05 NOTE — Progress Notes (Signed)
     [name] [name], MD Primary Care and Sports Medicine [facility] [address] [place], [address] Phone: [phone]  FAX: [phone]  Brittany [name] - 64 y.o. female  MRN [id number]  Date of Birth: [birth year]  Visit Date: 01/05/2020  PCP: Allegra Grana, FNP  Referred by: Allegra Grana, FNP  Chief Complaint  Patient presents with  . Trigger Finger    This visit occurred during the SARS-CoV-2 public health emergency.  Safety protocols were in place, including screening questions prior to the visit, additional usage of staff PPE, and extensive cleaning of exam room while observing appropriate contact time as indicated for disinfecting solutions.    Procedure only:  Using an anatomical model, I reviewed with the patent the structures involved and how they related to their diagnosis .  We discussed the pathophysiology of trigger fingers. Discussed the inflammatory nature of nodule creation and likely nodule abutting the A1 pulley system, this causing the patient's discomfort and sensations. We discussed that treatments for this include direct injection into the tendon sheath to attempt to shrink catching tissue. This can be done 1-2 times. Other treatments include surgical release. If the patient fails to trigger finger injections, I would recommend trigger finger release if the patient desires relief of the symptoms.   Tendon Sheath Injection Procedure Note LAKECIA DESCHAMPS [birth year] Date of procedure: 01/05/2020  Procedure: Tendon Sheath Injection for Trigger Finger, L Indications: Pain  Procedure Details Verbal consent was obtained. Risks (including rare risk of infection, potential risk for skin lightening and potential atrophy), benefits and alternatives were discussed. Prepped with Chloraprep and Ethyl Chloride used for anesthesia. Under sterile conditions, patient injected at palmar crease aiming distally with 45 degree  angle towards nodule; injected directly into tendon sheath. Medication flowed freely without resistance.  Needle size: 22 gauge 1 1/2 inch Injection: 1/2 cc of Lidocaine 1% and Depo-Medrol 20 mg Medication: Depo-Medrol 20 mg   Signed,  Maurio Baize T. Anavictoria Wilk, MD

## 2020-01-05 NOTE — Addendum Note (Signed)
Addended by: Damita Lack on: 01/05/2020 11:14 AM   Modules accepted: Orders

## 2020-01-07 ENCOUNTER — Ambulatory Visit: Payer: 59 | Admitting: Family Medicine

## 2020-01-16 ENCOUNTER — Other Ambulatory Visit: Payer: Self-pay | Admitting: Family

## 2020-01-16 DIAGNOSIS — R252 Cramp and spasm: Secondary | ICD-10-CM

## 2020-01-21 ENCOUNTER — Telehealth: Payer: Self-pay

## 2020-01-21 NOTE — Telephone Encounter (Signed)
Patient called and notified that both PA's were approved for her [name] as well as Trulicity.

## 2020-01-22 ENCOUNTER — Telehealth: Payer: Self-pay

## 2020-01-22 NOTE — Telephone Encounter (Signed)
PA approved for Trulicity 1.5mg /52mL 01/20/20-01/20/23.

## 2020-01-22 NOTE — Telephone Encounter (Signed)
Patient's Brittany [name] 10mg  PA was approved for 01/21/20-01/20/23.

## 2020-01-28 ENCOUNTER — Other Ambulatory Visit: Payer: Self-pay | Admitting: Family

## 2020-01-28 DIAGNOSIS — E119 Type 2 diabetes mellitus without complications: Secondary | ICD-10-CM

## 2020-02-03 ENCOUNTER — Encounter: Payer: Self-pay | Admitting: Family

## 2020-02-16 ENCOUNTER — Other Ambulatory Visit: Payer: Self-pay | Admitting: Family

## 2020-02-16 DIAGNOSIS — E119 Type 2 diabetes mellitus without complications: Secondary | ICD-10-CM

## 2020-02-21 ENCOUNTER — Other Ambulatory Visit: Payer: Self-pay | Admitting: Family

## 2020-02-21 DIAGNOSIS — R252 Cramp and spasm: Secondary | ICD-10-CM

## 2020-03-19 ENCOUNTER — Other Ambulatory Visit: Payer: Self-pay | Admitting: Family

## 2020-03-19 DIAGNOSIS — R252 Cramp and spasm: Secondary | ICD-10-CM

## 2020-03-26 ENCOUNTER — Other Ambulatory Visit: Payer: Self-pay

## 2020-03-26 ENCOUNTER — Ambulatory Visit (INDEPENDENT_AMBULATORY_CARE_PROVIDER_SITE_OTHER): Payer: No Typology Code available for payment source | Admitting: Nurse Practitioner

## 2020-03-26 ENCOUNTER — Ambulatory Visit: Payer: 59 | Admitting: Family

## 2020-03-26 ENCOUNTER — Encounter: Payer: Self-pay | Admitting: Nurse Practitioner

## 2020-03-26 VITALS — BP 120/78 | HR 91 | Temp 97.4°F | Ht 60.95 in | Wt 155.2 lb

## 2020-03-26 DIAGNOSIS — R252 Cramp and spasm: Secondary | ICD-10-CM | POA: Diagnosis not present

## 2020-03-26 DIAGNOSIS — E1165 Type 2 diabetes mellitus with hyperglycemia: Secondary | ICD-10-CM | POA: Diagnosis not present

## 2020-03-26 DIAGNOSIS — IMO0002 Reserved for concepts with insufficient information to code with codable children: Secondary | ICD-10-CM

## 2020-03-26 DIAGNOSIS — E1140 Type 2 diabetes mellitus with diabetic neuropathy, unspecified: Secondary | ICD-10-CM

## 2020-03-26 NOTE — Progress Notes (Addendum)
Established Patient Office Visit  Subjective:  Patient ID: Brittany [name], female    DOB: 03-11-1956  Age: 64 y.o. MRN: [id number]  CC:  Chief Complaint  Patient presents with  . Acute Visit    leg cramps and feet numbness   HPI Brittany [name] presents for chronic problem with bilateral calf cramping at night.  This occurs after she is asleep anywhere from midnight on.  She reports she wakes up various times during the night and this is an ongoing problem for years.  She reports she never gets a full nights sleep.  She can recall taking medications in the past that did not help: She tried tonic water and did not like the taste. She took baclofen for about 5 months and that did not help at all.  In the distant past, she tried gabapentin when Dr. Lucita Lora ordered it for her and that did not work.  She has tried magnesium supplements that did not help. She does not think she has restless leg syndrome as her legs do not twitch or move or make her feel like she needs to walk. When she gets the cramps she dorsiflexes her feet and stretches, and the cramps  can last from 20 minutes to about 40 minutes.  She has mild varicose veins.  No ankle or feet swelling. She has tried support stockings but finds that it hurts her feet and that she cuts off the circulation at her knees.  She has tried various compression strengths and types of support stockings.  She has not had a vascular evaluation.  She does not think she would fit that into her time schedule.  In addition, she has had diabetic neuropathy in her feet and they feel numb. This is baseline.  She has a corn on her right lateral foot.  She declines podiatry referral.  Patient walks on her feet at work.  Patient relates that her walking on her feet all day aggravates the symptoms.  She is requesting a repeat hemoglobin A1c today.  Reviewed her past medical, surgical, medication and social history.  Past Medical History:  Diagnosis Date  . Diabetes  mellitus without complication ([place])   . Hyperlipidemia   . Hypertension    pt states on medication as prevenative  . Obesity     Past Surgical History:  Procedure Laterality Date  . ABDOMINAL HYSTERECTOMY    . COLONOSCOPY WITH PROPOFOL N/A 06/25/2015   Procedure: COLONOSCOPY WITH PROPOFOL;  Surgeon: Lucilla Lame, MD;  Location: [place];  Service: Endoscopy;  Laterality: N/A;  Diabetic - oral meds  . ELBOW SURGERY Left   . HIP SURGERY Right    twice    Family History  Problem Relation Age of Onset  . Diabetes Mother   . Cancer Father        unknown; '90 % cancer'  . Diabetes Sister   . Diabetes Brother   . Breast cancer Cousin        unknown age at dx.  . Thyroid cancer Neg Hx     Social History   Socioeconomic History  . Marital status: Single    Spouse name: Not on file  . Number of children: Not on file  . Years of education: Not on file  . Highest education level: Not on file  Occupational History  . Not on file  Tobacco Use  . Smoking status: Never Smoker  . Smokeless tobacco: Never Used  Substance and Sexual Activity  .  Alcohol use: Yes    Alcohol/week: 0.0 standard drinks    Comment: 1 times a year-wine  . Drug use: No  . Sexual activity: Not Currently  Other Topics Concern  . Not on file  Social History Narrative    from [place] and then moved to [place].    Works at [employer].     Lives with ex husband, Carloyn Manner.    2 dogs, 15 chickens.       Enjoys Firefighter, fairs, yard Press photographer.          Exercise-          Social Determinants of Health   Financial Resource Strain:   . Difficulty of Paying Living Expenses:   Food Insecurity:   . Worried About Charity fundraiser in the Last Year:   . Arboriculturist in the Last Year:   Transportation Needs:   . Film/video editor (Medical):   Marland Kitchen Lack of Transportation (Non-Medical):   Physical Activity:   . Days of Exercise per Week:   . Minutes of Exercise per Session:   Stress:   .  Feeling of Stress :   Social Connections:   . Frequency of Communication with Friends and Family:   . Frequency of Social Gatherings with Friends and Family:   . Attends Religious Services:   . Active Member of Clubs or Organizations:   . Attends Archivist Meetings:   Marland Kitchen Marital Status:   Intimate Partner Violence:   . Fear of Current or Ex-Partner:   . Emotionally Abused:   Marland Kitchen Physically Abused:   . Sexually Abused:     Outpatient Medications Prior to Visit  Medication Sig Dispense Refill  . Ascorbic Acid (VITAMIN C) 1000 MG tablet Take 1,000 mg by mouth daily.    . blood glucose meter kit and supplies Dispense based on patient and insurance preference. Use to check blood sugars daily (FOR ICD-10 E10.9, E11.9). 1 each 0  . Blood Glucose Monitoring Suppl (ACCU-CHEK GUIDE) w/Device KIT 1 Device by Does not apply route daily as needed. 1 kit 0  . dapagliflozin propanediol (FARXIGA) 10 MG TABS tablet Take 10 mg by mouth daily before breakfast. 30 tablet 4  . DULoxetine (CYMBALTA) 60 MG capsule TAKE 1 CAPSULE BY MOUTH EVERY DAY 90 capsule 1  . ELDERBERRY PO Take 1 tablet by mouth daily.    Marland Kitchen ezetimibe (ZETIA) 10 MG tablet Take 1 tablet (10 mg total) by mouth daily. 90 tablet 3  . glimepiride (AMARYL) 4 MG tablet TAKE 1 TABLET BY MOUTH DAILY WITH BREAKFAST 90 tablet 1  . glucose blood (ACCU-CHEK GUIDE) test strip Use as instructed 100 each 12  . meloxicam (MOBIC) 7.5 MG tablet Take 1 tablet (7.5 mg total) by mouth daily as needed for pain. 90 tablet 1  . omega-3 acid ethyl esters (LOVAZA) 1 g capsule Take 2 capsules (2 g total) by mouth 2 (two) times daily. 120 capsule 11  . omeprazole (PRILOSEC) 40 MG capsule TAKE 1 CAPSULE (40 MG TOTAL) BY MOUTH DAILY AS NEEDED. 90 capsule 1  . rosuvastatin (CRESTOR) 10 MG tablet Take 1 tablet (10 mg total) by mouth daily. Dose decrease. 90 tablet 1  . TRULICITY 1.5 NI/7.7OE SOPN INJECT 1.5 MG AS DIRECTED ONCE A WEEK 6 pen 1  . baclofen  (LIORESAL) 10 MG tablet TAKE 1 TABLET (10 MG TOTAL) BY MOUTH AT BEDTIME AS NEEDED FOR MUSCLE SPASMS (LEG CRAMPS). 30 tablet 1  .  rosuvastatin (CRESTOR) 20 MG tablet TAKE 1 TABLET (20 MG TOTAL) BY MOUTH DAILY. DOSE DECREASE. 90 tablet 1   No facility-administered medications prior to visit.    Allergies  Allergen Reactions  . Jardiance [Empagliflozin] Other (See Comments)    Muscle aches, blur vision   . Atorvastatin Other (See Comments)    myalgia  . Victoza [Liraglutide] Other (See Comments)    'knot on stomach.'    Review of Systems Pertinent positives none history of present illness otherwise negative.   Objective:    Physical Exam  Constitutional: She is oriented to person, place, and time. She appears well-developed and well-nourished.  HENT:  Head: Normocephalic.  Cardiovascular: Normal rate and regular rhythm.  Pulmonary/Chest: Effort normal and breath sounds normal.  Abdominal: Soft. There is no abdominal tenderness.  Musculoskeletal:        General: Normal range of motion.  Neurological: She is alert and oriented to person, place, and time.  Skin: Skin is warm and dry.  Psychiatric: She has a normal mood and affect. Her behavior is normal.  Vitals reviewed.   BP 120/78 (BP Location: Left Arm, Patient Position: Sitting, Cuff Size: Normal)   Pulse 91   Temp (!) 97.4 F (36.3 C) (Skin)   Ht 5' 0.95" (1.548 m)   Wt 155 lb 3.2 oz (70.4 kg)   SpO2 98%   BMI 29.37 kg/m  Wt Readings from Last 3 Encounters:  03/26/20 155 lb 3.2 oz (70.4 kg)  01/05/20 153 lb 4 oz (69.5 kg)  12/24/19 151 lb 12.8 oz (68.9 kg)     Health Maintenance Due  Topic Date Due  . COVID-19 Vaccine (1) Never done  . FOOT EXAM  01/15/2019  . MAMMOGRAM  08/21/2019    There are no preventive care reminders to display for this patient.  Lab Results  Component Value Date   TSH 0.132 (L) 02/06/2019   Lab Results  Component Value Date   WBC 5.9 12/02/2018   HGB 15.8 12/02/2018   HCT  47.7 (H) 12/02/2018   MCV 90 12/02/2018   PLT 241 12/02/2018   Lab Results  Component Value Date   NA 141 12/24/2019   K 4.2 12/24/2019   CO2 20 12/24/2019   GLUCOSE 94 12/24/2019   BUN 17 12/24/2019   CREATININE 0.61 12/24/2019   BILITOT 0.8 12/24/2019   ALKPHOS 54 12/24/2019   AST 19 12/24/2019   ALT 20 12/24/2019   PROT 6.7 12/24/2019   ALBUMIN 4.6 12/24/2019   CALCIUM 9.1 12/24/2019   GFR 92.27 05/09/2019   Lab Results  Component Value Date   CHOL 109 12/24/2019   Lab Results  Component Value Date   HDL 57 12/24/2019   Lab Results  Component Value Date   LDLCALC 39 12/24/2019   Lab Results  Component Value Date   TRIG 56 12/24/2019   Lab Results  Component Value Date   CHOLHDL 1.9 12/24/2019   Lab Results  Component Value Date   HGBA1C 7.3 (H) 12/24/2019      Assessment & Plan:   Problem List Items Addressed This Visit      Endocrine   Type 2 diabetes, uncontrolled, with neuropathy ([place])   Relevant Orders   HgB A1c     Other   Leg cramping - Primary   Relevant Orders   CBC with Differential/Platelet   Iron, TIBC and Ferritin Panel     No orders of the defined types were placed in this encounter.  Patient advised:  For leg cramping: I have added blood work today looking at the CBC and iron.  Perform leg stretches in the evenings before bed.  Restrict caffeine during the entire day, take a warm bath before bed (no tub- advised shower- heat),  begin magnesium 250 mg daily -sold over the counter. No access to exercise bicycle and so advised to perform leg stretching before bed and upon waking up while you get out of bed.  She has not had sleep apnea testing and does not want that. She has not had a vascular evaluation.  She does not think she would fit that into her time schedule.  For your diabetes: I have ordered a hemoglobin A1c.  She was able to feel all of the monofilament testing areas today. Corn present, bunion, no ulceration or skin  breaks and positive distal  pulses. She declines podiatry consult.   Please follow-up with Joycelyn Schmid in 1 month.  Follow-up: Return in about 1 month (around 04/25/2020).   This visit occurred during the SARS-CoV-2 public health emergency.  Safety protocols were in place, including screening questions prior to the visit, additional usage of staff PPE, and extensive cleaning of exam room while observing appropriate contact time as indicated for disinfecting solutions.   Denice Paradise, NP

## 2020-03-26 NOTE — Assessment & Plan Note (Signed)
Patient advised:  For leg cramping: I have added blood work today looking at the CBC and iron.  Perform leg stretches in the evenings before bed.  Restrict caffeine during the entire day, take a warm bath before bed (no tub- advised shower- heat),  begin magnesium 250 mg daily -sold over the counter. No access to exercise bicycle and so advised to perform leg stretching before bed and upon waking up while you get out of bed.  She has not had sleep apnea testing and does not want that. She has not had a vascular evaluation.  She does not think she would fit that into her time schedule.

## 2020-03-26 NOTE — Patient Instructions (Addendum)
For leg cramping: I have added blood work today looking at the blood count and iron.  Perform leg stretches in the evenings before bed.  Restrict caffeine during the entire day, take a warm bath before bed, begin magnesium 250 mg daily -sold over the counter  If you have an exercise bicycle at home, you can do that a few minutes before bedtime. Perform leg stretching before bed and upon waking up while you get out of bed.  For your diabetes: I have ordered a hemoglobin A1c.  Please follow-up with margaret in 1 month.  Muscle Cramps and Spasms Muscle cramps and spasms occur when a muscle or muscles tighten and you have no control over this tightening (involuntary muscle contraction). They are a common problem and can develop in any muscle. The most common place is in the calf muscles of the leg. Muscle cramps and muscle spasms are both involuntary muscle contractions, but there are some differences between the two:  Muscle cramps are painful. They come and go and may last for a few seconds or up to 15 minutes. Muscle cramps are often more forceful and last longer than muscle spasms.  Muscle spasms may or may not be painful. They may also last just a few seconds or much longer. Certain medical conditions, such as diabetes or Parkinson's disease, can make it more likely to develop cramps or spasms. However, cramps or spasms are usually not caused by a serious underlying problem. Common causes include:  Doing more physical work or exercise than your body is ready for (overexertion).  Overuse from repeating certain movements too many times.  Remaining in a certain position for a long period of time.  Improper preparation, form, or technique while playing a sport or doing an activity.  Dehydration.  Injury.  Side effects of some medicines.  Abnormally low levels of the salts and minerals in your blood (electrolytes), especially potassium and calcium. This could happen if you are taking water  pills (diuretics) or if you are pregnant. In many cases, the cause of muscle cramps or spasms is not known. Follow these instructions at home: Managing pain and stiffness      Try massaging, stretching, and relaxing the affected muscle. Do this for several minutes at a time.  If directed, apply heat to tight or tense muscles as often as told by your health care provider. Use the heat source that your health care provider recommends, such as a moist heat pack or a heating pad. ? Place a towel between your skin and the heat source. ? Leave the heat on for 20-30 minutes. ? Remove the heat if your skin turns bright red. This is especially important if you are unable to feel pain, heat, or cold. You may have a greater risk of getting burned.  If directed, put ice on the affected area. This may help if you are sore or have pain after a cramp or spasm. ? Put ice in a plastic bag. ? Place a towel between your skin and the bag. ? Leavethe ice on for 20 minutes, 2-3 times a day.  Try taking hot showers or baths to help relax tight muscles. Eating and drinking  Drink enough fluid to keep your urine pale yellow. Staying well hydrated may help prevent cramps or spasms.  Eat a healthy diet that includes plenty of nutrients to help your muscles function. A healthy diet includes fruits and vegetables, lean protein, whole grains, and low-fat or nonfat dairy products. General instructions  If you are having frequent cramps, avoid intense exercise for several days.  Take over-the-counter and prescription medicines only as told by your health care provider.  Pay attention to any changes in your symptoms.  Keep all follow-up visits as told by your health care provider. This is important. Contact a health care provider if:  Your cramps or spasms get more severe or happen more often.  Your cramps or spasms do not improve over time. Summary  Muscle cramps and spasms occur when a muscle or muscles  tighten and you have no control over this tightening (involuntary muscle contraction).  The most common place for cramps or spasms to occur is in the calf muscles of the leg.  Massaging, stretching, and relaxing the affected muscle may relieve the cramp or spasm.  Drink enough fluid to keep your urine pale yellow. Staying well hydrated may help prevent cramps or spasms. This information is not intended to replace advice given to you by your health care provider. Make sure you discuss any questions you have with your health care provider. Document Revised: 03/04/2018 Document Reviewed: 03/04/2018 Elsevier Patient Education  2020 [employer]  Diabetic Neuropathy Diabetic neuropathy refers to nerve damage that is caused by diabetes (diabetes mellitus). Over time, people with diabetes can develop nerve damage throughout the body. There are several types of diabetic neuropathy:  Peripheral neuropathy. This is the most common type of diabetic neuropathy. It causes damage to nerves that carry signals between the spinal cord and other parts of the body (peripheral nerves). This usually affects nerves in the feet and legs first, and may eventually affect the hands and arms. The damage affects the ability to sense touch or temperature.  Autonomic neuropathy. This type causes damage to nerves that control involuntary functions (autonomic nerves). These nerves carry signals that control: ? Heartbeat. ? Body temperature. ? Blood pressure. ? Urination. ? Digestion. ? Sweating. ? Sexual function. ? Response to changing blood sugar (glucose) levels.  Focal neuropathy. This type of nerve damage affects one area of the body, such as an arm, a leg, or the face. The injury may involve one nerve or a small group of nerves. Focal neuropathy can be painful and unpredictable, and occurs most often in older adults with diabetes. This often develops suddenly, but usually improves over time and does not cause  long-term problems.  Proximal neuropathy. This type of nerve damage affects the nerves of the thighs, hips, buttocks, or legs. It causes severe pain, weakness, and muscle death (atrophy), usually in the thigh muscles. It is more common among older men and people who have type 2 diabetes. The length of recovery time may vary. What are the causes? Peripheral, autonomic, and focal neuropathies are caused by diabetes that is not well controlled with treatment. The cause of proximal neuropathy is not known, but it may be caused by inflammation related to uncontrolled blood glucose levels. What are the signs or symptoms? Peripheral neuropathy Peripheral neuropathy develops slowly over time. When the nerves of the feet and legs no longer work, you may experience:  Burning, stabbing, or aching pain in the legs or feet.  Pain or cramping in the legs or feet.  Loss of feeling (numbness) and inability to feel pressure or pain in the feet. This can lead to: ? Thick calluses or sores on areas of constant pressure. ? Ulcers. ? Reduced ability to feel temperature changes.  Foot deformities.  Muscle weakness.  Loss of balance or coordination. Autonomic neuropathy The  symptoms of autonomic neuropathy vary depending on which nerves are affected. Symptoms may include:  Problems with digestion, such as: ? Nausea or vomiting. ? Poor appetite. ? Bloating. ? Diarrhea or constipation. ? Trouble swallowing. ? Losing weight without trying to.  Problems with the heart, blood and lungs, such as: ? Dizziness, especially when standing up. ? Fainting. ? Shortness of breath. ? Irregular heartbeat.  Bladder problems, such as: ? Trouble starting or stopping urination. ? Leaking urine. ? Trouble emptying the bladder. ? Urinary tract infections (UTIs).  Problems with other body functions, such as: ? Sweat. You may sweat too much or too little. ? Temperature. You might get hot easily. Or, you might feel  cold more than usual. ? Sexual function. Men may not be able to get or maintain an erection. Women may have vaginal dryness and difficulty with arousal. Focal neuropathy Symptoms affect only one area of the body. Common symptoms include:  Numbness.  Tingling.  Burning pain.  Prickling feeling.  Very sensitive skin.  Weakness.  Inability to move (paralysis).  Muscle twitching.  Muscles getting smaller (wasting).  Poor coordination.  Double or blurred vision. Proximal neuropathy  Sudden, severe pain in the hip, thigh, or buttocks. Pain may spread from the back into the legs (sciatica).  Pain and numbness in the arms and legs.  Tingling.  Loss of bladder control or bowel control.  Weakness and wasting of thigh muscles.  Difficulty getting up from a seated position.  Abdominal swelling.  Unexplained weight loss. How is this diagnosed? Diagnosis usually involves reviewing your medical history and any symptoms you have. Diagnosis varies depending on the type of neuropathy your health care provider suspects. Peripheral neuropathy Your health care provider will check areas that are affected by your nervous system (neurologic exam), such as your reflexes, how you move, and what you can feel. You may have other tests, such as:  Blood tests.  Removal and examination of fluid that surrounds the spinal cord (lumbar puncture).  CT scan.  MRI.  A test to check the nerves that control muscles (electromyogram, EMG).  Tests of how quickly messages pass through your nerves (nerve conduction velocity tests).  Removal of a small piece of nerve to be examined under a microscope (biopsy). Autonomic neuropathy You may have tests, such as:  Tests to measure your blood pressure and heart rate. This may include monitoring you while you are safely secured to an exam table that moves you from a lying position to an upright position (table tilt test).  Breathing tests to check  your lungs.  Tests to check how food moves through the digestive system (gastric emptying tests).  Blood, sweat, or urine tests.  Ultrasound of your bladder.  Spinal fluid tests. Focal neuropathy This condition may be diagnosed with:  A neurologic exam.  CT scan.  MRI.  EMG.  Nerve conduction velocity tests. Proximal neuropathy There is no test to diagnose this type of neuropathy. You may have tests to rule out other possible causes of this type of neuropathy. Tests may include:  X-rays of your spine and lumbar region.  Lumbar puncture.  MRI. How is this treated? The goal of treatment is to keep nerve damage from getting worse. The most important part of treatment is keeping your blood glucose level and your A1C level within your target range by following your diabetes management plan. Over time, maintaining lower blood glucose levels helps lessen symptoms. In some cases, you may need prescription pain medicine.  Follow these instructions at home:  Lifestyle   Do not use any products that contain nicotine or tobacco, such as cigarettes and e-cigarettes. If you need help quitting, ask your health care provider.  Be physically active every day. Include strength training and balance exercises.  Follow a healthy meal plan.  Work with your health care provider to manage your blood pressure. General instructions  Follow your diabetes management plan as directed. ? Check your blood glucose levels as directed by your health care provider. ? Keep your blood glucose in your target range as directed by your health care provider. ? Have your A1C level checked at least two times a year, or as often as told by your health care provider.  Take over the counter and prescription medicines only as told by your health care provider. This includes insulin and diabetes medicine.  Do not drive or use heavy machinery while taking prescription pain medicines.  Check your skin and feet  every day for cuts, bruises, redness, blisters, or sores.  Keep all follow up visits as told by your health care provider. This is important. Contact a health care provider if:  You have burning, stabbing, or aching pain in your legs or feet.  You are unable to feel pressure or pain in your feet.  You develop problems with digestion, such as: ? Nausea. ? Vomiting. ? Bloating. ? Constipation. ? Diarrhea. ? Abdominal pain.  You have difficulty with urination, such as inability: ? To control when you urinate (incontinence). ? To completely empty the bladder (retention).  You have palpitations.  You feel dizzy, weak, or faint when you stand up. Get help right away if:  You cannot urinate.  You have sudden weakness or loss of coordination.  You have trouble speaking.  You have pain or pressure in your chest.  You have an irregular heart beat.  You have sudden inability to move a part of your body. Summary  Diabetic neuropathy refers to nerve damage that is caused by diabetes. It can affect nerves throughout the entire body, causing numbness and pain in the arms, legs, digestive tract, heart, and other body systems.  Keep your blood glucose level and your blood pressure in your target range, as directed by your health care provider. This can help prevent neuropathy from getting worse.  Check your skin and feet every day for cuts, bruises, redness, blisters, or sores.  Do not use any products that contain nicotine or tobacco, such as cigarettes and e-cigarettes. If you need help quitting, ask your health care provider. This information is not intended to replace advice given to you by your health care provider. Make sure you discuss any questions you have with your health care provider. Document Revised: 11/21/2017 Document Reviewed: 11/13/2016 Elsevier Patient Education  2020 [employer].

## 2020-03-27 LAB — CBC WITH DIFFERENTIAL/PLATELET
Basophils Absolute: 0 10*3/uL (ref 0.0–0.2)
Basos: 1 %
EOS (ABSOLUTE): 0.1 10*3/uL (ref 0.0–0.4)
Eos: 2 %
Hematocrit: 44.8 % (ref 34.0–46.6)
Hemoglobin: 15.5 g/dL (ref 11.1–15.9)
Immature Grans (Abs): 0 10*3/uL (ref 0.0–0.1)
Immature Granulocytes: 0 %
Lymphocytes Absolute: 1.5 10*3/uL (ref 0.7–3.1)
Lymphs: 25 %
MCH: 30.9 pg (ref 26.6–33.0)
MCHC: 34.6 g/dL (ref 31.5–35.7)
MCV: 89 fL (ref 79–97)
Monocytes Absolute: 0.6 10*3/uL (ref 0.1–0.9)
Monocytes: 10 %
Neutrophils Absolute: 3.6 10*3/uL (ref 1.4–7.0)
Neutrophils: 62 %
Platelets: 207 10*3/uL (ref 150–450)
RBC: 5.01 x10E6/uL (ref 3.77–5.28)
RDW: 12.7 % (ref 11.7–15.4)
WBC: 5.7 10*3/uL (ref 3.4–10.8)

## 2020-03-27 LAB — HEMOGLOBIN A1C
Est. average glucose Bld gHb Est-mCnc: 166 mg/dL
Hgb A1c MFr Bld: 7.4 % — ABNORMAL HIGH (ref 4.8–5.6)

## 2020-03-27 LAB — IRON,TIBC AND FERRITIN PANEL
Ferritin: 116 ng/mL (ref 15–150)
Iron Saturation: 25 % (ref 15–55)
Iron: 82 ug/dL (ref 27–139)
Total Iron Binding Capacity: 327 ug/dL (ref 250–450)
UIBC: 245 ug/dL (ref 118–369)

## 2020-03-30 ENCOUNTER — Other Ambulatory Visit: Payer: Self-pay | Admitting: Family

## 2020-03-30 DIAGNOSIS — K2100 Gastro-esophageal reflux disease with esophagitis, without bleeding: Secondary | ICD-10-CM

## 2020-04-10 ENCOUNTER — Other Ambulatory Visit: Payer: Self-pay | Admitting: Family

## 2020-04-10 DIAGNOSIS — R252 Cramp and spasm: Secondary | ICD-10-CM

## 2020-05-10 ENCOUNTER — Ambulatory Visit: Payer: No Typology Code available for payment source | Admitting: Family

## 2020-06-14 ENCOUNTER — Other Ambulatory Visit: Payer: Self-pay | Admitting: Family

## 2020-06-19 ENCOUNTER — Other Ambulatory Visit: Payer: Self-pay | Admitting: Family

## 2020-06-19 DIAGNOSIS — M25552 Pain in left hip: Secondary | ICD-10-CM

## 2020-07-01 ENCOUNTER — Other Ambulatory Visit: Payer: Self-pay | Admitting: Family

## 2020-07-01 DIAGNOSIS — IMO0002 Reserved for concepts with insufficient information to code with codable children: Secondary | ICD-10-CM

## 2020-07-08 ENCOUNTER — Other Ambulatory Visit: Payer: Self-pay | Admitting: Family

## 2020-07-08 DIAGNOSIS — E785 Hyperlipidemia, unspecified: Secondary | ICD-10-CM

## 2020-07-24 ENCOUNTER — Other Ambulatory Visit: Payer: Self-pay | Admitting: Family

## 2020-08-20 ENCOUNTER — Other Ambulatory Visit: Payer: Self-pay | Admitting: Internal Medicine

## 2020-08-20 DIAGNOSIS — E119 Type 2 diabetes mellitus without complications: Secondary | ICD-10-CM

## 2020-08-24 ENCOUNTER — Ambulatory Visit: Payer: No Typology Code available for payment source | Admitting: Family

## 2020-08-24 ENCOUNTER — Ambulatory Visit (INDEPENDENT_AMBULATORY_CARE_PROVIDER_SITE_OTHER): Payer: No Typology Code available for payment source | Admitting: Family

## 2020-08-24 ENCOUNTER — Encounter: Payer: Self-pay | Admitting: Family

## 2020-08-24 ENCOUNTER — Other Ambulatory Visit: Payer: Self-pay

## 2020-08-24 VITALS — BP 136/78 | HR 96 | Temp 98.1°F | Ht 60.0 in | Wt 158.8 lb

## 2020-08-24 DIAGNOSIS — E785 Hyperlipidemia, unspecified: Secondary | ICD-10-CM

## 2020-08-24 DIAGNOSIS — E1140 Type 2 diabetes mellitus with diabetic neuropathy, unspecified: Secondary | ICD-10-CM

## 2020-08-24 DIAGNOSIS — E1165 Type 2 diabetes mellitus with hyperglycemia: Secondary | ICD-10-CM

## 2020-08-24 DIAGNOSIS — F339 Major depressive disorder, recurrent, unspecified: Secondary | ICD-10-CM

## 2020-08-24 DIAGNOSIS — I10 Essential (primary) hypertension: Secondary | ICD-10-CM

## 2020-08-24 DIAGNOSIS — E119 Type 2 diabetes mellitus without complications: Secondary | ICD-10-CM

## 2020-08-24 DIAGNOSIS — E041 Nontoxic single thyroid nodule: Secondary | ICD-10-CM

## 2020-08-24 DIAGNOSIS — G6289 Other specified polyneuropathies: Secondary | ICD-10-CM | POA: Diagnosis not present

## 2020-08-24 DIAGNOSIS — IMO0002 Reserved for concepts with insufficient information to code with codable children: Secondary | ICD-10-CM

## 2020-08-24 LAB — POCT GLYCOSYLATED HEMOGLOBIN (HGB A1C): Hemoglobin A1C: 7.8 % — AB (ref 4.0–5.6)

## 2020-08-24 MED ORDER — PREGABALIN 50 MG PO CAPS: 50.0000 mg | ORAL_CAPSULE | Freq: Every day | ORAL | 1 refills | Status: DC

## 2020-08-24 NOTE — Patient Instructions (Addendum)
Please resume all medications and start checking fasting blood sugars and send me a log in one week so I can adjust your medications.   For leg cramps,  You may trial vitamin B complex (three times daily, containing 30 mg of vitamin B6) or vitamin E (800 international units before bed  Referral to vascular. Let us know if you dont hear back within a week in regards to an appointment being scheduled.    We will also start lyrica low dose after you have resumed all your medications. Since new , I want you to start separately. Please store in safe place as controlled substance.   As discussed, very important for you to call [facility] contact below to discuss thyroid nodule treatment.      Alphia Kava, NP  [address]  [place], [place] [address]  [phone]      Let me know how you are doing.

## 2020-08-24 NOTE — Progress Notes (Signed)
Subjective:    Patient ID: Brittany [name], female    DOB: 1956/08/30, 64 y.o.   MRN: [id number]  CC: [name] is a 64 y.o. female who presents today for follow up.   HPI: Complains left lateral foot numbness which extends under foot, for past year, worsening. Also complains left leg calve cramping. This occurs at night when sitting or trying to sleep.  Pain brings her to tears. Swelling on left dorsal aspect of foot at end of day at work in which she stands a lot . Resolves with elevation. Foot is not cold, discolored.  Worse when standing. Wearing supportive shoes at work. No numbness in back or coming down leg. Tried gabapentin in the past which wasn't  Helpful. No drug or alcohol use.   She has stopped taking every medication 2 weeks ago without weaning as wasn't sure wha was causing leg cramps without improvement. No pain in calves in walking.   Never went back to endocrine to discuss treatment of thyroid nodule treatment due to cost.   Takes mobic 7.5mg  prn for hand pain.   Depression- cymbalta 60mg  had been helpful. She has cymbalta 30mg  tablets at home. She feels she is more tearful today as she can tell she hasnt taken cymbalta. She worries about her son who has drug addiction.  No si/hi.   DM- had been following with endocrine, Roe Coombs.   HTN- no values at home.   HLD- had stopped both zetia and crestor couple of weeks ago.      HISTORY:  Past Medical History:  Diagnosis Date  . Diabetes mellitus without complication ([place])   . Hyperlipidemia   . Hypertension    pt states on medication as prevenative  . Obesity    Past Surgical History:  Procedure Laterality Date  . ABDOMINAL HYSTERECTOMY    . COLONOSCOPY WITH PROPOFOL N/A 06/25/2015   Procedure: COLONOSCOPY WITH PROPOFOL;  Surgeon: Lucilla Lame, MD;  Location: [place] [facility];  Service: Endoscopy;  Laterality: N/A;  Diabetic - oral meds  . ELBOW SURGERY Left   . HIP SURGERY Right    twice    Family History  Problem Relation Age of Onset  . Diabetes Mother   . Cancer Father        unknown; '90 % cancer'  . Diabetes Sister   . Diabetes Brother   . Breast cancer Cousin        unknown age at dx.  . Thyroid cancer Neg Hx     Allergies: Jardiance [empagliflozin], Atorvastatin, and Victoza [liraglutide] Current Outpatient Medications on File Prior to Visit  Medication Sig Dispense Refill  . Ascorbic Acid (VITAMIN C) 1000 MG tablet Take 1,000 mg by mouth daily.    . blood glucose meter kit and supplies Dispense based on patient and insurance preference. Use to check blood sugars daily (FOR ICD-10 E10.9, E11.9). 1 each 0  . Blood Glucose Monitoring Suppl (ACCU-CHEK GUIDE) w/Device KIT 1 Device by Does not apply route daily as needed. 1 kit 0  . DULoxetine (CYMBALTA) 60 MG capsule TAKE 1 CAPSULE BY MOUTH EVERY DAY 90 capsule 1  . ELDERBERRY PO Take 1 tablet by mouth daily.    Marland Kitchen ezetimibe (ZETIA) 10 MG tablet TAKE 1 TABLET BY MOUTH EVERY DAY 90 tablet 3  . FARXIGA 10 MG TABS tablet TAKE 1 TABLET BY MOUTH DAILY BEFORE BREAKFAST 30 tablet 4  . glimepiride (AMARYL) 4 MG tablet TAKE 1 TABLET BY MOUTH EVERY DAY  WITH BREAKFAST 90 tablet 1  . glucose blood (ACCU-CHEK GUIDE) test strip Use as instructed 100 each 12  . meloxicam (MOBIC) 7.5 MG tablet TAKE 1 TABLET BY MOUTH DAILY AS NEEDED FOR PAIN 90 tablet 1  . omega-3 acid ethyl esters (LOVAZA) 1 g capsule Take 2 capsules (2 g total) by mouth 2 (two) times daily. 120 capsule 11  . omeprazole (PRILOSEC) 40 MG capsule TAKE 1 CAPSULE (40 MG TOTAL) BY MOUTH DAILY AS NEEDED. 90 capsule 1  . rosuvastatin (CRESTOR) 10 MG tablet Take 1 tablet (10 mg total) by mouth daily. Dose decrease. 90 tablet 1  . TRULICITY 1.5 MG/0.5ML SOPN INJECT 1.5 MG AS DIRECTED ONCE A WEEK 15 mL 1   No current facility-administered medications on file prior to visit.    Social History   Tobacco Use  . Smoking status: Never Smoker  . Smokeless tobacco: Never Used   Vaping Use  . Vaping Use: Never used  Substance Use Topics  . Alcohol use: Yes    Alcohol/week: 0.0 standard drinks    Comment: 1 times a year-wine  . Drug use: No    Review of Systems  Constitutional: Negative for chills and fever.  Respiratory: Negative for cough.   Cardiovascular: Positive for leg swelling. Negative for chest pain and palpitations.  Gastrointestinal: Negative for nausea and vomiting.  Musculoskeletal: Positive for arthralgias.  Neurological: Positive for numbness.  Psychiatric/Behavioral: Negative for sleep disturbance and suicidal ideas. The patient is nervous/anxious.       Objective:    BP 136/78   Pulse 96   Temp 98.1 F (36.7 C) (Oral)   Ht 5' (1.524 m)   Wt 158 lb 12.8 oz (72 kg)   SpO2 98%   BMI 31.01 kg/m  BP Readings from Last 3 Encounters:  08/24/20 136/78  03/26/20 120/78  01/05/20 104/60   Wt Readings from Last 3 Encounters:  08/24/20 158 lb 12.8 oz (72 kg)  03/26/20 155 lb 3.2 oz (70.4 kg)  01/05/20 153 lb 4 oz (69.5 kg)    Physical Exam Vitals reviewed.  Constitutional:      Appearance: She is well-developed.  Eyes:     Conjunctiva/sclera: Conjunctivae normal.  Cardiovascular:     Rate and Rhythm: Normal rate and regular rhythm.     Pulses: Normal pulses.     Heart sounds: Normal heart sounds.  Pulmonary:     Effort: Pulmonary effort is normal.     Breath sounds: Normal breath sounds. No wheezing, rhonchi or rales.  Musculoskeletal:     Right lower leg: No edema.     Left lower leg: No edema.  Skin:    General: Skin is warm and dry.  Neurological:     Mental Status: She is alert.  Psychiatric:        Speech: Speech normal.        Behavior: Behavior normal.        Thought Content: Thought content normal.        Assessment & Plan:   Problem List Items Addressed This Visit      Cardiovascular and Mediastinum   Essential hypertension    Elevated today. Historically controlled. However she needs to be on ACE-I  or ARB in setting of DM.She has been on lisinopril in the past and she discontinued. Will discuss with patient at follow up.         Endocrine   Thyroid nodule    Lost to follow up. Explained the risk  of not treating if nodule were to become malignant. Expressed the importance of follow up with [facility] and advised phone call to initiate treatment as well as discussion of payment plan/assistance through [facility]. Patient verbalized understanding.      Type 2 diabetes, uncontrolled, with neuropathy (HCC)    Lab Results  Component Value Date   HGBA1C 7.8 (A) 08/24/2020   Uncontrolled. Advised to resume amaryl $RemoveBefor'4mg'uaxeaHUloJEj$  qd, farxiga $RemoveBef'10mg'IDwSsYPUQl$  qd, trulicity 1.$RemoveBeforeD'5mg'kDQkXoXZdEVvRv$  qwk and then send me FBG of one week so I can further make adjustments.         Nervous and Auditory   Peripheral neuropathy    Uncontrolled. Trial lyrica $RemoveBefo'50mg'BFySjEdfWoW$  qhs.She is aware that this medication is a controlled substance and requires  Monitoring every 3 months. She knows to store in safe place. CSC signed today.  I looked up patient on [place] Controlled Substances Reporting System PMP AWARE and saw no activity that raised concern of inappropriate use.   Referral to vascular due to left pedal swelling ( none on exam today). Close follow up.       Relevant Medications   pregabalin (LYRICA) 50 MG capsule     Other   Depression, recurrent (HCC)    Uncontrolled. Advised to resume cymbalta $RemoveBeforeD'30mg'YeYBEsXNCBqsjj$  and then increase to prior dose of $Remov'60mg'bJQvqc$  after 4-5 days. She has cymbalta 30 mg tablet at home and declines refill of this dose.       Hyperlipidemia    Controlled earlier this year. Advised to resume crestor $RemoveBefore'10mg'VzojQsebvPTxY$  and zetia $RemoveB'10mg'pXrOIZCK$ .        Other Visit Diagnoses    Type 2 diabetes mellitus without complication, without long-term current use of insulin ([place])    -  Primary   Relevant Medications   pregabalin (LYRICA) 50 MG capsule   Other Relevant Orders   POCT HgB A1C (Completed)   Ambulatory referral to Vascular Surgery   B12 and Folate Panel (Completed)    Comprehensive metabolic panel (Completed)   TSH (Completed)       I am having Linard Millers start on pregabalin. I am also having her maintain her omega-3 acid ethyl esters, blood glucose meter kit and supplies, Accu-Chek Guide, Accu-Chek Guide, vitamin C, ELDERBERRY PO, rosuvastatin, omeprazole, Farxiga, meloxicam, DULoxetine, ezetimibe, Trulicity, and glimepiride.   Meds ordered this encounter  Medications  . pregabalin (LYRICA) 50 MG capsule    Sig: Take 1 capsule (50 mg total) by mouth at bedtime.    Dispense:  30 capsule    Refill:  1    Order Specific Question:   Supervising Provider    Answer:   Crecencio Mc [[id number]]    Return precautions given.   Risks, benefits, and alternatives of the medications and treatment plan prescribed today were discussed, and patient expressed understanding.   Education regarding symptom management and diagnosis given to patient on AVS.  Continue to follow with Burnard Hawthorne, FNP for routine health maintenance.   Linard Millers and I agreed with plan.   Mable Paris, FNP

## 2020-08-24 NOTE — Assessment & Plan Note (Addendum)
Elevated today. Historically controlled. However she needs to be on ACE-I or ARB in setting of DM.She has been on lisinopril in the past and she discontinued. Will discuss with patient at follow up.

## 2020-08-24 NOTE — Assessment & Plan Note (Signed)
Controlled earlier this year. Advised to resume crestor 10mg  and zetia 10mg .

## 2020-08-24 NOTE — Progress Notes (Signed)
Pre visit review using our clinic review tool, if applicable. No additional management support is needed unless otherwise documented below in the visit note. 

## 2020-08-24 NOTE — Assessment & Plan Note (Addendum)
Uncontrolled. Trial lyrica 50mg  qhs.She is aware that this medication is a controlled substance and requires  Monitoring every 3 months. She knows to store in safe place. CSC signed today.  I looked up patient on [place] Controlled Substances Reporting System PMP AWARE and saw no activity that raised concern of inappropriate use.   Referral to vascular due to left pedal swelling ( none on exam today). Close follow up.

## 2020-08-24 NOTE — Assessment & Plan Note (Signed)
Lab Results  Component Value Date   HGBA1C 7.8 (A) 08/24/2020   Uncontrolled. Advised to resume amaryl 4mg  qd, farxiga 10mg  qd, trulicity 1.5mg  qwk and then send me FBG of one week so I can further make adjustments.

## 2020-08-25 ENCOUNTER — Telehealth: Payer: Self-pay

## 2020-08-25 ENCOUNTER — Telehealth: Payer: Self-pay | Admitting: Family

## 2020-08-25 ENCOUNTER — Encounter: Payer: Self-pay | Admitting: Family

## 2020-08-25 DIAGNOSIS — E041 Nontoxic single thyroid nodule: Secondary | ICD-10-CM | POA: Insufficient documentation

## 2020-08-25 DIAGNOSIS — F339 Major depressive disorder, recurrent, unspecified: Secondary | ICD-10-CM | POA: Insufficient documentation

## 2020-08-25 LAB — COMPREHENSIVE METABOLIC PANEL
ALT: 32 IU/L (ref 0–32)
AST: 20 IU/L (ref 0–40)
Albumin/Globulin Ratio: 1.9 (ref 1.2–2.2)
Albumin: 4.5 g/dL (ref 3.8–4.8)
Alkaline Phosphatase: 78 IU/L (ref 44–121)
BUN/Creatinine Ratio: 38 — ABNORMAL HIGH (ref 12–28)
BUN: 20 mg/dL (ref 8–27)
Bilirubin Total: 0.3 mg/dL (ref 0.0–1.2)
CO2: 22 mmol/L (ref 20–29)
Calcium: 9.6 mg/dL (ref 8.7–10.3)
Chloride: 100 mmol/L (ref 96–106)
Creatinine, Ser: 0.52 mg/dL — ABNORMAL LOW (ref 0.57–1.00)
GFR calc Af Amer: 118 mL/min/{1.73_m2} (ref >59)
GFR calc non Af Amer: 102 mL/min/{1.73_m2} (ref >59)
Globulin, Total: 2.4 g/dL (ref 1.5–4.5)
Glucose: 307 mg/dL — ABNORMAL HIGH (ref 65–99)
Potassium: 4.4 mmol/L (ref 3.5–5.2)
Sodium: 139 mmol/L (ref 134–144)
Total Protein: 6.9 g/dL (ref 6.0–8.5)

## 2020-08-25 LAB — B12 AND FOLATE PANEL
Folate: 10.4 ng/mL (ref >3.0)
Vitamin B-12: 518 pg/mL (ref 232–1245)

## 2020-08-25 LAB — TSH: TSH: 0.251 u[IU]/mL — ABNORMAL LOW (ref 0.450–4.500)

## 2020-08-25 NOTE — Assessment & Plan Note (Signed)
Lost to follow up. Explained the risk of not treating if nodule were to become malignant. Expressed the importance of follow up with [facility] and advised phone call to initiate treatment as well as discussion of payment plan/assistance through [facility]. Patient verbalized understanding.

## 2020-08-25 NOTE — Telephone Encounter (Signed)
Pt is returning your call about lab results.  °

## 2020-08-25 NOTE — Assessment & Plan Note (Signed)
Uncontrolled. Advised to resume cymbalta 30mg  and then increase to prior dose of 60mg  after 4-5 days. She has cymbalta 30 mg tablet at home and declines refill of this dose.

## 2020-08-25 NOTE — Telephone Encounter (Signed)
Spoke with patient about her lab results.

## 2020-08-27 NOTE — Telephone Encounter (Signed)
close

## 2020-09-07 ENCOUNTER — Other Ambulatory Visit (INDEPENDENT_AMBULATORY_CARE_PROVIDER_SITE_OTHER): Payer: Self-pay | Admitting: Vascular Surgery

## 2020-09-07 DIAGNOSIS — R200 Anesthesia of skin: Secondary | ICD-10-CM

## 2020-09-07 DIAGNOSIS — R208 Other disturbances of skin sensation: Secondary | ICD-10-CM

## 2020-09-07 DIAGNOSIS — M79672 Pain in left foot: Secondary | ICD-10-CM

## 2020-09-08 DIAGNOSIS — I872 Venous insufficiency (chronic) (peripheral): Secondary | ICD-10-CM | POA: Insufficient documentation

## 2020-09-08 DIAGNOSIS — I890 Lymphedema, not elsewhere classified: Secondary | ICD-10-CM | POA: Insufficient documentation

## 2020-09-08 NOTE — Progress Notes (Signed)
MRN : [id number]  Brittany [name] is a 64 y.o. ([birth year]) female who presents with chief complaint of No chief complaint on file. Marland Kitchen  History of Present Illness:   The patient is seen for evaluation of painful lower extremities. Patient notes the pain is variable and not always associated with activity, she notes that her feet are numb and tingling pretty much all the time.  She does have a fairly well-documented history of neuropathy secondary to her diabetes.  The pain is somewhat consistent day to day occurring on most days it occurs with activity but it also occurs with laying down.  It seems to radiate from the lower back down the back of the leg often. The patient also notes the pain also occurs with standing and carrying heavy loads and routinely seems worse as the day wears on. The pain has been progressive over the past several years. The patient states these symptoms are causing  a profound negative impact on quality of life and daily activities.  The patient denies rest pain or dangling of an extremity off the side of the bed during the night for relief. No open wounds or sores at this time. No history of DVT or phlebitis. No prior interventions or surgeries.  There is not a documented history of back problems and DJD of the lumbar and sacral spine.   ABIs are normal bilaterally with triphasic signals in both the dorsalis pedis posterior tibial.  Toe tracings are normal with a sharp upstroke a narrow waveform with a pronounced dicrotic notch.  No outpatient medications have been marked as taking for the 09/09/20 encounter (Appointment) with Gilda Crease, Latina Craver, MD.    Past Medical History:  Diagnosis Date  . Diabetes mellitus without complication (HCC)   . Hyperlipidemia   . Hypertension    pt states on medication as prevenative  . Obesity     Past Surgical History:  Procedure Laterality Date  . ABDOMINAL HYSTERECTOMY    . COLONOSCOPY WITH PROPOFOL N/A 06/25/2015    Procedure: COLONOSCOPY WITH PROPOFOL;  Surgeon: Midge Minium, MD;  Location: [facility];  Service: Endoscopy;  Laterality: N/A;  Diabetic - oral meds  . ELBOW SURGERY Left   . HIP SURGERY Right    twice    Social History Social History   Tobacco Use  . Smoking status: Never Smoker  . Smokeless tobacco: Never Used  Vaping Use  . Vaping Use: Never used  Substance Use Topics  . Alcohol use: Yes    Alcohol/week: 0.0 standard drinks    Comment: 1 times a year-wine  . Drug use: No    Family History Family History  Problem Relation Age of Onset  . Diabetes Mother   . Cancer Father        unknown; '90 % cancer'  . Diabetes Sister   . Diabetes Brother   . Breast cancer Cousin        unknown age at dx.  . Thyroid cancer Neg Hx   No family history of bleeding/clotting disorders, porphyria or autoimmune disease   Allergies  Allergen Reactions  . Jardiance [Empagliflozin] Other (See Comments)    Muscle aches, blur vision   . Atorvastatin Other (See Comments)    myalgia  . Victoza [Liraglutide] Other (See Comments)    'knot on stomach.'     REVIEW OF SYSTEMS (Negative unless checked)  Constitutional: [] Weight loss  [] Fever  [] Chills Cardiac: [] Chest pain   [] Chest pressure   [] Palpitations   []   Shortness of breath when laying flat   [] Shortness of breath with exertion. Vascular:  [] Pain in legs with walking   [x] Pain in legs at rest  [] History of DVT   [] Phlebitis   [x] Swelling in legs   [] Varicose veins   [] Non-healing ulcers Pulmonary:   [] Uses home oxygen   [] Productive cough   [] Hemoptysis   [] Wheeze  [] COPD   [] Asthma Neurologic:  [] Dizziness   [] Seizures   [] History of stroke   [] History of TIA  [] Aphasia   [] Vissual changes   [] Weakness or numbness in arm   [x] Weakness or numbness in leg Musculoskeletal:   [] Joint swelling   [] Joint pain   [] Low back pain Hematologic:  [] Easy bruising  [] Easy bleeding   [] Hypercoagulable state   [] Anemic Gastrointestinal:   [] Diarrhea   [] Vomiting  [x] Gastroesophageal reflux/heartburn   [] Difficulty swallowing. Genitourinary:  [] Chronic kidney disease   [] Difficult urination  [] Frequent urination   [] Blood in urine Skin:  [] Rashes   [] Ulcers  Psychological:  [] History of anxiety   []  History of major depression.  Physical Examination  There were no vitals filed for this visit. There is no height or weight on file to calculate BMI. Gen: WD/WN, NAD Head: [place]/AT, No temporalis wasting.  Ear/Nose/Throat: Hearing grossly intact, nares w/o erythema or drainage, poor dentition Eyes: PER, EOMI, sclera nonicteric.  Neck: Supple, no masses.  No bruit or JVD.  Pulmonary:  Good air movement, clear to auscultation bilaterally, no use of accessory muscles.  Cardiac: RRR, normal S1, S2, no Murmurs. Vascular: scattered varicosities present bilaterally.  Mild venous stasis changes to the legs bilaterally.  trace soft pitting edema Vessel Right Left  Radial Palpable Palpable  PT Palpable Palpable  DP Palpable Palpable  Gastrointestinal: soft, non-distended. No guarding/no peritoneal signs.  Musculoskeletal: M/S 5/5 throughout.  No deformity or atrophy.  Neurologic: CN 2-12 intact. Pain and light touch intact in extremities.  Symmetrical.  Speech is fluent. Motor exam as listed above. Psychiatric: Judgment intact, Mood & affect appropriate for pt's clinical situation. Dermatologic: Venous rashes no ulcers noted.  No changes consistent with cellulitis.   CBC Lab Results  Component Value Date   WBC 5.7 03/26/2020   HGB 15.5 03/26/2020   HCT 44.8 03/26/2020   MCV 89 03/26/2020   PLT 207 03/26/2020    BMET    Component Value Date/Time   NA 139 08/24/2020 1346   K 4.4 08/24/2020 1346   CL 100 08/24/2020 1346   CO2 22 08/24/2020 1346   GLUCOSE 307 (H) 08/24/2020 1346   GLUCOSE 154 (H) 05/09/2019 0826   BUN 20 08/24/2020 1346   CREATININE 0.52 (L) 08/24/2020 1346   CREATININE 0.78 04/13/2016 0904   CALCIUM 9.6  08/24/2020 1346   GFRNONAA 102 08/24/2020 1346   GFRAA 118 08/24/2020 1346   CrCl cannot be calculated (Unknown ideal weight.).  COAG No results found for: INR, PROTIME  Radiology No results found.   Assessment/Plan 1. Pain of left lower extremity Recommend:  I do not find evidence of Vascular pathology that would explain the patient's symptoms  The patient has atypical pain symptoms for vascular disease  I do not find evidence of Vascular pathology that would explain the patient's symptoms and I suspect the patient is c/o pseudoclaudication.  Patient should have an evaluation of his LS spine which I defer to the primary service.  Noninvasive studies including venous ultrasound of the legs do not identify vascular problems  The patient should continue walking and begin  a more formal exercise program. The patient should continue his antiplatelet therapy and aggressive treatment of the lipid abnormalities. The patient should begin wearing graduated compression socks 15-20 mmHg strength to control her mild edema.  Patient will follow-up with me on a PRN basis  Further work-up of her lower extremity pain is deferred to the primary service     2. Chronic venous insufficiency No surgery or intervention at this point in time.  I have reviewed my discussion with the patient regarding venous insufficiency and why it causes symptoms. I have discussed with the patient the chronic skin changes that accompany venous insufficiency and the long term sequela such as ulceration. Patient will contnue wearing graduated compression stockings on a daily basis, as this has provided excellent control of his edema. The patient will put the stockings on first thing in the morning and removing them in the evening. The patient is reminded not to sleep in the stockings.  In addition, behavioral modification including elevation during the day will be initiated. Exercise is strongly encouraged.  Given  the patient's good control and lack of any problems regarding the venous insufficiency and lymphedema a lymph pump in not need at this time.  The patient will follow up with me PRN should anything change.  The patient voices agreement with this plan.   3. Essential hypertension Continue antihypertensive medications as already ordered, these medications have been reviewed and there are no changes at this time.   4. Gastroesophageal reflux disease, unspecified whether esophagitis present Continue PPI as already ordered, this medication has been reviewed and there are no changes at this time.  Avoidence of caffeine and alcohol  Moderate elevation of the head of the bed   5. Type 2 diabetes, uncontrolled, with neuropathy (HCC) Continue hypoglycemic medications as already ordered, these medications have been reviewed and there are no changes at this time.  Hgb A1C to be monitored as already arranged by primary service     Levora Dredge, MD  09/08/2020 11:02 AM

## 2020-09-09 ENCOUNTER — Ambulatory Visit (INDEPENDENT_AMBULATORY_CARE_PROVIDER_SITE_OTHER): Payer: No Typology Code available for payment source

## 2020-09-09 ENCOUNTER — Other Ambulatory Visit: Payer: Self-pay

## 2020-09-09 ENCOUNTER — Encounter (INDEPENDENT_AMBULATORY_CARE_PROVIDER_SITE_OTHER): Payer: Self-pay | Admitting: Vascular Surgery

## 2020-09-09 ENCOUNTER — Ambulatory Visit (INDEPENDENT_AMBULATORY_CARE_PROVIDER_SITE_OTHER): Payer: No Typology Code available for payment source | Admitting: Vascular Surgery

## 2020-09-09 VITALS — BP 129/78 | HR 87 | Ht 60.0 in | Wt 159.0 lb

## 2020-09-09 DIAGNOSIS — IMO0002 Reserved for concepts with insufficient information to code with codable children: Secondary | ICD-10-CM

## 2020-09-09 DIAGNOSIS — I872 Venous insufficiency (chronic) (peripheral): Secondary | ICD-10-CM | POA: Diagnosis not present

## 2020-09-09 DIAGNOSIS — I890 Lymphedema, not elsewhere classified: Secondary | ICD-10-CM

## 2020-09-09 DIAGNOSIS — M79672 Pain in left foot: Secondary | ICD-10-CM | POA: Diagnosis not present

## 2020-09-09 DIAGNOSIS — I10 Essential (primary) hypertension: Secondary | ICD-10-CM

## 2020-09-09 DIAGNOSIS — E1165 Type 2 diabetes mellitus with hyperglycemia: Secondary | ICD-10-CM

## 2020-09-09 DIAGNOSIS — E1140 Type 2 diabetes mellitus with diabetic neuropathy, unspecified: Secondary | ICD-10-CM

## 2020-09-09 DIAGNOSIS — R208 Other disturbances of skin sensation: Secondary | ICD-10-CM

## 2020-09-09 DIAGNOSIS — K219 Gastro-esophageal reflux disease without esophagitis: Secondary | ICD-10-CM | POA: Diagnosis not present

## 2020-09-09 DIAGNOSIS — R200 Anesthesia of skin: Secondary | ICD-10-CM

## 2020-09-09 DIAGNOSIS — M79605 Pain in left leg: Secondary | ICD-10-CM | POA: Diagnosis not present

## 2020-09-13 LAB — HM DIABETES EYE EXAM

## 2020-09-15 ENCOUNTER — Telehealth: Payer: Self-pay | Admitting: Family

## 2020-09-15 NOTE — Telephone Encounter (Signed)
Pt called in wanted to know about getting cortizone injections in both hands due to they are locking up on her and she stated about getting hand surgery.

## 2020-09-15 NOTE — Telephone Encounter (Signed)
Please schedule appointment with Dr. Patsy Lager next week.

## 2020-09-15 NOTE — Telephone Encounter (Signed)
Scheduled pt on 09/20/20 at 9:40

## 2020-09-20 ENCOUNTER — Ambulatory Visit (INDEPENDENT_AMBULATORY_CARE_PROVIDER_SITE_OTHER): Payer: No Typology Code available for payment source | Admitting: Family Medicine

## 2020-09-20 ENCOUNTER — Telehealth: Payer: Self-pay | Admitting: Family

## 2020-09-20 ENCOUNTER — Encounter: Payer: Self-pay | Admitting: Family Medicine

## 2020-09-20 ENCOUNTER — Other Ambulatory Visit: Payer: Self-pay

## 2020-09-20 VITALS — BP 120/68 | HR 85 | Temp 97.8°F | Ht 60.0 in | Wt 159.5 lb

## 2020-09-20 DIAGNOSIS — M65342 Trigger finger, left ring finger: Secondary | ICD-10-CM

## 2020-09-20 DIAGNOSIS — E11319 Type 2 diabetes mellitus with unspecified diabetic retinopathy without macular edema: Secondary | ICD-10-CM

## 2020-09-20 DIAGNOSIS — M65341 Trigger finger, right ring finger: Secondary | ICD-10-CM | POA: Diagnosis not present

## 2020-09-20 MED ORDER — TRIAMCINOLONE ACETONIDE 40 MG/ML IJ SUSP
20.0000 mg | Freq: Once | INTRAMUSCULAR | Status: AC
  Administered 2020-09-20: 20 mg via INTRA_ARTICULAR

## 2020-09-20 NOTE — Addendum Note (Signed)
Addended by: Damita Lack on: 09/20/2020 10:32 AM   Modules accepted: Orders

## 2020-09-20 NOTE — Telephone Encounter (Signed)
Patient dropped off sugar readings. Readings are up front in Arnett's color folder.

## 2020-09-20 NOTE — Progress Notes (Signed)
    Anjolie Majer T. Analucia Hush, MD, CAQ Sports Medicine  Primary Care and Sports Medicine [facility] at [facility] [address] [place], [address]  Phone: [phone]  FAX: [phone]  TYNISA VOHS - 64 y.o. female  MRN [id number]  Date of Birth: 07-10-1956  Date: 09/20/2020  PCP: Allegra Grana, FNP  Referral: Allegra Grana, FNP  Chief Complaint  Patient presents with  . Hand Pain    Discuss Surgical Options    This visit occurred during the SARS-CoV-2 public health emergency.  Safety protocols were in place, including screening questions prior to the visit, additional usage of staff PPE, and extensive cleaning of exam room while observing appropriate contact time as indicated for disinfecting solutions.   Subjective:   LILIA LETTERMAN is a 64 y.o. very pleasant female patient with Body mass index is 31.15 kg/m. who presents with the following:  We talked about trigger fingers in general, pathology, generalized anatomy as well as triggering at the A1 pulley.  I did inject her left fourth digit once about 9 months ago, and this is returned.  She also has some triggering on the right fourth digit.  This does cause some pain when she triggers.  This does cause her some limitation at work where she uses her hands all the time.  There is still a very good chance that particular on the right trigger finger injection alone will resolve her symptoms.  Is also reasonable to reinject her left fourth digit.  We went over probabilities, and if particularly the left returns then consultation with hand surgery is appropriate.  If that is the case, then she will contact me directly.  Procedures x 2  Tendon Sheath Injection Procedure Note NIYAH MAMARIL [birth year] Date of procedure: 09/20/2020  Procedure: Tendon Sheath Injection for Trigger Fingerm L 4th Indications: Pain  Procedure Details Verbal consent was obtained. Risks (including potential risk for  skin lightening and potential atrophy), benefits and alternatives were discussed. Prepped with Chloraprep and Ethyl Chloride used for anesthesia. Under sterile conditions, patient injected at palmar crease aiming distally with 45 degree angle towards nodule; injected directly into tendon sheath. Medication flowed freely without resistance.  Needle size: 22 gauge 1 1/2 inch Injection: 1/2 cc of Lidocaine 1% and Kenalog 20 mg Medication: 1/2 cc of Kenalog 40 mg (equaling Kenalog 20 mg)   Tendon Sheath Injection Procedure Note LAKIE MCLOUTH [birth year] Date of procedure: 09/20/2020  Procedure: Tendon Sheath Injection for Trigger Finger, R 4th Indications: Pain  Procedure Details Verbal consent was obtained. Risks (including potential risk for skin lightening and potential atrophy), benefits and alternatives were discussed. Prepped with Chloraprep and Ethyl Chloride used for anesthesia. Under sterile conditions, patient injected at palmar crease aiming distally with 45 degree angle towards nodule; injected directly into tendon sheath. Medication flowed freely without resistance.  Needle size: 22 gauge 1 1/2 inch Injection: 1/2 cc of Lidocaine 1% and Kenalog 20 mg Medication: 1/2 cc of Kenalog 40 mg (equaling Kenalog 20 mg)   Signed,  Sunni Richardson T. Tzion Wedel, MD

## 2020-09-21 NOTE — Telephone Encounter (Signed)
Blood sugar readings the last month:  11/4 6:30am 230 11/5 6:25am 155 11/6 5:42am 162 11/7 7:20am 189 11/8 6:30am 149 11/9 6:20am 144 11/10 4:30p 153 11/11 6:28am 135 11/12 6:18am 150 11/13 6:43am 157 11/15 6:35am 156 11/17 8:30am 167 11/18 4:55am 167 11/19 6:47am 159 11/20 6:28am 182

## 2020-09-23 ENCOUNTER — Other Ambulatory Visit: Payer: Self-pay | Admitting: Family

## 2020-09-23 DIAGNOSIS — E11319 Type 2 diabetes mellitus with unspecified diabetic retinopathy without macular edema: Secondary | ICD-10-CM

## 2020-09-23 MED ORDER — TRULICITY 1.5 MG/0.5ML ~~LOC~~ SOAJ: 3.0000 mg | SUBCUTANEOUS | 1 refills | Status: DC

## 2020-09-23 NOTE — Telephone Encounter (Signed)
I spoke with patient & she was willing to increase the Trulicty & she will use up two of the 1.5mg  pens weekly. She has f/u scheduled with [place] 12/14.

## 2020-09-23 NOTE — Telephone Encounter (Signed)
Call pt  Please confirm these are fasting blood sugar ( I presume so) Please confirm she is compliant with : amaryl 4mg  qd, farxiga 10mg  qd, trulicity 1.5mg  qwk   Increase trulicity as FBG are not < 120.  I have sent in trulicity 3.0mg   Please ensure follow up in 2 months Advise patient that as fasting blood sugars approach 120 , I would like to discontinue amaryl. She may call with blood sugars again in a few weeks

## 2020-09-23 NOTE — Telephone Encounter (Signed)
Pt also confirmed she is taking glimepiride 4mg  & Farxiga 10mg  along with 1.5mg  of Trulicity.

## 2020-10-05 ENCOUNTER — Other Ambulatory Visit: Payer: Self-pay

## 2020-10-05 ENCOUNTER — Telehealth: Payer: Self-pay | Admitting: Family

## 2020-10-05 ENCOUNTER — Encounter: Payer: Self-pay | Admitting: Family

## 2020-10-05 ENCOUNTER — Ambulatory Visit (INDEPENDENT_AMBULATORY_CARE_PROVIDER_SITE_OTHER): Payer: No Typology Code available for payment source | Admitting: Family

## 2020-10-05 DIAGNOSIS — E1165 Type 2 diabetes mellitus with hyperglycemia: Secondary | ICD-10-CM

## 2020-10-05 DIAGNOSIS — G6289 Other specified polyneuropathies: Secondary | ICD-10-CM | POA: Diagnosis not present

## 2020-10-05 DIAGNOSIS — E119 Type 2 diabetes mellitus without complications: Secondary | ICD-10-CM | POA: Diagnosis not present

## 2020-10-05 DIAGNOSIS — I10 Essential (primary) hypertension: Secondary | ICD-10-CM | POA: Diagnosis not present

## 2020-10-05 DIAGNOSIS — E1140 Type 2 diabetes mellitus with diabetic neuropathy, unspecified: Secondary | ICD-10-CM | POA: Diagnosis not present

## 2020-10-05 DIAGNOSIS — IMO0002 Reserved for concepts with insufficient information to code with codable children: Secondary | ICD-10-CM

## 2020-10-05 DIAGNOSIS — M79605 Pain in left leg: Secondary | ICD-10-CM

## 2020-10-05 MED ORDER — PREGABALIN 50 MG PO CAPS: 50.0000 mg | ORAL_CAPSULE | Freq: Two times a day (BID) | ORAL | 1 refills | Status: DC

## 2020-10-05 NOTE — Patient Instructions (Addendum)
Fasting blood sugar goal less than 120, then we are at goal  STOP amaryl.   Increase lyrica to 50mg  twice per day  Please let me know when I order mammogram, colonoscopy

## 2020-10-05 NOTE — Assessment & Plan Note (Addendum)
Improved. Stop amaryl. Continue actos 15mg ,  farxiga 10mg , trulicity 3mg . Consider dc of actos as we gain control of DM

## 2020-10-05 NOTE — Assessment & Plan Note (Signed)
Controlled without medication. Discussed consideration of losartan. She declines today and would like to monitor renal function, proteinuria.

## 2020-10-05 NOTE — Progress Notes (Signed)
Subjective:    Patient ID: Brittany [name], female    DOB: 05-13-1956, 64 y.o.   MRN: [id number]  CC: Brittany [name] is a 64 y.o. female who presents today for follow up.   HPI: Feels well today No new complaints  DM- FGB 100.  Compliant with farxiga 10mg , amaryl 4 mg, actos 15mg . Increased trulicity 3mg  for last 2 weeks.  No hypoglycemia.    Peripheral neuropathy- compliant with lyrica 50mg  qhs which has been helpful for pain. Medication is not sedating. would like to increase. Compliant with cymbalta.   Leg pain- consult with Brittany [name] didn't reveal vascular origin. She reports rare low back pain. No saddle anesthesia, trouble urinating. No constipation.    Right  Thyroid nodule- Consult with Brittany [name] regarding thyroid nodule, started on methimazole.  HISTORY:  Past Medical History:  Diagnosis Date  . Diabetes mellitus without complication ([place])   . Hyperlipidemia   . Hypertension    pt states on medication as prevenative  . Obesity    Past Surgical History:  Procedure Laterality Date  . ABDOMINAL HYSTERECTOMY    . COLONOSCOPY WITH PROPOFOL N/A 06/25/2015   Procedure: COLONOSCOPY WITH PROPOFOL;  Surgeon: Brittany Lame, MD;  Location: [place];  Service: Endoscopy;  Laterality: N/A;  Diabetic - oral meds  . ELBOW SURGERY Left   . HIP SURGERY Right    twice   Family History  Problem Relation Age of Onset  . Diabetes Mother   . Cancer Father        unknown; '90 % cancer'  . Diabetes Sister   . Diabetes Brother   . Breast cancer Cousin        unknown age at dx.  . Thyroid cancer Neg Hx     Allergies: Jardiance [empagliflozin], Lisinopril, Atorvastatin, and Victoza [liraglutide] Current Outpatient Medications on File Prior to Visit  Medication Sig Dispense Refill  . Accu-Chek FastClix Lancets MISC Apply topically.    . Ascorbic Acid (VITAMIN C) 1000 MG tablet Take 2,000 mg by mouth daily.    . blood glucose meter kit and supplies Dispense based on  patient and insurance preference. Use to check blood sugars daily (FOR ICD-10 E10.9, E11.9). 1 each 0  . Blood Glucose Monitoring Suppl (ACCU-CHEK GUIDE) w/Device KIT 1 Device by Does not apply route daily as needed. 1 kit 0  . DULoxetine (CYMBALTA) 60 MG capsule TAKE 1 CAPSULE BY MOUTH EVERY DAY 90 capsule 1  . ELDERBERRY PO Take 1 tablet by mouth daily.    Marland Kitchen ezetimibe (ZETIA) 10 MG tablet TAKE 1 TABLET BY MOUTH EVERY DAY 90 tablet 3  . FARXIGA 10 MG TABS tablet TAKE 1 TABLET BY MOUTH DAILY BEFORE BREAKFAST 30 tablet 4  . glucose blood (ACCU-CHEK GUIDE) test strip Use as instructed 100 each 12  . meloxicam (MOBIC) 7.5 MG tablet TAKE 1 TABLET BY MOUTH DAILY AS NEEDED FOR PAIN 90 tablet 1  . methimazole (TAPAZOLE) 5 MG tablet Take 0.5 tablets by mouth daily.    Marland Kitchen omega-3 acid ethyl esters (LOVAZA) 1 g capsule Take 2 capsules (2 g total) by mouth 2 (two) times daily. 120 capsule 11  . omeprazole (PRILOSEC) 40 MG capsule TAKE 1 CAPSULE (40 MG TOTAL) BY MOUTH DAILY AS NEEDED. 90 capsule 1  . pioglitazone (ACTOS) 15 MG tablet Take by mouth.    . rosuvastatin (CRESTOR) 10 MG tablet Take 1 tablet (10 mg total) by mouth daily. Dose decrease. 90 tablet 1  .  TRULICITY 3 WR/6.0AV SOPN INJECT 1 PEN ($Remo'3MG'lyUfS$ ) INTO THE SKIN ONCE A WEEK 15 mL 1   No current facility-administered medications on file prior to visit.    Social History   Tobacco Use  . Smoking status: Never Smoker  . Smokeless tobacco: Never Used  Vaping Use  . Vaping Use: Never used  Substance Use Topics  . Alcohol use: Yes    Alcohol/week: 0.0 standard drinks    Comment: 1 times a year-wine  . Drug use: No    Review of Systems  Constitutional: Negative for chills and fever.  Respiratory: Negative for cough.   Cardiovascular: Negative for chest pain and palpitations.  Gastrointestinal: Negative for nausea and vomiting.      Objective:    BP 120/74   Pulse 100   Temp 98.3 F (36.8 C)   Ht 5' (1.524 m)   Wt 155 lb 12.8 oz (70.7  kg)   SpO2 98%   BMI 30.43 kg/m  BP Readings from Last 3 Encounters:  10/05/20 120/74  09/20/20 120/68  09/09/20 129/78   Wt Readings from Last 3 Encounters:  10/05/20 155 lb 12.8 oz (70.7 kg)  09/20/20 159 lb 8 oz (72.3 kg)  09/09/20 159 lb (72.1 kg)    Physical Exam Vitals reviewed.  Constitutional:      Appearance: She is well-developed and well-nourished.  Eyes:     Conjunctiva/sclera: Conjunctivae normal.  Cardiovascular:     Rate and Rhythm: Normal rate and regular rhythm.     Pulses: Normal pulses.     Heart sounds: Normal heart sounds.  Pulmonary:     Effort: Pulmonary effort is normal.     Breath sounds: Normal breath sounds. No wheezing, rhonchi or rales.  Skin:    General: Skin is warm and dry.  Neurological:     Mental Status: She is alert.  Psychiatric:        Mood and Affect: Mood and affect normal.        Speech: Speech normal.        Behavior: Behavior normal.        Thought Content: Thought content normal.        Assessment & Plan:   Problem List Items Addressed This Visit      Cardiovascular and Mediastinum   Essential hypertension    Controlled without medication. Discussed consideration of losartan. She declines today and would like to monitor renal function, proteinuria.         Endocrine   Type 2 diabetes, uncontrolled, with neuropathy (HCC)    Improved. Stop amaryl. Continue actos $RemoveBefore'15mg'QZtsoZAeYttQq$ ,  farxiga $Remove'10mg'OhJHSBQ$ , trulicity $RemoveBef'3mg'UIuGnYtUov$ . Consider dc of actos as we gain control of DM        Nervous and Auditory   Peripheral neuropathy    Improved. Will increase lyrica to $RemoveB'50mg'FggDnFvi$  bid. I looked up patient on [place] Controlled Substances Reporting System PMP AWARE and saw no activity that raised concern of inappropriate use.        Relevant Medications   pregabalin (LYRICA) 50 MG capsule     Other   Leg pain    Unlikely vascular origin after consult with vascular. Patient declines further evaluation including imaging of low back at this time. Continue  cymbalta $RemoveB'60mg'Lqrokxtp$ .       Other Visit Diagnoses    Type 2 diabetes mellitus without complication, without long-term current use of insulin (HCC)       Relevant Medications   pregabalin (LYRICA) 50 MG capsule  Note: she declines mammogram, colonoscopy at this time  I have discontinued Brindle Leyba. [place]'s glimepiride. I have also changed her pregabalin. Additionally, I am having her maintain her omega-3 acid ethyl esters, blood glucose meter kit and supplies, Accu-Chek Guide, Accu-Chek Guide, vitamin C, ELDERBERRY PO, rosuvastatin, omeprazole, Farxiga, meloxicam, DULoxetine, ezetimibe, Accu-Chek FastClix Lancets, pioglitazone, Trulicity, and methimazole.   Meds ordered this encounter  Medications  . pregabalin (LYRICA) 50 MG capsule    Sig: Take 1 capsule (50 mg total) by mouth 2 (two) times daily.    Dispense:  60 capsule    Refill:  1    Order Specific Question:   Supervising Provider    Answer:   Crecencio Mc [[id number]]    Return precautions given.   Risks, benefits, and alternatives of the medications and treatment plan prescribed today were discussed, and patient expressed understanding.   Education regarding symptom management and diagnosis given to patient on AVS.  Continue to follow with Burnard Hawthorne, FNP for routine health maintenance.   Linard Millers and I agreed with plan.   Mable Paris, FNP

## 2020-10-05 NOTE — Telephone Encounter (Signed)
I tried to call [facility] & phone only rings. Will try back.

## 2020-10-05 NOTE — Telephone Encounter (Signed)
Call [facility] endo Dennie Bible is on trulicity ( GLP 1) Is this concern being that she being followed for thyroid nodule?  Concerned in regards to black box warning of medullary thyroid cancer on GLP 1

## 2020-10-05 NOTE — Assessment & Plan Note (Signed)
Improved. Will increase lyrica to 50mg  bid. I looked up patient on [place] Controlled Substances Reporting System PMP AWARE and saw no activity that raised concern of inappropriate use.

## 2020-10-05 NOTE — Assessment & Plan Note (Signed)
Unlikely vascular origin after consult with vascular. Patient declines further evaluation including imaging of low back at this time. Continue cymbalta 60mg .

## 2020-10-07 ENCOUNTER — Other Ambulatory Visit: Payer: Self-pay | Admitting: Family

## 2020-10-07 DIAGNOSIS — IMO0002 Reserved for concepts with insufficient information to code with codable children: Secondary | ICD-10-CM

## 2020-10-07 DIAGNOSIS — E119 Type 2 diabetes mellitus without complications: Secondary | ICD-10-CM

## 2020-10-07 DIAGNOSIS — M25552 Pain in left hip: Secondary | ICD-10-CM

## 2020-10-07 NOTE — Telephone Encounter (Signed)
Brittany [name] form [facility] end[facility] called and said it is ok for patient to take Trulicity

## 2020-10-07 NOTE — Telephone Encounter (Signed)
I called over to [facility] endo & message was sent back to [facility] asking her to advise on patient okay to take Trulicity.

## 2020-10-08 NOTE — Telephone Encounter (Signed)
FYI

## 2020-11-15 ENCOUNTER — Other Ambulatory Visit: Payer: Self-pay | Admitting: Family

## 2020-11-29 ENCOUNTER — Telehealth: Payer: Self-pay | Admitting: Family

## 2020-11-29 DIAGNOSIS — E11319 Type 2 diabetes mellitus with unspecified diabetic retinopathy without macular edema: Secondary | ICD-10-CM

## 2020-11-29 MED ORDER — TRULICITY 4.5 MG/0.5ML ~~LOC~~ SOAJ: 4.5000 mg | SUBCUTANEOUS | 3 refills | Status: DC

## 2020-11-29 NOTE — Addendum Note (Signed)
Addended by: Allegra Grana on: 11/29/2020 03:15 PM   Modules accepted: Orders

## 2020-11-29 NOTE — Telephone Encounter (Signed)
Ever since changing medication Patient's sugars have been elevated.Was taken off the glimepiride. Pioglitazone is what Patient is currently on.  States her fasting sugars have been in the 200s. States she has also reduced her sugar intake.    States she was also started on a thyroid medication Methimazole.

## 2020-11-29 NOTE — Telephone Encounter (Signed)
When calling to inform the Patient about the below she states that she had went 2 days no medications at all other than the Trulicity.   States she was feeling jittery and hands were shaking. States this would get to a point of her dropping things.  Informed PCP and she wanted Patient scheduled for a visit. Patient scheduled for Monday 12/06/20.

## 2020-11-29 NOTE — Telephone Encounter (Signed)
Left message to return call 

## 2020-11-29 NOTE — Telephone Encounter (Signed)
Call pt I have increased trulicity to 4.5mg  qwk Please ensure she has a follow up with me

## 2020-11-29 NOTE — Telephone Encounter (Signed)
Pt called returning your call 

## 2020-11-29 NOTE — Telephone Encounter (Signed)
Call pt Confirm she is on farxiga 10mg , trulicity 3mg  Is she only on the pioglitazone ( brand name Actos) 15mg  right now?    I stopped AMARYL during last visit in December but didn't stop farxiga or trulicity

## 2020-11-29 NOTE — Telephone Encounter (Signed)
Patient confirmed taking the Trulicity 3mg , Farxiga 10 mg, and the Actos

## 2020-11-29 NOTE — Telephone Encounter (Signed)
Pt called and wanted a call back to discuss her sugars they have been running around 270

## 2020-11-30 ENCOUNTER — Telehealth (INDEPENDENT_AMBULATORY_CARE_PROVIDER_SITE_OTHER): Payer: No Typology Code available for payment source | Admitting: Family

## 2020-11-30 ENCOUNTER — Encounter: Payer: Self-pay | Admitting: Family

## 2020-11-30 VITALS — Ht 60.0 in

## 2020-11-30 DIAGNOSIS — E1165 Type 2 diabetes mellitus with hyperglycemia: Secondary | ICD-10-CM | POA: Diagnosis not present

## 2020-11-30 DIAGNOSIS — E11319 Type 2 diabetes mellitus with unspecified diabetic retinopathy without macular edema: Secondary | ICD-10-CM | POA: Diagnosis not present

## 2020-11-30 DIAGNOSIS — IMO0002 Reserved for concepts with insufficient information to code with codable children: Secondary | ICD-10-CM

## 2020-11-30 DIAGNOSIS — E1140 Type 2 diabetes mellitus with diabetic neuropathy, unspecified: Secondary | ICD-10-CM

## 2020-11-30 MED ORDER — TRULICITY 3 MG/0.5ML ~~LOC~~ SOAJ: 3.0000 mg | SUBCUTANEOUS | 3 refills | Status: DC

## 2020-11-30 MED ORDER — GLIMEPIRIDE 4 MG PO TABS
4.0000 mg | ORAL_TABLET | Freq: Every day | ORAL | 3 refills | Status: DC
Start: 2020-11-30 — End: 2021-12-12

## 2020-11-30 NOTE — Telephone Encounter (Signed)
Patient has been called and scheduled for Telephone call at 9:30am on 11/30/2920.

## 2020-11-30 NOTE — Telephone Encounter (Signed)
Call pt Work her in now for VV or telephone

## 2020-11-30 NOTE — Assessment & Plan Note (Signed)
Uncontrolled. Restart glimepiride 4mg  qam as well as ( she had held for 2 days) trulicity 3mg , actos 10mg  ,farxiga 15mg .She will keep follow up next week. She will call me if blood sugar > 200.

## 2020-11-30 NOTE — Telephone Encounter (Signed)
Pt called to report that her Blood Sugar was 497 this morning

## 2020-11-30 NOTE — Progress Notes (Signed)
Verbal consent for services obtained from patient prior to services given to TELEPHONE visit:   Location of call:  provider at work patient at home  Names of all persons present for services: [name], NP and patient Acute visit hyperglycemia.   Stopped glimepiride 4mg  qam 6 weeks ago. She took one dose of glimepiride this morning along with farxiga, actos. Last dose of trulicity 3mg .  Had had polyuria, jittery.  Blood sugar of 497 this morning. Prior to FBG 200's. She didn't take actos , trulicity, farxiga  For 2 days as she wasn't sure if that was causing her to feel jittery.  Eating regular meals. No hypoglycemic episodes.  No changes to eating or exercise. She is following DM diet.   She has started methimazole 5mg .         A/P/next steps:  Problem List Items Addressed This Visit      Endocrine   Diabetic retinopathy associated with type 2 diabetes mellitus (HCC) - Primary   Relevant Medications   glimepiride (AMARYL) 4 MG tablet   Dulaglutide (TRULICITY) 3 MG/0.5ML SOPN   Type 2 diabetes, uncontrolled, with neuropathy (HCC)    Uncontrolled. Restart glimepiride 4mg  qam as well as ( she had held for 2 days) trulicity 3mg , actos 10mg  ,farxiga 15mg .She will keep follow up next week. She will call me if blood sugar > 200.       Relevant Medications   glimepiride (AMARYL) 4 MG tablet   Dulaglutide (TRULICITY) 3 MG/0.5ML SOPN       I spent 15 min  discussing plan of care over the phone.

## 2020-11-30 NOTE — Telephone Encounter (Signed)
Error. Made telephone call for 9:30am on 11/30/2020

## 2020-12-06 ENCOUNTER — Other Ambulatory Visit: Payer: Self-pay

## 2020-12-06 ENCOUNTER — Encounter: Payer: Self-pay | Admitting: Family

## 2020-12-06 ENCOUNTER — Ambulatory Visit (INDEPENDENT_AMBULATORY_CARE_PROVIDER_SITE_OTHER): Payer: No Typology Code available for payment source | Admitting: Family

## 2020-12-06 VITALS — BP 110/70 | HR 90 | Temp 98.6°F | Ht 65.0 in | Wt 149.2 lb

## 2020-12-06 DIAGNOSIS — E1165 Type 2 diabetes mellitus with hyperglycemia: Secondary | ICD-10-CM

## 2020-12-06 DIAGNOSIS — R252 Cramp and spasm: Secondary | ICD-10-CM

## 2020-12-06 DIAGNOSIS — R21 Rash and other nonspecific skin eruption: Secondary | ICD-10-CM | POA: Diagnosis not present

## 2020-12-06 DIAGNOSIS — E11319 Type 2 diabetes mellitus with unspecified diabetic retinopathy without macular edema: Secondary | ICD-10-CM

## 2020-12-06 DIAGNOSIS — Z1231 Encounter for screening mammogram for malignant neoplasm of breast: Secondary | ICD-10-CM

## 2020-12-06 DIAGNOSIS — IMO0002 Reserved for concepts with insufficient information to code with codable children: Secondary | ICD-10-CM

## 2020-12-06 DIAGNOSIS — E785 Hyperlipidemia, unspecified: Secondary | ICD-10-CM

## 2020-12-06 DIAGNOSIS — E1140 Type 2 diabetes mellitus with diabetic neuropathy, unspecified: Secondary | ICD-10-CM

## 2020-12-06 DIAGNOSIS — Z1239 Encounter for other screening for malignant neoplasm of breast: Secondary | ICD-10-CM | POA: Insufficient documentation

## 2020-12-06 LAB — POCT GLYCOSYLATED HEMOGLOBIN (HGB A1C): Hemoglobin A1C: 9 % — AB (ref 4.0–5.6)

## 2020-12-06 MED ORDER — TRULICITY 4.5 MG/0.5ML ~~LOC~~ SOAJ: 4.5000 mg | SUBCUTANEOUS | 3 refills | Status: DC

## 2020-12-06 MED ORDER — MUPIROCIN 2 % EX OINT: 1.0000 "application " | TOPICAL_OINTMENT | Freq: Two times a day (BID) | CUTANEOUS | 0 refills | Status: DC

## 2020-12-06 NOTE — Assessment & Plan Note (Signed)
Hold crestor 10mg  for 2 weeks and see if cramping resolves.

## 2020-12-06 NOTE — Assessment & Plan Note (Signed)
Improving. Etiology nonspecific. Patient is non toxic appearance. Advised bactroban , topical antihistamine , oral antihistamine. She will let me know if doesn't completely resolve.

## 2020-12-06 NOTE — Progress Notes (Signed)
Subjective:    Patient ID: Brittany [name], female    DOB: 01/02/1956, 66 y.o.   MRN: [id number]  CC: Brittany [name] is a 65 y.o. female who presents today for follow up.   HPI:  Follow up hyperglycemia Prior [phone] earlier last week.   She is feeling much better today.  FBG 153, 213,152.  She is compliant with glimepiride $RemoveBeforeDE'4mg'mKAdgWSIVyKICnN$  qam, trulicity $RemoveBefore'3mg'aQFVQKmlDBYKo$ , actos $Remov'10mg'CfrFWk$  ,farxiga $RemoveB'15mg'LpDxMSWh$ .  No hypoglyemic episodes  Dizziness resolve. No vision changes.   HLD- compliant with crestor $RemoveBefo'10mg'YcXlOUJIjou$ .   Bilateral calf cramping for for years.  Numbness in bilateral feet well controlled. Only bothers her every now and then when standing for periods of time at work.  Compliant with lyrica $RemoveBef'50mg'VfoOVFPFsP$  bid.   She is not antihypertension medication.   Rash upper bilateral back x 4 weeks, improving. Itchy. Non draining. No fever.  Scabs. Hasnt tried any medication for this.   No new medications, lotions.     Declines  HISTORY:  Past Medical History:  Diagnosis Date  . Diabetes mellitus without complication ([place])   . Hyperlipidemia   . Hypertension    pt states on medication as prevenative  . Obesity    Past Surgical History:  Procedure Laterality Date  . ABDOMINAL HYSTERECTOMY    . COLONOSCOPY WITH PROPOFOL N/A 06/25/2015   Procedure: COLONOSCOPY WITH PROPOFOL;  Surgeon: Lucilla Lame, MD;  Location: [facility];  Service: Endoscopy;  Laterality: N/A;  Diabetic - oral meds  . ELBOW SURGERY Left   . HIP SURGERY Right    twice   Family History  Problem Relation Age of Onset  . Diabetes Mother   . Cancer Father        unknown; '90 % cancer'  . Diabetes Sister   . Diabetes Brother   . Breast cancer Cousin        unknown age at dx.  . Thyroid cancer Neg Hx     Allergies: Jardiance [empagliflozin], Lisinopril, Atorvastatin, and Victoza [liraglutide] Current Outpatient Medications on File Prior to Visit  Medication Sig Dispense Refill  . Accu-Chek FastClix Lancets MISC Apply topically.    .  Ascorbic Acid (VITAMIN C) 1000 MG tablet Take 2,000 mg by mouth daily.    . blood glucose meter kit and supplies Dispense based on patient and insurance preference. Use to check blood sugars daily (FOR ICD-10 E10.9, E11.9). 1 each 0  . Blood Glucose Monitoring Suppl (ACCU-CHEK GUIDE) w/Device KIT 1 Device by Does not apply route daily as needed. 1 kit 0  . DULoxetine (CYMBALTA) 60 MG capsule TAKE 1 CAPSULE BY MOUTH EVERY DAY 90 capsule 1  . ELDERBERRY PO Take 1 tablet by mouth daily.    Marland Kitchen ezetimibe (ZETIA) 10 MG tablet TAKE 1 TABLET BY MOUTH EVERY DAY 90 tablet 3  . FARXIGA 10 MG TABS tablet TAKE 1 TABLET BY MOUTH DAILY BEFORE BREAKFAST 30 tablet 4  . glimepiride (AMARYL) 4 MG tablet Take 1 tablet (4 mg total) by mouth daily before breakfast. 90 tablet 3  . glucose blood (ACCU-CHEK GUIDE) test strip Use as instructed 100 each 12  . meloxicam (MOBIC) 7.5 MG tablet TAKE 1 TABLET BY MOUTH DAILY AS NEEDED FOR PAIN 90 tablet 1  . methimazole (TAPAZOLE) 5 MG tablet Take 0.5 tablets by mouth daily.    Marland Kitchen omega-3 acid ethyl esters (LOVAZA) 1 g capsule Take 2 capsules (2 g total) by mouth 2 (two) times daily. 120 capsule 11  . omeprazole (PRILOSEC)  40 MG capsule TAKE 1 CAPSULE (40 MG TOTAL) BY MOUTH DAILY AS NEEDED. 90 capsule 1  . pioglitazone (ACTOS) 15 MG tablet Take by mouth.    . pregabalin (LYRICA) 50 MG capsule Take 1 capsule (50 mg total) by mouth 2 (two) times daily. 60 capsule 1  . rosuvastatin (CRESTOR) 10 MG tablet TAKE 1 TABLET (10 MG TOTAL) BY MOUTH DAILY. DOSE DECREASE. 90 tablet 1   No current facility-administered medications on file prior to visit.    Social History   Tobacco Use  . Smoking status: Never Smoker  . Smokeless tobacco: Never Used  Vaping Use  . Vaping Use: Never used  Substance Use Topics  . Alcohol use: Yes    Alcohol/week: 0.0 standard drinks    Comment: 1 times a year-wine  . Drug use: No    Review of Systems  Constitutional: Negative for chills and fever.   Respiratory: Negative for cough.   Cardiovascular: Negative for chest pain and palpitations.  Gastrointestinal: Negative for nausea and vomiting.  Musculoskeletal: Positive for myalgias. Neck pain: bilateral leg cramping.  Skin: Positive for rash.  Neurological: Positive for numbness (bilateral feet, controlled). Negative for dizziness (resolved).      Objective:    BP 110/70   Pulse 90   Temp 98.6 F (37 C) (Oral)   Ht 5\' 5"  (1.651 m)   Wt 149 lb 3.2 oz (67.7 kg)   SpO2 98%   BMI 24.83 kg/m  BP Readings from Last 3 Encounters:  12/06/20 110/70  10/05/20 120/74  09/20/20 120/68   Wt Readings from Last 3 Encounters:  12/06/20 149 lb 3.2 oz (67.7 kg)  10/05/20 155 lb 12.8 oz (70.7 kg)  09/20/20 159 lb 8 oz (72.3 kg)    Physical Exam Vitals reviewed.  Constitutional:      Appearance: She is well-developed and well-nourished.  Eyes:     Conjunctiva/sclera: Conjunctivae normal.  Cardiovascular:     Rate and Rhythm: Normal rate and regular rhythm.     Pulses: Normal pulses.     Heart sounds: Normal heart sounds.  Pulmonary:     Effort: Pulmonary effort is normal.     Breath sounds: Normal breath sounds. No wheezing, rhonchi or rales.  Skin:    General: Skin is warm and dry.          Comments: Scattered discrete scabs noted over thoracic back. No discharge, surrounding erythema, or purulent discharge.   Neurological:     Mental Status: She is alert.  Psychiatric:        Mood and Affect: Mood and affect normal.        Speech: Speech normal.        Behavior: Behavior normal.        Thought Content: Thought content normal.        Assessment & Plan:   Problem List Items Addressed This Visit      Endocrine   Diabetic retinopathy associated with type 2 diabetes mellitus ([place])    Lab Results  Component Value Date   HGBA1C 9.0 (A) 12/06/2020   Uncontrolled. Continue glimepiride 4mg  qam, actos 10mg  ,farxiga 15mg .increase trulicity to 4.5mg . close follow up. Plan  to discuss low dose ARB at follow up.       Relevant Medications   Dulaglutide (TRULICITY) 4.5 IW/5.8KD SOPN   Type 2 diabetes, uncontrolled, with neuropathy (HCC) - Primary   Relevant Medications   Dulaglutide (TRULICITY) 4.5 XI/3.3AS SOPN   Other Relevant Orders  POCT HgB A1C (Completed)     Musculoskeletal and Integument   Rash    Improving. Etiology nonspecific. Patient is non toxic appearance. Advised bactroban , topical antihistamine , oral antihistamine. She will let me know if doesn't completely resolve.       Relevant Medications   mupirocin ointment (BACTROBAN) 2 %     Other   Hyperlipidemia    Previously controlled. Will  Update at follow up. Continue crestor $RemoveBeforeDE'10mg'AJCAJXvnuPnWcEL$  , except for trial wash out as mentioned.       Leg cramping    Hold crestor $RemoveBefo'10mg'oftCyFMUBvp$  for 2 weeks and see if cramping resolves.       Screening for breast cancer   Relevant Orders   MM 3D SCREEN BREAST BILATERAL     Of note patient declines cologuard OR colonoscopy  I have discontinued Brittany [name] Trulicity. I am also having her start on mupirocin ointment and Trulicity. Additionally, I am having her maintain her omega-3 acid ethyl esters, blood glucose meter kit and supplies, Accu-Chek Guide, Accu-Chek Guide, vitamin C, ELDERBERRY PO, omeprazole, ezetimibe, Accu-Chek FastClix Lancets, pioglitazone, methimazole, pregabalin, meloxicam, DULoxetine, rosuvastatin, Farxiga, and glimepiride.   Meds ordered this encounter  Medications  . mupirocin ointment (BACTROBAN) 2 %    Sig: Apply 1 application topically 2 (two) times daily.    Dispense:  22 g    Refill:  0    Order Specific Question:   Supervising Provider    Answer:   Brittany [name], Brittany [name] [[id number]]  . Dulaglutide (TRULICITY) 4.5 DH/7.8XB SOPN    Sig: Inject 4.5 mg as directed once a week.    Dispense:  0.5 mL    Refill:  3    Order Specific Question:   Supervising Provider    Answer:   Brittany [name] [[id number]]    Return precautions given.   Risks,  benefits, and alternatives of the medications and treatment plan prescribed today were discussed, and patient expressed understanding.   Education regarding symptom management and diagnosis given to patient on AVS.  Continue to follow with Brittany Hawthorne, Brittany [name] for routine health maintenance.   Brittany [name] and I agreed with plan.   Brittany Paris, Brittany [name]

## 2020-12-06 NOTE — Assessment & Plan Note (Signed)
Previously controlled. Will  Update at follow up. Continue crestor 10mg  , except for trial wash out as mentioned.

## 2020-12-06 NOTE — Patient Instructions (Addendum)
Start bactroban ointment for rash  Try Claritin or benadryl to help control the itching. You may even try topical benadryl for itch relief.    Trial stop crestor for 2 weeks; if cramps do not resolve then return for follow up to discuss.

## 2020-12-06 NOTE — Assessment & Plan Note (Signed)
Lab Results  Component Value Date   HGBA1C 9.0 (A) 12/06/2020   Uncontrolled. Continue glimepiride 4mg  qam, actos 10mg  ,farxiga 15mg .increase trulicity to 4.5mg . close follow up. Plan to discuss low dose ARB at follow up.

## 2020-12-07 ENCOUNTER — Encounter: Payer: Self-pay | Admitting: Family

## 2020-12-21 ENCOUNTER — Telehealth: Payer: No Typology Code available for payment source | Admitting: Family

## 2021-01-03 ENCOUNTER — Other Ambulatory Visit: Payer: Self-pay | Admitting: Family

## 2021-01-03 DIAGNOSIS — K2100 Gastro-esophageal reflux disease with esophagitis, without bleeding: Secondary | ICD-10-CM

## 2021-01-04 ENCOUNTER — Ambulatory Visit: Payer: No Typology Code available for payment source | Admitting: Family

## 2021-01-12 ENCOUNTER — Other Ambulatory Visit: Payer: Self-pay | Admitting: Family

## 2021-01-12 DIAGNOSIS — E119 Type 2 diabetes mellitus without complications: Secondary | ICD-10-CM

## 2021-03-07 ENCOUNTER — Other Ambulatory Visit: Payer: Self-pay

## 2021-03-07 ENCOUNTER — Ambulatory Visit (INDEPENDENT_AMBULATORY_CARE_PROVIDER_SITE_OTHER): Payer: No Typology Code available for payment source | Admitting: Family

## 2021-03-07 ENCOUNTER — Encounter: Payer: Self-pay | Admitting: Family

## 2021-03-07 VITALS — BP 108/58 | HR 76 | Temp 97.9°F | Ht 65.0 in | Wt 152.2 lb

## 2021-03-07 DIAGNOSIS — M79642 Pain in left hand: Secondary | ICD-10-CM

## 2021-03-07 DIAGNOSIS — E785 Hyperlipidemia, unspecified: Secondary | ICD-10-CM

## 2021-03-07 DIAGNOSIS — E119 Type 2 diabetes mellitus without complications: Secondary | ICD-10-CM

## 2021-03-07 DIAGNOSIS — IMO0002 Reserved for concepts with insufficient information to code with codable children: Secondary | ICD-10-CM

## 2021-03-07 DIAGNOSIS — E1140 Type 2 diabetes mellitus with diabetic neuropathy, unspecified: Secondary | ICD-10-CM

## 2021-03-07 DIAGNOSIS — E041 Nontoxic single thyroid nodule: Secondary | ICD-10-CM

## 2021-03-07 DIAGNOSIS — E1165 Type 2 diabetes mellitus with hyperglycemia: Secondary | ICD-10-CM

## 2021-03-07 DIAGNOSIS — Z1211 Encounter for screening for malignant neoplasm of colon: Secondary | ICD-10-CM | POA: Diagnosis not present

## 2021-03-07 LAB — POCT GLYCOSYLATED HEMOGLOBIN (HGB A1C): Hemoglobin A1C: 6.8 % — AB (ref 4.0–5.6)

## 2021-03-07 MED ORDER — PREGABALIN 50 MG PO CAPS: 50.0000 mg | ORAL_CAPSULE | Freq: Three times a day (TID) | ORAL | 1 refills | Status: DC

## 2021-03-07 NOTE — Progress Notes (Signed)
Subjective:    Patient ID: Brittany [name], female    DOB: 04-13-1956, 65 y.o.   MRN: [id number]  CC: Brittany [name] is a 65 y.o. female who presents today for follow up.   HPI: Left 5th finger pain after direct fall x 6 weeks ago into the grass. Pain with flexion.   Swelling and bruising resolved. Icing has been helpful.   Taking mobic 7.$RemoveBefore'5mg'FXByDigmhOhBg$  qd for hip pain. Hip pain is not changed and not bothering her at this time.   No loc or head injury after fall.   DM-  FBG 140, 147. Rarely 90 as FBG.  compliant with glimepiride $RemoveBeforeDE'4mg'VgdBFrknRNfqAoW$  qam, actos $RemoveBe'10mg'lnlkgtSsE$  ,farxiga $RemoveB'15mg'xamZMvjl$ , trulicity to 4.$RemoveBefore'5mg'SHSEtSavkkMxS$ .   HLD- she is no longer on crestor $RemoveBe'10mg'pwzKRGErx$  as caused myalgia.She has tried lipitor, zocor with myalgia.   Compliant with zetia $RemoveBe'10mg'CbQhuOqoU$ .   DM neuropathy complicated by bilateral numbness and tingling in feet.- no low back pain, no saddle anesthesia. Compliant with lyrica $RemoveBef'50mg'FuzWbbFJNf$  bid with relief. No pain soles of feet.   b12 6 months ago 67  Consult with Dr Ladell Pier in regards to hypothyroidism, thyroid nodule.   01/30/21:  thyroid biopsy from 4/5 came back benign so no evidence of cancer  Due colonoscopy HISTORY:  Past Medical History:  Diagnosis Date  . Diabetes mellitus without complication ([place])   . Hyperlipidemia   . Hypertension    pt states on medication as prevenative  . Obesity    Past Surgical History:  Procedure Laterality Date  . ABDOMINAL HYSTERECTOMY    . COLONOSCOPY WITH PROPOFOL N/A 06/25/2015   Procedure: COLONOSCOPY WITH PROPOFOL;  Surgeon: Lucilla Lame, MD;  Location: [facility];  Service: Endoscopy;  Laterality: N/A;  Diabetic - oral meds  . ELBOW SURGERY Left   . HIP SURGERY Right    twice   Family History  Problem Relation Age of Onset  . Diabetes Mother   . Cancer Father        unknown; '90 % cancer'  . Diabetes Sister   . Diabetes Brother   . Breast cancer Cousin        unknown age at dx.  . Thyroid cancer Neg Hx     Allergies: Jardiance [empagliflozin], Lisinopril,  Atorvastatin, and Victoza [liraglutide] Current Outpatient Medications on File Prior to Visit  Medication Sig Dispense Refill  . Ascorbic Acid (VITAMIN C) 1000 MG tablet Take 2,000 mg by mouth daily.    . Dulaglutide (TRULICITY) 4.5 LT/9.0ZE SOPN Inject 4.5 mg as directed once a week. 0.5 mL 3  . DULoxetine (CYMBALTA) 60 MG capsule TAKE 1 CAPSULE BY MOUTH EVERY DAY 90 capsule 1  . ELDERBERRY PO Take 1 tablet by mouth daily.    Marland Kitchen ezetimibe (ZETIA) 10 MG tablet TAKE 1 TABLET BY MOUTH EVERY DAY 90 tablet 3  . FARXIGA 10 MG TABS tablet TAKE 1 TABLET BY MOUTH DAILY BEFORE BREAKFAST 30 tablet 4  . glimepiride (AMARYL) 4 MG tablet Take 1 tablet (4 mg total) by mouth daily before breakfast. 90 tablet 3  . meloxicam (MOBIC) 7.5 MG tablet TAKE 1 TABLET BY MOUTH DAILY AS NEEDED FOR PAIN 90 tablet 1  . methimazole (TAPAZOLE) 5 MG tablet Take 0.5 tablets by mouth daily.    . mupirocin ointment (BACTROBAN) 2 % Apply 1 application topically 2 (two) times daily. 22 g 0  . Omega-3 Fatty Acids (OMEGA-3 FISH OIL) 1200 MG CAPS Take 1 capsule by mouth daily.    Marland Kitchen omeprazole (PRILOSEC) 40  MG capsule TAKE 1 CAPSULE (40 MG TOTAL) BY MOUTH DAILY AS NEEDED. 90 capsule 1  . pioglitazone (ACTOS) 15 MG tablet Take by mouth.    . Accu-Chek FastClix Lancets MISC Apply topically. (Patient not taking: Reported on 03/07/2021)    . blood glucose meter kit and supplies Dispense based on patient and insurance preference. Use to check blood sugars daily (FOR ICD-10 E10.9, E11.9). (Patient not taking: Reported on 03/07/2021) 1 each 0  . Blood Glucose Monitoring Suppl (ACCU-CHEK GUIDE) w/Device KIT 1 Device by Does not apply route daily as needed. (Patient not taking: Reported on 03/07/2021) 1 kit 0  . glucose blood (ACCU-CHEK GUIDE) test strip Use as instructed (Patient not taking: Reported on 03/07/2021) 100 each 12   No current facility-administered medications on file prior to visit.    Social History   Tobacco Use  . Smoking  status: Never Smoker  . Smokeless tobacco: Never Used  Vaping Use  . Vaping Use: Never used  Substance Use Topics  . Alcohol use: Yes    Alcohol/week: 0.0 standard drinks    Comment: 1 times a year-wine  . Drug use: No    Review of Systems  Constitutional: Negative for chills and fever.  Respiratory: Negative for cough.   Cardiovascular: Negative for chest pain and palpitations.  Gastrointestinal: Negative for nausea and vomiting.  Musculoskeletal: Positive for arthralgias. Negative for joint swelling and myalgias.  Neurological: Positive for numbness.      Objective:    BP (!) 108/58 (BP Location: Left Arm, Patient Position: Sitting, Cuff Size: Large)   Pulse 76   Temp 97.9 F (36.6 C) (Oral)   Ht 5\' 5"  (1.651 m)   Wt 152 lb 3.2 oz (69 kg)   BMI 25.33 kg/m  BP Readings from Last 3 Encounters:  03/07/21 (!) 108/58  12/06/20 110/70  10/05/20 120/74   Wt Readings from Last 3 Encounters:  03/07/21 152 lb 3.2 oz (69 kg)  12/06/20 149 lb 3.2 oz (67.7 kg)  10/05/20 155 lb 12.8 oz (70.7 kg)    Physical Exam Vitals reviewed.  Constitutional:      Appearance: She is well-developed.  Eyes:     Conjunctiva/sclera: Conjunctivae normal.  Cardiovascular:     Rate and Rhythm: Normal rate and regular rhythm.     Pulses: Normal pulses.     Heart sounds: Normal heart sounds.  Pulmonary:     Effort: Pulmonary effort is normal.     Breath sounds: Normal breath sounds. No wheezing, rhonchi or rales.  Musculoskeletal:     Right wrist: Normal. No swelling or bony tenderness.     Left wrist: Normal. No swelling or bony tenderness.     Right hand: No swelling or bony tenderness. Normal range of motion. Normal strength. Normal sensation.     Left hand: Bony tenderness present. No swelling. Decreased range of motion. Normal strength. Normal sensation.     Comments: Pain over left 5th metacarpal PIP and MCP joint. No erythema, edema. Decreased ROM with flexion of left 5th metacarpal   Skin:    General: Skin is warm and dry.  Neurological:     Mental Status: She is alert.  Psychiatric:        Speech: Speech normal.        Behavior: Behavior normal.        Thought Content: Thought content normal.        Assessment & Plan:   Problem List Items Addressed This Visit  Endocrine   Thyroid nodule    Following with Dr Ladell Pier annually. Will follow       Type 2 diabetes, uncontrolled, with neuropathy ([place]) - Primary    Lab Results  Component Value Date   HGBA1C 6.8 (A) 03/07/2021   Improved. Continue glimepiride 4mg  qam, actos 10mg  ,farxiga 15mg , trulicity to 4.5mg . She is on low dose glimepiride and actos and advised trial stop of one of these as blood sugars appear to continue to improve. She declines at this time.  We agreed to increase lyrica to 50mg  TID to help control symptom.  I looked up patient on [place] Controlled Substances Reporting System PMP AWARE and saw no activity that raised concern of inappropriate use.        Relevant Orders   POCT HgB A1C (Completed)   Lipid panel   Comprehensive metabolic panel   Microalbumin / creatinine urine ratio     Other   Hyperlipidemia    Uncontrolled. Continue zetia 10mg . Consult with catie travis regarding consideration for PCSK9.       Relevant Orders   Lipid panel   AMB Referral to [facility]   Left hand pain    New. Failed conservative therapy.  Pending xr to look for gross pathology. If unrevealing, advised consult with Dr Peggye Ley. She declines at this time and will let me know.       Relevant Orders   DG Hand Complete Left    Other Visit Diagnoses    Screen for colon cancer       Relevant Orders   Ambulatory referral to Gastroenterology   Type 2 diabetes mellitus without complication, without long-term current use of insulin (HCC)       Relevant Medications   pregabalin (LYRICA) 50 MG capsule       I have discontinued Karsten Fells. Deschene's omega-3 acid ethyl esters and  rosuvastatin. I have also changed her pregabalin. Additionally, I am having her maintain her blood glucose meter kit and supplies, Accu-Chek Guide, Accu-Chek Guide, vitamin C, ELDERBERRY PO, ezetimibe, Accu-Chek FastClix Lancets, pioglitazone, methimazole, meloxicam, DULoxetine, Farxiga, glimepiride, mupirocin ointment, Trulicity, omeprazole, and Omega-3 Fish Oil.   Meds ordered this encounter  Medications  . pregabalin (LYRICA) 50 MG capsule    Sig: Take 1 capsule (50 mg total) by mouth 3 (three) times daily.    Dispense:  90 capsule    Refill:  1    Not to exceed 4 additional fills before 04/03/2021 DX Code: ICD-9-CM 357.2    Order Specific Question:   Supervising Provider    Answer:   Crecencio Mc [[id number]]    Return precautions given.   Risks, benefits, and alternatives of the medications and treatment plan prescribed today were discussed, and patient expressed understanding.   Education regarding symptom management and diagnosis given to patient on AVS.  Continue to follow with Burnard Hawthorne, FNP for routine health maintenance.   Linard Millers and I agreed with plan.   Mable Paris, FNP

## 2021-03-07 NOTE — Assessment & Plan Note (Signed)
Uncontrolled. Continue zetia 10mg . Consult with catie travis regarding consideration for PCSK9.

## 2021-03-07 NOTE — Patient Instructions (Addendum)
Referral for colonoscopy and to Pharmacist to discussed new drug class for cholesterol.   Let me know if hand pain doesn't improve as I would recommend consult with orthopedics  Increase lyrica 50mg  to three time daily.

## 2021-03-07 NOTE — Assessment & Plan Note (Signed)
Following with Dr Donald Prose annually. Will follow

## 2021-03-07 NOTE — Assessment & Plan Note (Addendum)
Lab Results  Component Value Date   HGBA1C 6.8 (A) 03/07/2021   Improved. Continue glimepiride 4mg  qam, actos 10mg  ,farxiga 15mg , trulicity to 4.5mg . She is on low dose glimepiride and actos and advised trial stop of one of these as blood sugars appear to continue to improve. She declines at this time.  We agreed to increase lyrica to 50mg  TID to help control symptom.  I looked up patient on [place] Controlled Substances Reporting System PMP AWARE and saw no activity that raised concern of inappropriate use.

## 2021-03-07 NOTE — Assessment & Plan Note (Signed)
New. Failed conservative therapy.  Pending xr to look for gross pathology. If unrevealing, advised consult with Dr Stephenie Acres. She declines at this time and will let me know.

## 2021-03-08 ENCOUNTER — Telehealth: Payer: Self-pay

## 2021-03-08 LAB — COMPREHENSIVE METABOLIC PANEL
ALT: 26 IU/L (ref 0–32)
AST: 19 IU/L (ref 0–40)
Albumin/Globulin Ratio: 2 (ref 1.2–2.2)
Albumin: 4.4 g/dL (ref 3.8–4.8)
Alkaline Phosphatase: 70 IU/L (ref 44–121)
BUN/Creatinine Ratio: 25 (ref 12–28)
BUN: 14 mg/dL (ref 8–27)
Bilirubin Total: 0.6 mg/dL (ref 0.0–1.2)
CO2: 21 mmol/L (ref 20–29)
Calcium: 9 mg/dL (ref 8.7–10.3)
Chloride: 105 mmol/L (ref 96–106)
Creatinine, Ser: 0.56 mg/dL — ABNORMAL LOW (ref 0.57–1.00)
Globulin, Total: 2.2 g/dL (ref 1.5–4.5)
Glucose: 128 mg/dL — ABNORMAL HIGH (ref 65–99)
Potassium: 4.2 mmol/L (ref 3.5–5.2)
Sodium: 141 mmol/L (ref 134–144)
Total Protein: 6.6 g/dL (ref 6.0–8.5)
eGFR: 102 mL/min/{1.73_m2} (ref >59)

## 2021-03-08 LAB — LIPID PANEL
Chol/HDL Ratio: 3.7 ratio (ref 0.0–4.4)
Cholesterol, Total: 199 mg/dL (ref 100–199)
HDL: 54 mg/dL (ref >39)
LDL Chol Calc (NIH): 131 mg/dL — ABNORMAL HIGH (ref 0–99)
Triglycerides: 75 mg/dL (ref 0–149)
VLDL Cholesterol Cal: 14 mg/dL (ref 5–40)

## 2021-03-08 LAB — MICROALBUMIN / CREATININE URINE RATIO
Creatinine, Urine: 105.8 mg/dL
Microalb/Creat Ratio: 6 mg/g creat (ref 0–29)
Microalbumin, Urine: 6.8 ug/mL

## 2021-03-08 NOTE — Chronic Care Management (AMB) (Signed)
  Care Management   Note  03/08/2021 Name: Brittany [name] MRN: [id number] DOB: 1956/09/03  Brittany [name] is a 65 y.o. year old female who is a primary care patient of Allegra Grana, FNP. I reached out to Chancy Milroy by phone today in response to a referral sent by [name]'s health plan.    [name] was given information about care management services today including:  1. Care management services include personalized support from designated clinical staff supervised by her physician, including individualized plan of care and coordination with other care providers 2. 24/7 contact phone numbers for assistance for urgent and routine care needs. 3. The patient may stop care management services at any time by phone call to the office staff.  Patient agreed to services and verbal consent obtained.   Follow up plan: Telephone appointment with care management team member scheduled for:03/18/2021  Penne Lash, RMA Care Guide, Embedded Care Coordination [facility]  [place], [place] [address] Direct Dial: [phone] [email or web address] Website: [email or web address]

## 2021-03-09 ENCOUNTER — Telehealth: Payer: Self-pay

## 2021-03-09 NOTE — Telephone Encounter (Signed)
PT called back to return phone call from earlier. 

## 2021-03-09 NOTE — Telephone Encounter (Signed)
LMTCB for labs. 

## 2021-03-09 NOTE — Telephone Encounter (Signed)
See result note.  

## 2021-03-11 ENCOUNTER — Telehealth: Payer: Self-pay | Admitting: Family

## 2021-03-11 NOTE — Telephone Encounter (Signed)
PT called in wanting to know if she can have a xray done on her foot as well since she is having one done for her finger.

## 2021-03-11 NOTE — Telephone Encounter (Addendum)
I called patient & she stated that the reason she had wanted xray was bc her left foot was hurting so bad & swollen. She said that this has been discussed in office before. She said that she is actually having to wear a larger size shoe on that foot. She that there was also some heat to the area. She said this has been happening off & on. The pain has just gotten bad & wanted a xray done. Given the warmth, swelling & pain I worried about a blood clot. She did not mention any redness & right foot is not swollen. She said that endo had told her the left foot swells due to the diabetes. She said that it has been  more swollen in the past & has gone down. I advised that pain was obviously bad enough to call about an xray that she needed to be evaluated. I advised [facility] or [facility] due to their capabilities & resources.  She said that she would go later this evening when she was off work or try tp leave when another employee got their at 2p. I told her & asked that she please keep [place] updated. I stressed the importance of going to be sure bc blood clots are so dangerous & could be life threatening.

## 2021-03-14 NOTE — Telephone Encounter (Signed)
Call pt Ensure she was evaluated at urgent care for swelling left foot

## 2021-03-15 ENCOUNTER — Other Ambulatory Visit: Payer: Self-pay

## 2021-03-15 ENCOUNTER — Ambulatory Visit
Admission: RE | Admit: 2021-03-15 | Discharge: 2021-03-15 | Disposition: A | Discharge status: Home / Self Care | Payer: No Typology Code available for payment source | Source: Ambulatory Visit | Attending: Family | Admitting: Family

## 2021-03-15 DIAGNOSIS — Z1231 Encounter for screening mammogram for malignant neoplasm of breast: Secondary | ICD-10-CM | POA: Diagnosis not present

## 2021-03-15 NOTE — Telephone Encounter (Signed)
Patient stated that she was evaluated for blood clot with Dr. Gilda Crease in November last year. She did not go to UC. She said that swelling is daily after being on feet all day. She said that left is just worse. Patient said swelling is better right now, but will bother again by end of the day after standing on concrete floors. She does not want to f/u any earlier & her next appointment is in August.

## 2021-03-15 NOTE — Telephone Encounter (Signed)
LMTCB

## 2021-03-15 NOTE — Telephone Encounter (Signed)
Noted  

## 2021-03-18 ENCOUNTER — Telehealth: Payer: Self-pay | Admitting: Pharmacist

## 2021-03-18 ENCOUNTER — Ambulatory Visit: Payer: No Typology Code available for payment source | Admitting: Pharmacist

## 2021-03-18 DIAGNOSIS — E1140 Type 2 diabetes mellitus with diabetic neuropathy, unspecified: Secondary | ICD-10-CM

## 2021-03-18 DIAGNOSIS — T466X5A Adverse effect of antihyperlipidemic and antiarteriosclerotic drugs, initial encounter: Secondary | ICD-10-CM

## 2021-03-18 DIAGNOSIS — IMO0002 Reserved for concepts with insufficient information to code with codable children: Secondary | ICD-10-CM

## 2021-03-18 DIAGNOSIS — E785 Hyperlipidemia, unspecified: Secondary | ICD-10-CM

## 2021-03-18 DIAGNOSIS — M791 Myalgia, unspecified site: Secondary | ICD-10-CM

## 2021-03-18 DIAGNOSIS — G6289 Other specified polyneuropathies: Secondary | ICD-10-CM

## 2021-03-18 NOTE — Chronic Care Management (AMB) (Addendum)
Care Management   Pharmacy Note  03/18/2021 Name: Brittany [name] MRN: [id number] DOB: 01/07/1956  Subjective: Brittany [name] is a 65 y.o. year old female who is a primary care patient of Arnett, [name] G, FNP. The Care Management team was consulted for assistance with care management and care coordination needs.    Engaged with patient by telephone for initial visit in response to provider referral for pharmacy case management and/or care coordination services.   The patient was given information about Care Management services today including:  1. Care Management services includes personalized support from designated clinical staff supervised by the patient's primary care provider, including individualized plan of care and coordination with other care providers. 2. 24/7 contact phone numbers for assistance for urgent and routine care needs. 3. The patient may stop case management services at any time by phone call to the office staff.  Patient agreed to services and consent obtained.  Assessment:  Review of patient status, including review of consultants reports, laboratory and other test data, was performed as part of comprehensive evaluation and provision of chronic care management services.   SDOH (Social Determinants of Health) assessments and interventions performed:  SDOH Interventions   Flowsheet Row Most Recent Value  SDOH Interventions   Financial Strain Interventions Intervention Not Indicated       Objective:  Lab Results  Component Value Date   CREATININE 0.56 (L) 03/07/2021   CREATININE 0.52 (L) 08/24/2020   CREATININE 0.61 12/24/2019    Lab Results  Component Value Date   HGBA1C 6.8 (A) 03/07/2021       Component Value Date/Time   CHOL 199 03/07/2021 0919   TRIG 75 03/07/2021 0919   HDL 54 03/07/2021 0919   CHOLHDL 3.7 03/07/2021 0919   CHOLHDL 3 05/09/2019 0826   VLDL 21.8 05/09/2019 0826   LDLCALC 131 (H) 03/07/2021 0919    Clinical ASCVD: No   The 10-year ASCVD risk score (Goff DC Jr., et al., 2013) is: 6.9%   Values used to calculate the score:     Age: 65 years     Sex: Female     Is Non-Hispanic African American: No     Diabetic: Yes     Tobacco smoker: No     Systolic Blood Pressure: 108 mmHg     Is BP treated: No     HDL Cholesterol: 54 mg/dL     Total Cholesterol: 199 mg/dL     BP Readings from Last 3 Encounters:  03/07/21 (!) 108/58  12/06/20 110/70  10/05/20 120/74    Care Plan  Allergies  Allergen Reactions  . Jardiance [Empagliflozin] Other (See Comments)    Muscle aches, blur vision   . Lisinopril     Throat irritation, cough  . Atorvastatin Other (See Comments)    myalgia  . Victoza [Liraglutide] Other (See Comments)    'knot on stomach.'    Medications Reviewed Today    Reviewed by Travis, Catherine E, RPH-CPP (Pharmacist) on 03/18/21 at 0951  Med List Status: <None>  Medication Order Taking? Sig Documenting Provider Last Dose Status Informant  Accu-Chek FastClix Lancets MISC [id number]  Apply topically.  Patient not taking: Reported on 03/07/2021   [provider]  Active   Ascorbic Acid (VITAMIN C) 1000 MG tablet [id number] Yes Take 2,000 mg by mouth daily. [provider] Taking Active   blood glucose meter kit and supplies [id number]  Dispense based on patient and insurance preference. Use to check blood sugars daily (  FOR ICD-10 E10.9, E11.9).  Patient not taking: Reported on 03/07/2021   Scott, Charlene, MD  Active   Blood Glucose Monitoring Suppl (ACCU-CHEK GUIDE) w/Device KIT [id number]  1 Device by Does not apply route daily as needed.  Patient not taking: Reported on 03/07/2021   Arnett, [name] G, FNP  Active   Dulaglutide (TRULICITY) 4.5 MG/0.5ML SOPN [id number] Yes Inject 4.5 mg as directed once a week. Arnett, [name] G, FNP Taking Active   DULoxetine (CYMBALTA) 60 MG capsule [id number] Yes TAKE 1 CAPSULE BY MOUTH EVERY DAY Arnett, [name] G, FNP Taking Active    ELDERBERRY PO [id number] Yes Take 1 tablet by mouth daily. [provider] Taking Active   ezetimibe (ZETIA) 10 MG tablet [id number] Yes TAKE 1 TABLET BY MOUTH EVERY DAY Arnett, [name] G, FNP Taking Active   FARXIGA 10 MG TABS tablet [id number] Yes TAKE 1 TABLET BY MOUTH DAILY BEFORE BREAKFAST Arnett, [name] G, FNP Taking Active   glimepiride (AMARYL) 4 MG tablet [id number] Yes Take 1 tablet (4 mg total) by mouth daily before breakfast. Arnett, [name] G, FNP Taking Active   glucose blood (ACCU-CHEK GUIDE) test strip [id number] No Use as instructed  Patient not taking: No sig reported   Arnett, [name] G, FNP Not Taking Active   meloxicam (MOBIC) 7.5 MG tablet [id number] Yes TAKE 1 TABLET BY MOUTH DAILY AS NEEDED FOR PAIN Arnett, [name] G, FNP Taking Active            Med Note (TRAVIS, CATHERINE E   Fri Mar 18, 2021  9:50 AM) ~2-3 times weekly  methimazole (TAPAZOLE) 5 MG tablet [id number] Yes Take 0.5 tablets by mouth daily. [provider] Taking Active   mupirocin ointment (BACTROBAN) 2 % [id number] No Apply 1 application topically 2 (two) times daily.  Patient not taking: Reported on 03/18/2021   Arnett, [name] G, FNP Not Taking Active   Omega-3 Fatty Acids (OMEGA-3 FISH OIL) 1200 MG CAPS [id number] Yes Take 1 capsule by mouth daily. [provider] Taking Active   omeprazole (PRILOSEC) 40 MG capsule [id number] Yes TAKE 1 CAPSULE (40 MG TOTAL) BY MOUTH DAILY AS NEEDED. Arnett, [name] G, FNP Taking Active   pioglitazone (ACTOS) 15 MG tablet [id number] Yes Take 15 mg by mouth daily. [provider] Taking Active   pregabalin (LYRICA) 50 MG capsule [id number] Yes Take 1 capsule (50 mg total) by mouth 3 (three) times daily. Arnett, [name] G, FNP Taking Active   Med List Note (Wright, Latoya S, CMA 01/10/17 1348): **[employer] EM[employer]OYEE**          Patient Active Problem List   Diagnosis Date Noted  . Left hand pain 03/07/2021  . Rash 12/06/2020  .  Screening for breast cancer 12/06/2020  . Lymphedema 09/08/2020  . Chronic venous insufficiency 09/08/2020  . Depression, recurrent (HCC) 08/25/2020  . Thyroid nodule 08/25/2020  . Leg cramping 12/24/2019  . Elevated TSH 05/07/2019  . Fatigue 12/02/2018  . Loss of balance 06/13/2018  . Myalgia 03/15/2018  . Leg pain 07/03/2017  . Routine physical examination 07/24/2016  . Hyperlipidemia   . Type 2 diabetes, uncontrolled, with neuropathy (HCC) 04/13/2016  . Vitamin D deficiency 04/13/2016  . Acquired trigger finger 12/02/2015  . Arthritis, degenerative 12/02/2015  . Peripheral neuropathy 12/02/2015  . GERD (gastroesophageal reflux disease) 12/02/2015  . Hx of colonic polyps   . Degenerative joint disease of hand 05/21/2015  . Colon polyps 05/21/2015  . Essential hypertension 05/20/2015  . Obesity, Class I, BMI 30-34.9   05/20/2015  . Diabetic retinopathy associated with type 2 diabetes mellitus ([place]) 02/23/2011  . Allergic rhinitis 11/07/2010    Conditions to be addressed/monitored: HLD, DMII and peripheral neuropathy  Care Plan : Medication Management  Updates made by De Hollingshead, RPH-CPP since 03/18/2021 12:00 AM    Problem: HLD, DM     Long-Range Goal: Disease Progression Prevention   Start Date: 03/18/2021  This Visit's Progress: On track  Priority: High  Note:   Current Barriers:  . Unable to achieve control of cholesterol   Pharmacist Clinical Goal(s):  Marland Kitchen Over the next 90 days, patient will achieve control of cholesterol through collaboration with PharmD and provider.   Interventions: . 1:1 collaboration with Burnard Hawthorne, FNP regarding development and update of comprehensive plan of care as evidenced by provider attestation and co-signature . Inter-disciplinary care team collaboration (see longitudinal plan of care) . Comprehensive medication review performed; medication list updated in electronic medical record  Hyperlipidemia: . Uncontrolled;  current treatment: ezetimibe 10 mg daily, OTC omega 3 fatty acids 1 g daily . Medications previously tried: rosuvastatin 10 mg daily, rosuvastatin 20 mg daily, atorvastatin 10 mg daily, atorvastatin 20 mg daily - all resulted in bilateral leg myalgias . Discussed role in therapy of PCSKi9. Discussed mechanism of action, side effects, benefits. Patient amenable to start. Per CoverMy Meds, Praluent preferred. Completed PA for Praluent 75 mg Q14 days. Will follow for result of PA.   Diabetes: . Controlled; current treatment: Trulicity 4.5 mg weekly, Farxiga 10 mg daily, pioglitazone 15 mg daily, glimepiride 4 mg daily o Hx metformin - unclear reason for discontinuation o Hx Victoza - stomach upset o Hx Jardiance - stomach upset . Encouraged by PCP to stop either pioglitazone or glimepiride previously, patient declined. Recommend to continue to follow.   Hyperthyroid/Thyroid Nodule: Marland Kitchen Managed by endocrinology; current regimen: methimazole 2.5 mg daily . Continue current regimen along with collaboration with endocrinology at this time  Peripheral Neuropathy: . Moderately well controlled per patient report; current regimen: pregabalin 50 mg TID, duloxetine 60 mg daily, meloxicam 7.5 mg PRN - using 2-3 times weekly . Reports of progressive leg swelling throughout the day. May be related to pregabalin therapy.  . Continue current regimen at this time. Notes she is contemplating retirement in December due to the physical stressor of her job.  GERD: . Moderately well controlled per patient report; current regimen: omeprazole 40 mg PRN  . Continue current regimen at this time  Supplements: Vitamin D, biotin, elderberry, cinnamon  Patient Goals/Self-Care Activities . Over the next 90 days, patient will:  - take medications as prescribed check glucose daily, document, and provide at future appointments collaborate with provider on medication access solutions  Follow Up Plan: Telephone follow up  appointment with care management team member scheduled for: ~ pending medication access plan       Medication Assistance:  None required.  Patient affirms current coverage meets needs.  Follow Up:  Patient agrees to Care Plan and Follow-up.  Plan: Telephone follow up appointment with care management team member scheduled for:  pending medication access plan  Catie Darnelle Maffucci, PharmD, BCACP, CPP Clinical Pharmacist [place] at [facility] [phone]  Encounter details: CCM Time Spent      Value Time User   Total time (minutes)  0 03/18/2021 10:27 AM De Hollingshead, RPH-CPP     Moderate to High Complex Decision Making    None     CCM Services: This encounter meets routine CCM  services.  Prior to outreach and patient consent for Chronic Care Management, I referred this patient for services after reviewing the nominated patient list or from a personal encounter with the patient.  I have personally reviewed this encounter including the documentation in this note and have collaborated with the care management provider regarding care management and care coordination activities to include development and update of the comprehensive care plan. I am certifying that I agree with the content of this note and encounter as supervising physician. [name] arnett, NP 

## 2021-03-18 NOTE — Patient Instructions (Signed)
Visit Information  PATIENT GOALS:  Goals Addressed              This Visit's Progress     Patient Stated   .  Medication Monitoring (pt-stated)        Patient Goals/Self-Care Activities . Over the next 90 days, patient will:  - take medications as prescribed check glucose daily, document, and provide at future appointments collaborate with provider on medication access solutions       Brittany [name] was given information about Care Management services by the embedded care coordination team including:  1. Care Management services include personalized support from designated clinical staff supervised by her physician, including individualized plan of care and coordination with other care providers 2. 24/7 contact phone numbers for assistance for urgent and routine care needs. 3. The patient may stop CCM services at any time (effective at the end of the month) by phone call to the office staff.  Patient agreed to services and verbal consent obtained.   Patient verbalizes understanding of instructions provided today and agrees to view in MyChart.   Plan: Telephone follow up appointment with care management team member scheduled for:  pending medication access plan  Catie Feliz Beam, PharmD, [place], CPP Clinical Pharmacist [employer] at [facility] [phone]

## 2021-03-18 NOTE — Telephone Encounter (Signed)
PA for Praluent 75 mg - Inject 75 mg every 14 days APPROVED 03/18/21-03/18/22.   Routing to PCP to send if in agreement.

## 2021-03-23 ENCOUNTER — Ambulatory Visit: Payer: No Typology Code available for payment source | Admitting: Pharmacist

## 2021-03-23 DIAGNOSIS — IMO0002 Reserved for concepts with insufficient information to code with codable children: Secondary | ICD-10-CM

## 2021-03-23 DIAGNOSIS — T466X5A Adverse effect of antihyperlipidemic and antiarteriosclerotic drugs, initial encounter: Secondary | ICD-10-CM

## 2021-03-23 DIAGNOSIS — E1140 Type 2 diabetes mellitus with diabetic neuropathy, unspecified: Secondary | ICD-10-CM

## 2021-03-23 DIAGNOSIS — E785 Hyperlipidemia, unspecified: Secondary | ICD-10-CM

## 2021-03-23 MED ORDER — PRALUENT 75 MG/ML ~~LOC~~ SOAJ: 75.0000 mg | SUBCUTANEOUS | 2 refills | Status: DC

## 2021-03-23 NOTE — Patient Instructions (Signed)
Visit Information  Goals Addressed              This Visit's Progress     Patient Stated   .  Medication Monitoring (pt-stated)        Patient Goals/Self-Care Activities . Over the next 90 days, patient will:  - take medications as prescribed check glucose daily, document, and provide at future appointments collaborate with provider on medication access solutions        Patient verbalizes understanding of instructions provided today and agrees to view in MyChart.  Plan: Telephone follow up appointment with care management team member scheduled for:  ~ 6 weeks  Catie Feliz Beam, PharmD, [place], CPP Clinical Pharmacist [employer] at [employer] [phone]

## 2021-03-23 NOTE — Telephone Encounter (Signed)
I agree praluent 75mg .  Thanks catie

## 2021-03-23 NOTE — Chronic Care Management (AMB) (Addendum)
Care Management   Pharmacy Note  03/23/2021 Name: Brittany [name] MRN: [id number] DOB: Dec 02, 1955  Subjective: Brittany [name] is a 65 y.o. year old female who is a primary care patient of Burnard Hawthorne, FNP. The Care Management team was consulted for assistance with care management and care coordination needs.    Engaged with patient by telephone for medication access follow up in response to provider referral for pharmacy case management and/or care coordination services.   The patient was given information about Care Management services today including:  1. Care Management services includes personalized support from designated clinical staff supervised by the patient's primary care provider, including individualized plan of care and coordination with other care providers. 2. 24/7 contact phone numbers for assistance for urgent and routine care needs. 3. The patient may stop case management services at any time by phone call to the office staff.  Patient agreed to services and consent obtained.  Assessment:  Review of patient status, including review of consultants reports, laboratory and other test data, was performed as part of comprehensive evaluation and provision of chronic care management services.   SDOH (Social Determinants of Health) assessments and interventions performed:  SDOH Interventions   Flowsheet Row Most Recent Value  SDOH Interventions   Financial Strain Interventions Other (Comment)  [discussed manufacturer assistance cards]       Objective:  Lab Results  Component Value Date   CREATININE 0.56 (L) 03/07/2021   CREATININE 0.52 (L) 08/24/2020   CREATININE 0.61 12/24/2019    Lab Results  Component Value Date   HGBA1C 6.8 (A) 03/07/2021       Component Value Date/Time   CHOL 199 03/07/2021 0919   TRIG 75 03/07/2021 0919   HDL 54 03/07/2021 0919   CHOLHDL 3.7 03/07/2021 0919   CHOLHDL 3 05/09/2019 0826   VLDL 21.8 05/09/2019 0826   LDLCALC 131 (H)  03/07/2021 0919    Clinical ASCVD: No  The 10-year ASCVD risk score Mikey Bussing DC Jr., et al., 2013) is: 6.9%   Values used to calculate the score:     Age: 65 years     Sex: Female     Is Non-Hispanic African American: No     Diabetic: Yes     Tobacco smoker: No     Systolic Blood Pressure: 378 mmHg     Is BP treated: No     HDL Cholesterol: 54 mg/dL     Total Cholesterol: 199 mg/dL     BP Readings from Last 3 Encounters:  03/07/21 (!) 108/58  12/06/20 110/70  10/05/20 120/74    Care Plan  Allergies  Allergen Reactions  . Jardiance [Empagliflozin] Other (See Comments)    Muscle aches, blur vision   . Lisinopril     Throat irritation, cough  . Atorvastatin Other (See Comments)    myalgia  . Victoza [Liraglutide] Other (See Comments)    'knot on stomach.'    Medications Reviewed Today    Reviewed by De Hollingshead, RPH-CPP (Pharmacist) on 03/18/21 at [phone]  Med List Status: <None>  Medication Order Taking? Sig Documenting Provider Last Dose Status Informant  Accu-Chek FastClix Lancets MISC [id number]  Apply topically.  Patient not taking: Reported on 03/07/2021   [provider]  Active   Ascorbic Acid (VITAMIN C) 1000 MG tablet [id number] Yes Take 2,000 mg by mouth daily. [provider] Taking Active   blood glucose meter kit and supplies [id number]  Dispense based on patient and insurance preference.  Use to check blood sugars daily (FOR ICD-10 E10.9, E11.9).  Patient not taking: Reported on 03/07/2021   Einar Pheasant, MD  Active   Blood Glucose Monitoring Suppl (ACCU-CHEK GUIDE) w/Device KIT [id number]  1 Device by Does not apply route daily as needed.  Patient not taking: Reported on 03/07/2021   Burnard Hawthorne, FNP  Active   Dulaglutide (TRULICITY) 4.5 FS/2.3TR [facility] [id number] Yes Inject 4.5 mg as directed once a week. Burnard Hawthorne, FNP Taking Active   DULoxetine (CYMBALTA) 60 MG capsule [id number] Yes TAKE 1 CAPSULE BY MOUTH EVERY DAY  Burnard Hawthorne, FNP Taking Active   ELDERBERRY PO [id number] Yes Take 1 tablet by mouth daily. [provider] Taking Active   ezetimibe (ZETIA) 10 MG tablet [id number] Yes TAKE 1 TABLET BY MOUTH EVERY DAY Burnard Hawthorne, FNP Taking Active   FARXIGA 10 MG TABS tablet [id number] Yes TAKE 1 TABLET BY MOUTH DAILY BEFORE BREAKFAST Burnard Hawthorne, FNP Taking Active   glimepiride (AMARYL) 4 MG tablet [id number] Yes Take 1 tablet (4 mg total) by mouth daily before breakfast. Burnard Hawthorne, FNP Taking Active   glucose blood (ACCU-CHEK GUIDE) test strip [id number] No Use as instructed  Patient not taking: No sig reported   Burnard Hawthorne, FNP Not Taking Active   meloxicam (MOBIC) 7.5 MG tablet [id number] Yes TAKE 1 TABLET BY MOUTH DAILY AS NEEDED FOR PAIN Burnard Hawthorne, FNP Taking Active            Med Note Darnelle Maffucci, Arville Lime   Fri Mar 18, 2021  9:50 AM) ~2-3 times weekly  methimazole (TAPAZOLE) 5 MG tablet [id number] Yes Take 0.5 tablets by mouth daily. [provider] Taking Active   mupirocin ointment (BACTROBAN) 2 % [id number] No Apply 1 application topically 2 (two) times daily.  Patient not taking: Reported on 03/18/2021   Burnard Hawthorne, FNP Not Taking Active   Omega-3 Fatty Acids (OMEGA-3 FISH OIL) 1200 MG CAPS [id number] Yes Take 1 capsule by mouth daily. [provider] Taking Active   omeprazole (PRILOSEC) 40 MG capsule [id number] Yes TAKE 1 CAPSULE (40 MG TOTAL) BY MOUTH DAILY AS NEEDED. Burnard Hawthorne, FNP Taking Active   pioglitazone (ACTOS) 15 MG tablet [id number] Yes Take 15 mg by mouth daily. [provider] Taking Active   pregabalin (LYRICA) 50 MG capsule [id number] Yes Take 1 capsule (50 mg total) by mouth 3 (three) times daily. Burnard Hawthorne, FNP Taking Active   Med List Note Leeanne Rio, [place] 01/10/17 1348): **[employer] EMPLOYEE**          Patient Active Problem List   Diagnosis Date Noted  . Left hand pain  03/07/2021  . Rash 12/06/2020  . Screening for breast cancer 12/06/2020  . Lymphedema 09/08/2020  . Chronic venous insufficiency 09/08/2020  . Depression, recurrent ([place]) 08/25/2020  . Thyroid nodule 08/25/2020  . Leg cramping 12/24/2019  . Elevated TSH 05/07/2019  . Fatigue 12/02/2018  . Loss of balance 06/13/2018  . Myalgia due to statin 03/15/2018  . Leg pain 07/03/2017  . Routine physical examination 07/24/2016  . Hyperlipidemia   . Type 2 diabetes, uncontrolled, with neuropathy ([place]) 04/13/2016  . Vitamin D deficiency 04/13/2016  . Acquired trigger finger 12/02/2015  . Arthritis, degenerative 12/02/2015  . Peripheral neuropathy 12/02/2015  . GERD (gastroesophageal reflux disease) 12/02/2015  . Hx of colonic polyps   . Degenerative joint disease of hand 05/21/2015  . Colon polyps 05/21/2015  . Essential  hypertension 05/20/2015  . Obesity, Class I, BMI 30-34.9 05/20/2015  . Diabetic retinopathy associated with type 2 diabetes mellitus ([place]) 02/23/2011  . Allergic rhinitis 11/07/2010    Conditions to be addressed/monitored: CAD, HTN, HLD and DMII  Care Plan : Medication Management  Updates made by De Hollingshead, RPH-CPP since 03/23/2021 12:00 AM    Problem: HLD, DM     Long-Range Goal: Disease Progression Prevention   Start Date: 03/18/2021  This Visit's Progress: On track  Recent Progress: On track  Priority: High  Note:   Current Barriers:  . Unable to achieve control of cholesterol   Pharmacist Clinical Goal(s):  Marland Kitchen Over the next 90 days, patient will achieve control of cholesterol through collaboration with PharmD and provider.   Interventions: . 1:1 collaboration with Burnard Hawthorne, FNP regarding development and update of comprehensive plan of care as evidenced by provider attestation and co-signature . Inter-disciplinary care team collaboration (see longitudinal plan of care) . Comprehensive medication review performed; medication list updated in  electronic medical record  Hyperlipidemia: . Uncontrolled; current treatment: ezetimibe 10 mg daily, OTC omega 3 fatty acids 1 g daily; Praluent 75 mg Q14 days prescribed . Medications previously tried: rosuvastatin 10 mg daily, rosuvastatin 20 mg daily, atorvastatin 10 mg daily, atorvastatin 20 mg daily - all resulted in bilateral leg myalgias . Per PCP, start Praluent 75 mg Q14 days. Advised patient to ask [facility] pharmacist (she works at [employer]) to demonstrate injection technique. If they cannot help, advised her to call me back and we can set up a time for administration teaching.  . F/u with lipid panel in ~ 4-12 weeks. PCP visit in August would be an appropriate time. If LDL not at goal, recommend increasing to Praluent 150 mg dose.   Diabetes: . Controlled; current treatment: Trulicity 4.5 mg weekly, Farxiga 10 mg daily, pioglitazone 15 mg daily, glimepiride 4 mg daily o Hx metformin - unclear reason for discontinuation o Hx Victoza - stomach upset o Hx Jardiance - stomach upset . Encouraged by PCP to stop either pioglitazone or glimepiride previously, patient declined. . Previously recommended to continue current regiment at this time.   Hyperthyroid/Thyroid Nodule: Marland Kitchen Managed by endocrinology; current regimen: methimazole 2.5 mg daily . Previously recommended to continue current regiment at this time.   Peripheral Neuropathy: . Moderately well controlled per patient report; current regimen: pregabalin 50 mg TID, duloxetine 60 mg daily, meloxicam 7.5 mg PRN - using 2-3 times weekly . Reports of progressive leg swelling throughout the day. May be related to pregabalin therapy.  . Previously recommended to continue current regiment at this time. Previously noted that she is contemplating retirement in December due to the physical stressor of her job.   GERD: . Moderately well controlled per patient report; current regimen: omeprazole 40 mg PRN  . Previously recommended to continue current  regiment at this time.   Supplements: Vitamin D, biotin, elderberry, cinnamon  Patient Goals/Self-Care Activities . Over the next 90 days, patient will:  - take medications as prescribed check glucose daily, document, and provide at future appointments collaborate with provider on medication access solutions  Follow Up Plan: Telephone follow up appointment with care management team member scheduled for: ~ 6 weeks      Medication Assistance:  None required.  Patient affirms current coverage meets needs.  Follow Up:  Patient agrees to Care Plan and Follow-up.  Plan: Telephone follow up appointment with care management team member scheduled for:  ~ 6 weeks  Catie Darnelle Maffucci, PharmD, [place], CPP Clinical Pharmacist [place] at [facility] [phone]  Encounter details: CCM Time Spent      Value Time User   Total time (minutes)  0 03/23/2021  1:36 PM De Hollingshead, RPH-CPP     Moderate to High Complex Decision Making    None     CCM Services: This encounter meets routine CCM services.  Prior to outreach and patient consent for Chronic Care Management, I referred this patient for services after reviewing the nominated patient list or from a personal encounter with the patient.  I have personally reviewed this encounter including the documentation in this note and have collaborated with the care management provider regarding care management and care coordination activities to include development and update of the comprehensive care plan. I am certifying that I agree with the content of this note and encounter as supervising physician. Mable Paris, NP

## 2021-04-07 ENCOUNTER — Other Ambulatory Visit: Payer: Self-pay | Admitting: Family

## 2021-04-08 ENCOUNTER — Other Ambulatory Visit: Payer: Self-pay | Admitting: Family

## 2021-04-08 DIAGNOSIS — E119 Type 2 diabetes mellitus without complications: Secondary | ICD-10-CM

## 2021-05-06 ENCOUNTER — Ambulatory Visit: Payer: No Typology Code available for payment source | Admitting: Pharmacist

## 2021-05-06 DIAGNOSIS — E1140 Type 2 diabetes mellitus with diabetic neuropathy, unspecified: Secondary | ICD-10-CM

## 2021-05-06 DIAGNOSIS — T466X5A Adverse effect of antihyperlipidemic and antiarteriosclerotic drugs, initial encounter: Secondary | ICD-10-CM

## 2021-05-06 DIAGNOSIS — IMO0002 Reserved for concepts with insufficient information to code with codable children: Secondary | ICD-10-CM

## 2021-05-06 DIAGNOSIS — E785 Hyperlipidemia, unspecified: Secondary | ICD-10-CM

## 2021-05-06 NOTE — Chronic Care Management (AMB) (Signed)
Care Management   Pharmacy Note  05/06/2021 Name: Brittany [name] MRN: [id number] DOB: 01-26-1956  Subjective: Brittany [name] is a 65 y.o. year old female who is a primary care patient of Burnard Hawthorne, FNP. The Care Management team was consulted for assistance with care management and care coordination needs.    Engaged with patient by telephone for follow up visit in response to provider referral for pharmacy case management and/or care coordination services.   The patient was given information about Care Management services today including:  Care Management services includes personalized support from designated clinical staff supervised by the patient's primary care provider, including individualized plan of care and coordination with other care providers. 24/7 contact phone numbers for assistance for urgent and routine care needs. The patient may stop case management services at any time by phone call to the office staff.  Patient agreed to services and consent obtained.  Assessment:  Review of patient status, including review of consultants reports, laboratory and other test data, was performed as part of comprehensive evaluation and provision of chronic care management services.   SDOH (Social Determinants of Health) assessments and interventions performed:    Objective:  Lab Results  Component Value Date   CREATININE 0.56 (L) 03/07/2021   CREATININE 0.52 (L) 08/24/2020   CREATININE 0.61 12/24/2019    Lab Results  Component Value Date   HGBA1C 6.8 (A) 03/07/2021       Component Value Date/Time   CHOL 199 03/07/2021 0919   TRIG 75 03/07/2021 0919   HDL 54 03/07/2021 0919   CHOLHDL 3.7 03/07/2021 0919   CHOLHDL 3 05/09/2019 0826   VLDL 21.8 05/09/2019 0826   LDLCALC 131 (H) 03/07/2021 0919     Clinical ASCVD: No  The 10-year ASCVD risk score Mikey Bussing DC Jr., et al., 2013) is: 6.9%   Values used to calculate the score:     Age: 80 years     Sex: Female     Is  Non-Hispanic African American: No     Diabetic: Yes     Tobacco smoker: No     Systolic Blood Pressure: 462 mmHg     Is BP treated: No     HDL Cholesterol: 54 mg/dL     Total Cholesterol: 199 mg/dL     BP Readings from Last 3 Encounters:  03/07/21 (!) 108/58  12/06/20 110/70  10/05/20 120/74    Care Plan  Allergies  Allergen Reactions   Jardiance [Empagliflozin] Other (See Comments)    Muscle aches, blur vision    Lisinopril     Throat irritation, cough   Atorvastatin Other (See Comments)    myalgia   Victoza [Liraglutide] Other (See Comments)    'knot on stomach.'    Medications Reviewed Today     Reviewed by De Hollingshead, RPH-CPP (Pharmacist) on 03/18/21 at [phone]  Med List Status: <None>   Medication Order Taking? Sig Documenting Provider Last Dose Status Informant  Accu-Chek FastClix Lancets MISC [id number]  Apply topically.  Patient not taking: Reported on 03/07/2021   [provider]  Active   Ascorbic Acid (VITAMIN C) 1000 MG tablet [id number] Yes Take 2,000 mg by mouth daily. [provider] Taking Active   blood glucose meter kit and supplies [id number]  Dispense based on patient and insurance preference. Use to check blood sugars daily (FOR ICD-10 E10.9, E11.9).  Patient not taking: Reported on 03/07/2021   Einar Pheasant, MD  Active   Blood Glucose Monitoring  Suppl (ACCU-CHEK GUIDE) w/Device KIT [id number]  1 Device by Does not apply route daily as needed.  Patient not taking: Reported on 03/07/2021   Burnard Hawthorne, FNP  Active   Dulaglutide (TRULICITY) 4.5 WU/9.8JX [facility] [id number] Yes Inject 4.5 mg as directed once a week. Burnard Hawthorne, FNP Taking Active   DULoxetine (CYMBALTA) 60 MG capsule [id number] Yes TAKE 1 CAPSULE BY MOUTH EVERY DAY Burnard Hawthorne, FNP Taking Active   ELDERBERRY PO [id number] Yes Take 1 tablet by mouth daily. [provider] Taking Active   ezetimibe (ZETIA) 10 MG tablet [id number] Yes TAKE 1  TABLET BY MOUTH EVERY DAY Burnard Hawthorne, FNP Taking Active   FARXIGA 10 MG TABS tablet [id number] Yes TAKE 1 TABLET BY MOUTH DAILY BEFORE BREAKFAST Burnard Hawthorne, FNP Taking Active   glimepiride (AMARYL) 4 MG tablet [id number] Yes Take 1 tablet (4 mg total) by mouth daily before breakfast. Burnard Hawthorne, FNP Taking Active   glucose blood (ACCU-CHEK GUIDE) test strip [id number] No Use as instructed  Patient not taking: No sig reported   Burnard Hawthorne, FNP Not Taking Active   meloxicam (MOBIC) 7.5 MG tablet [id number] Yes TAKE 1 TABLET BY MOUTH DAILY AS NEEDED FOR PAIN Burnard Hawthorne, FNP Taking Active            Med Note Darnelle Maffucci, Arville Lime   Fri Mar 18, 2021  9:50 AM) ~2-3 times weekly  methimazole (TAPAZOLE) 5 MG tablet [id number] Yes Take 0.5 tablets by mouth daily. [provider] Taking Active   mupirocin ointment (BACTROBAN) 2 % [id number] No Apply 1 application topically 2 (two) times daily.  Patient not taking: Reported on 03/18/2021   Burnard Hawthorne, FNP Not Taking Active   Omega-3 Fatty Acids (OMEGA-3 FISH OIL) 1200 MG CAPS [id number] Yes Take 1 capsule by mouth daily. [provider] Taking Active   omeprazole (PRILOSEC) 40 MG capsule [id number] Yes TAKE 1 CAPSULE (40 MG TOTAL) BY MOUTH DAILY AS NEEDED. Burnard Hawthorne, FNP Taking Active   pioglitazone (ACTOS) 15 MG tablet [id number] Yes Take 15 mg by mouth daily. [provider] Taking Active   pregabalin (LYRICA) 50 MG capsule [id number] Yes Take 1 capsule (50 mg total) by mouth 3 (three) times daily. Burnard Hawthorne, FNP Taking Active   Med List Note Leeanne Rio, [place] 01/10/17 1348): **[employer] EMPLOYEE**            Patient Active Problem List   Diagnosis Date Noted   Left hand pain 03/07/2021   Rash 12/06/2020   Screening for breast cancer 12/06/2020   Lymphedema 09/08/2020   Chronic venous insufficiency 09/08/2020   Depression, recurrent ([place]) 08/25/2020   Thyroid  nodule 08/25/2020   Leg cramping 12/24/2019   Elevated TSH 05/07/2019   Fatigue 12/02/2018   Loss of balance 06/13/2018   Myalgia due to statin 03/15/2018   Leg pain 07/03/2017   Routine physical examination 07/24/2016   Hyperlipidemia    Type 2 diabetes, uncontrolled, with neuropathy ([place]) 04/13/2016   Vitamin D deficiency 04/13/2016   Acquired trigger finger 12/02/2015   Arthritis, degenerative 12/02/2015   Peripheral neuropathy 12/02/2015   GERD (gastroesophageal reflux disease) 12/02/2015   Hx of colonic polyps    Degenerative joint disease of hand 05/21/2015   Colon polyps 05/21/2015   Essential hypertension 05/20/2015   Obesity, Class I, BMI 30-34.9 05/20/2015   Diabetic retinopathy associated with type 2 diabetes mellitus ([place]) 02/23/2011   Allergic rhinitis 11/07/2010  Conditions to be addressed/monitored: HLD and DMII  Care Plan : Medication Management  Updates made by De Hollingshead, RPH-CPP since 05/06/2021 12:00 AM     Problem: HLD, DM      Long-Range Goal: Disease Progression Prevention   Start Date: 03/18/2021  This Visit's Progress: On track  Recent Progress: On track  Priority: High  Note:   Current Barriers:  Unable to achieve control of cholesterol   Pharmacist Clinical Goal(s):  Over the next 90 days, patient will achieve control of cholesterol through collaboration with PharmD and provider.   Interventions: 1:1 collaboration with Burnard Hawthorne, FNP regarding development and update of comprehensive plan of care as evidenced by provider attestation and co-signature Inter-disciplinary care team collaboration (see longitudinal plan of care) Comprehensive medication review performed; medication list updated in electronic medical record  Hyperlipidemia: Uncontrolled; current treatment: ezetimibe 10 mg daily, OTC omega 3 fatty acids 1 g daily; Praluent 75 mg Q14 days prescribed Medications previously tried: rosuvastatin 10 mg daily,  rosuvastatin 20 mg daily, atorvastatin 10 mg daily, atorvastatin 20 mg daily - all resulted in bilateral leg myalgias Reports that she took 1 dose of Praluent and had no concerns, then took a second dose and her legs hurt. In retrospect through, she had stopped meloxicam around the same time and wonders if that related. Discussed that it would be appropriate to re-try Praluent to evaluate tolerability. She will do this and follow up with PCP next month.   Diabetes: Controlled; current treatment: Trulicity 4.5 mg weekly, Farxiga 10 mg daily, pioglitazone 15 mg daily, glimepiride 4 mg daily Hx metformin - unclear reason for discontinuation Hx Victoza - stomach upset Hx Jardiance - stomach upset Encouraged by PCP to stop either pioglitazone or glimepiride previously, patient declined. Previously recommended to continue current regiment at this time.   Hyperthyroid/Thyroid Nodule: Managed by endocrinology; current regimen: methimazole 2.5 mg daily Previously recommended to continue current regiment at this time.   Peripheral Neuropathy: Moderately well controlled per patient report; current regimen: pregabalin 50 mg TID, duloxetine 60 mg daily, meloxicam 7.5 mg PRN Previously recommended to continue current regiment at this time.   GERD: Moderately well controlled per patient report; current regimen: omeprazole 40 mg PRN  Previously recommended to continue current regiment at this time.   Supplements: Vitamin D, biotin, elderberry, cinnamon  Patient Goals/Self-Care Activities Over the next 90 days, patient will:  - take medications as prescribed check glucose daily, document, and provide at future appointments collaborate with provider on medication access solutions  Follow Up Plan: Telephone follow up appointment with care management team member scheduled for: ~ 10 weeks      Medication Assistance:  None required.  Patient affirms current coverage meets needs.  Follow Up:  Patient  agrees to Care Plan and Follow-up.  Plan: Telephone follow up appointment with care management team member scheduled for:  ~ 10 weeks  Catie Darnelle Maffucci, PharmD, [place], [place] Clinical Pharmacist [employer] at [employer] [phone]

## 2021-05-06 NOTE — Patient Instructions (Signed)
Visit Information   Goals Addressed               This Visit's Progress     Patient Stated     Medication Monitoring (pt-stated)        Patient Goals/Self-Care Activities Over the next 90 days, patient will:  - take medications as prescribed check glucose daily, document, and provide at future appointments collaborate with provider on medication access solutions        Patient verbalizes understanding of instructions provided today and agrees to view in MyChart.    Plan: Telephone follow up appointment with care management team member scheduled for:  ~ 10 weeks  Catie Feliz Beam, PharmD, [place], CPP Clinical Pharmacist [employer] at [employer] [phone]

## 2021-06-07 ENCOUNTER — Ambulatory Visit: Payer: No Typology Code available for payment source | Admitting: Family

## 2021-06-07 ENCOUNTER — Other Ambulatory Visit: Payer: Self-pay

## 2021-06-07 ENCOUNTER — Encounter: Payer: Self-pay | Admitting: Family

## 2021-06-07 VITALS — BP 120/76 | HR 75 | Temp 96.3°F | Ht 65.0 in | Wt 153.6 lb

## 2021-06-07 DIAGNOSIS — I10 Essential (primary) hypertension: Secondary | ICD-10-CM | POA: Diagnosis not present

## 2021-06-07 DIAGNOSIS — E119 Type 2 diabetes mellitus without complications: Secondary | ICD-10-CM

## 2021-06-07 DIAGNOSIS — IMO0002 Reserved for concepts with insufficient information to code with codable children: Secondary | ICD-10-CM

## 2021-06-07 DIAGNOSIS — G6289 Other specified polyneuropathies: Secondary | ICD-10-CM | POA: Diagnosis not present

## 2021-06-07 DIAGNOSIS — E1140 Type 2 diabetes mellitus with diabetic neuropathy, unspecified: Secondary | ICD-10-CM | POA: Diagnosis not present

## 2021-06-07 DIAGNOSIS — E1165 Type 2 diabetes mellitus with hyperglycemia: Secondary | ICD-10-CM

## 2021-06-07 DIAGNOSIS — E785 Hyperlipidemia, unspecified: Secondary | ICD-10-CM

## 2021-06-07 LAB — POCT GLYCOSYLATED HEMOGLOBIN (HGB A1C): Hemoglobin A1C: 6.7 % — AB (ref 4.0–5.6)

## 2021-06-07 MED ORDER — PREGABALIN 50 MG PO CAPS: ORAL_CAPSULE | ORAL | 2 refills | Status: DC

## 2021-06-07 NOTE — Assessment & Plan Note (Signed)
Controlled without medication.  We will continue to discuss ARB, ACE inhibitor with patient in setting of diabetes.  Currently no proteinuria ( 03/07/21), will continue to monitor

## 2021-06-07 NOTE — Assessment & Plan Note (Signed)
Suboptimal control.  Agreed to increase Lyrica to a total daily dose of 200 mg.  Continue duloxetine 60 mg, mobic7.5 mg as needed. I looked up patient on [place] Controlled Substances Reporting System PMP AWARE and saw no activity that raised concern of inappropriate use.

## 2021-06-07 NOTE — Assessment & Plan Note (Signed)
Lab Results  Component Value Date   HGBA1C 6.7 (A) 06/07/2021   Well-controlled, continue Trulicity 4.5 mg weekly, Farxiga 10 mg daily, pioglitazone 15 mg daily,glimepiride 4 mg daily.

## 2021-06-07 NOTE — Assessment & Plan Note (Signed)
Anticipate improved and will check lipid panel at follow-up.  Tolerating regimen well.  Continue Zetia 10 mg, Praluent 75 mg q. 14 days.

## 2021-06-07 NOTE — Progress Notes (Signed)
Subjective:    Patient ID: Brittany [name], female    DOB: 10-30-1955, 65 y.o.   MRN: [id number]  CC: Brittany [name] is a 65 y.o. female who presents today for follow up.   HPI: Feels well today. No new complaints.   hyperlipidemia-compliant with Zetia 10 mg, Praluent 75 mg q. 14 days.  No myalgias.   DM-compliant with Trulicity 4.5 mg weekly, Farxiga 10 mg daily, pioglitazone 15 mg daily,glimepiride 4 mg daily.  Peripheral neuropathy-compliant with pregabalin 50 mg 3 times daily, duloxetine 60 mg daily, meloxicam 7.5 mg as needed. She continues to have breakthrough neuropathy.  Currently Lyrica dosing does not seem as helpful as it had been in the past  Declines colonoscopy at this time.   HISTORY:  Past Medical History:  Diagnosis Date   Diabetes mellitus without complication ([place])    Hyperlipidemia    Hypertension    pt states on medication as prevenative   Obesity    Past Surgical History:  Procedure Laterality Date   ABDOMINAL HYSTERECTOMY     COLONOSCOPY WITH PROPOFOL N/A 06/25/2015   Procedure: COLONOSCOPY WITH PROPOFOL;  Surgeon: Lucilla Lame, MD;  Location: [facility];  Service: Endoscopy;  Laterality: N/A;  Diabetic - oral meds   ELBOW SURGERY Left    HIP SURGERY Right    twice   Family History  Problem Relation Age of Onset   Diabetes Mother    Cancer Father        unknown; '90 % cancer'   Diabetes Sister    Diabetes Brother    Breast cancer Cousin        unknown age at dx.   Thyroid cancer Neg Hx     Allergies: Jardiance [empagliflozin], Lisinopril, Atorvastatin, and Victoza [liraglutide] Current Outpatient Medications on File Prior to Visit  Medication Sig Dispense Refill   Accu-Chek FastClix Lancets MISC Apply topically.     Alirocumab (PRALUENT) 75 MG/ML SOAJ Inject 75 mg into the skin every 14 (fourteen) days. 2 mL 2   Ascorbic Acid (VITAMIN C) 1000 MG tablet Take 2,000 mg by mouth daily.     BIOTIN MAXIMUM PO Take 1 tablet by mouth daily.      blood glucose meter kit and supplies Dispense based on patient and insurance preference. Use to check blood sugars daily (FOR ICD-10 E10.9, E11.9). 1 each 0   Blood Glucose Monitoring Suppl (ACCU-CHEK GUIDE) w/Device KIT 1 Device by Does not apply route daily as needed. 1 kit 0   Dulaglutide (TRULICITY) 4.5 AN/1.9TY SOPN Inject 4.5 mg as directed once a week. 0.5 mL 3   DULoxetine (CYMBALTA) 60 MG capsule TAKE 1 CAPSULE BY MOUTH EVERY DAY 90 capsule 1   ELDERBERRY PO Take 1 tablet by mouth daily.     ezetimibe (ZETIA) 10 MG tablet TAKE 1 TABLET BY MOUTH EVERY DAY 90 tablet 3   FARXIGA 10 MG TABS tablet TAKE 1 TABLET BY MOUTH EVERY DAY IN THE MORNING BEFORE BREAKFAST 30 tablet 4   glimepiride (AMARYL) 4 MG tablet Take 1 tablet (4 mg total) by mouth daily before breakfast. 90 tablet 3   glucose blood (ACCU-CHEK GUIDE) test strip Use as instructed 100 each 12   meloxicam (MOBIC) 7.5 MG tablet TAKE 1 TABLET BY MOUTH DAILY AS NEEDED FOR PAIN 90 tablet 1   methimazole (TAPAZOLE) 5 MG tablet Take 0.5 tablets by mouth daily.     Omega-3 Fatty Acids (OMEGA-3 FISH OIL) 1200 MG CAPS Take 1 capsule  by mouth daily.     omeprazole (PRILOSEC) 40 MG capsule TAKE 1 CAPSULE (40 MG TOTAL) BY MOUTH DAILY AS NEEDED. 90 capsule 1   pioglitazone (ACTOS) 15 MG tablet Take 15 mg by mouth daily.     No current facility-administered medications on file prior to visit.    Social History   Tobacco Use   Smoking status: Never   Smokeless tobacco: Never  Vaping Use   Vaping Use: Never used  Substance Use Topics   Alcohol use: Yes    Alcohol/week: 0.0 standard drinks    Comment: 1 times a year-wine   Drug use: No    Review of Systems  Constitutional:  Negative for chills and fever.  Respiratory:  Negative for cough.   Cardiovascular:  Negative for chest pain and palpitations.  Gastrointestinal:  Negative for nausea and vomiting.  Neurological:  Positive for numbness (peripheral neuropathy).     Objective:     BP 120/76 (BP Location: Left Arm, Patient Position: Sitting, Cuff Size: Normal)   Pulse 75   Temp (!) 96.3 F (35.7 C) (Temporal)   Ht _0  (1.651 m)   Wt 153 lb 9.6 oz (69.7 kg)   SpO2 97%   BMI 25.56 kg/m  BP Readings from Last 3 Encounters:  06/07/21 120/76  03/07/21 (!) 108/58  12/06/20 110/70   Wt Readings from Last 3 Encounters:  06/07/21 153 lb 9.6 oz (69.7 kg)  03/07/21 152 lb 3.2 oz (69 kg)  12/06/20 149 lb 3.2 oz (67.7 kg)    Physical Exam Vitals reviewed.  Constitutional:      Appearance: She is well-developed.  Eyes:     Conjunctiva/sclera: Conjunctivae normal.  Cardiovascular:     Rate and Rhythm: Normal rate and regular rhythm.     Pulses: Normal pulses.     Heart sounds: Normal heart sounds.  Pulmonary:     Effort: Pulmonary effort is normal.     Breath sounds: Normal breath sounds. No wheezing, rhonchi or rales.  Skin:    General: Skin is warm and dry.  Neurological:     Mental Status: She is alert.  Psychiatric:        Speech: Speech normal.        Behavior: Behavior normal.        Thought Content: Thought content normal.       Assessment & Plan:   Problem List Items Addressed This Visit       Cardiovascular and Mediastinum   Essential hypertension    Controlled without medication.  We will continue to discuss ARB, ACE inhibitor with patient in setting of diabetes.  Currently no proteinuria ( 03/07/21), will continue to monitor        Endocrine   Type 2 diabetes, uncontrolled, with neuropathy ([place]) - Primary    Lab Results  Component Value Date   HGBA1C 6.7 (A) 06/07/2021  Well-controlled, continue Trulicity 4.5 mg weekly, Farxiga 10 mg daily, pioglitazone 15 mg daily,glimepiride 4 mg daily.      Relevant Orders   POCT HgB A1C (Completed)     Nervous and Auditory   Peripheral neuropathy    Suboptimal control.  Agreed to increase Lyrica to a total daily dose of 200 mg.  Continue duloxetine 60 mg, mobic7.5 mg as needed. I looked  up patient on [place] Controlled Substances Reporting System PMP AWARE and saw no activity that raised concern of inappropriate use.        Relevant Medications   pregabalin (  LYRICA) 50 MG capsule     Other   Hyperlipidemia    Anticipate improved and will check lipid panel at follow-up.  Tolerating regimen well.  Continue Zetia 10 mg, Praluent 75 mg q. 14 days.      Other Visit Diagnoses     Type 2 diabetes mellitus without complication, without long-term current use of insulin (HCC)       Relevant Medications   pregabalin (LYRICA) 50 MG capsule        I have discontinued Karsten Fells. Lappe's mupirocin ointment and CINNAMON PO. I have also changed her pregabalin. Additionally, I am having her maintain her blood glucose meter kit and supplies, Accu-Chek Guide, Accu-Chek Guide, vitamin C, ELDERBERRY PO, ezetimibe, Accu-Chek FastClix Lancets, pioglitazone, methimazole, meloxicam, DULoxetine, glimepiride, Trulicity, omeprazole, Omega-3 Fish Oil, BIOTIN MAXIMUM PO, Praluent, and [place].   Meds ordered this encounter  Medications   pregabalin (LYRICA) 50 MG capsule    Sig: Take 33m qam , take 551mmidday, and take 1003mhs.    Dispense:  120 capsule    Refill:  2    Not to exceed 4 additional fills before 04/03/2021 DX Code: ICD-9-CM 357.2    Order Specific Question:   Supervising Provider    Answer:   TULCrecencio [id number]]    Return precautions given.   Risks, benefits, and alternatives of the medications and treatment plan prescribed today were discussed, and patient expressed understanding.   Education regarding symptom management and diagnosis given to patient on AVS.  Continue to follow with ArnBurnard HawthorneNP for routine health maintenance.   KarLinard Millersd I agreed with plan.   MarMable ParisNP

## 2021-06-07 NOTE — Patient Instructions (Signed)
We will trial increasing Lyrica to the following:Take 50mg  in the morning , take 50mg  midday, and take 100mg  in the evening.  Let me know of any new concerns

## 2021-06-13 ENCOUNTER — Telehealth: Payer: Self-pay | Admitting: Family

## 2021-06-13 DIAGNOSIS — E119 Type 2 diabetes mellitus without complications: Secondary | ICD-10-CM

## 2021-06-13 DIAGNOSIS — G6289 Other specified polyneuropathies: Secondary | ICD-10-CM

## 2021-06-13 NOTE — Telephone Encounter (Signed)
PT called to advise that she is still having numbness in both her legs with it mainly staying in her left foot. PT also had it in her left hand where it is in pain and swelling. She had had this for awhile and Jason Coop is aware and she would like something for it and some advise.

## 2021-06-14 NOTE — Telephone Encounter (Signed)
LMTCB

## 2021-06-14 NOTE — Telephone Encounter (Signed)
Patient states the lyrica and meloxicam is not helping.   Left hand pain is only in the hand it self. Had cortisone injections in this hand twice and states she can not have anymore.   Patient states the numbness is from the knee down in both legs, right leg is not as bad. No low back pain.   Patient scheduled for x-ray 06/15/21.

## 2021-06-14 NOTE — Telephone Encounter (Signed)
Call pt At last visit we discussed peripheral neuropathy and I increased Lyrica to 50 mg in the morning, 50 mg midday and 100 mg at night.  Has them been helpful at all ?Also advised that she may take meloxicam 7.5 mg as needed.  Is left hand pain coming down entire arm or just in hand itself? We didn't discuss this in great detail at last visit.    For leg numbness, it is limited to feet itself or does she have low back pain and numbness which travels down entire leg?  If numbness runs down the back leg or  she has low back pain, I would recommend an MRI lumbar spine.   I had ordered an x-ray for her to have done back in May of the left hand.  Please advise that she schedule this.  If this is unrevealing, I will advise that she needs to see hand specialist, Dr. Stephenie Acres at [facility]

## 2021-06-15 ENCOUNTER — Other Ambulatory Visit: Payer: No Typology Code available for payment source

## 2021-06-15 ENCOUNTER — Other Ambulatory Visit: Payer: Self-pay

## 2021-06-15 ENCOUNTER — Ambulatory Visit (INDEPENDENT_AMBULATORY_CARE_PROVIDER_SITE_OTHER): Payer: No Typology Code available for payment source

## 2021-06-15 ENCOUNTER — Encounter: Payer: Self-pay | Admitting: Family

## 2021-06-15 DIAGNOSIS — M79642 Pain in left hand: Secondary | ICD-10-CM

## 2021-06-15 NOTE — Telephone Encounter (Signed)
Patient is returning your call.  

## 2021-06-15 NOTE — Telephone Encounter (Signed)
Call pt MRI lumbar ordered to eval for spinal stenosis Please confirm NO metal in body or pacemaker  Let us know if you dont hear back within a week in regards to an appointment being scheduled.   She may resume prior dose of lyrica 50mg  TID, please change on chart  If mobic not effective, please dc from chart  For numbness from knee down, I would advise neuropathy consult and further evaluation. I have placed referral  Let know if you dont hear back within a week in regards to an appointment being scheduled.

## 2021-06-15 NOTE — Telephone Encounter (Signed)
Called patient to discuss MRI. Phone was disconnected after the second ring. Could not leave a message to call back.

## 2021-06-15 NOTE — Addendum Note (Signed)
Addended by: Allegra Grana on: 06/15/2021 12:55 PM   Modules accepted: Orders

## 2021-06-16 MED ORDER — PREGABALIN 50 MG PO CAPS: ORAL_CAPSULE | ORAL | 2 refills | Status: DC

## 2021-06-16 NOTE — Telephone Encounter (Signed)
Medication has been updated

## 2021-06-16 NOTE — Telephone Encounter (Signed)
Patient is returning your call from the note below. 

## 2021-06-16 NOTE — Addendum Note (Signed)
Addended by: Hulan Fray on: 06/16/2021 01:25 PM   Modules accepted: Orders

## 2021-06-16 NOTE — Telephone Encounter (Signed)
Received a call back from [place]. Pt states that she does have a bar in her arm and a hip replacement. Pt declines the MRI and states that she has no lower back pain. Pt states that the numbness in her legs is due to poor circulation and states that she does not remember the doctor who evaluated her or the office name that she visited. Pt is agreeable to the neuropathy consult.  Pt states that Mobic does help and she is still taking it and she is agreeable to moving to Lyrica 3 times daily.

## 2021-06-20 ENCOUNTER — Other Ambulatory Visit: Payer: Self-pay | Admitting: Family

## 2021-06-20 DIAGNOSIS — E785 Hyperlipidemia, unspecified: Secondary | ICD-10-CM

## 2021-06-20 NOTE — Telephone Encounter (Signed)
FYI  Pt wants to cancel mri lumbar  Referral for neurology in place; please see

## 2021-06-21 ENCOUNTER — Other Ambulatory Visit: Payer: Self-pay | Admitting: Family

## 2021-06-21 DIAGNOSIS — M79641 Pain in right hand: Secondary | ICD-10-CM

## 2021-06-21 NOTE — Progress Notes (Signed)
Ref

## 2021-06-29 ENCOUNTER — Other Ambulatory Visit: Payer: Self-pay | Admitting: Family

## 2021-06-29 DIAGNOSIS — T466X5A Adverse effect of antihyperlipidemic and antiarteriosclerotic drugs, initial encounter: Secondary | ICD-10-CM

## 2021-06-29 DIAGNOSIS — E785 Hyperlipidemia, unspecified: Secondary | ICD-10-CM

## 2021-06-29 DIAGNOSIS — M791 Myalgia, unspecified site: Secondary | ICD-10-CM

## 2021-07-01 ENCOUNTER — Other Ambulatory Visit: Payer: Self-pay | Admitting: Family

## 2021-07-01 DIAGNOSIS — K2100 Gastro-esophageal reflux disease with esophagitis, without bleeding: Secondary | ICD-10-CM

## 2021-07-22 ENCOUNTER — Telehealth: Payer: No Typology Code available for payment source

## 2021-08-05 ENCOUNTER — Other Ambulatory Visit: Payer: Self-pay | Admitting: Family

## 2021-08-05 DIAGNOSIS — E1140 Type 2 diabetes mellitus with diabetic neuropathy, unspecified: Secondary | ICD-10-CM

## 2021-08-17 ENCOUNTER — Ambulatory Visit: Payer: No Typology Code available for payment source | Admitting: Pharmacist

## 2021-08-17 ENCOUNTER — Other Ambulatory Visit: Payer: Self-pay | Admitting: Family

## 2021-08-17 DIAGNOSIS — I10 Essential (primary) hypertension: Secondary | ICD-10-CM

## 2021-08-17 DIAGNOSIS — M791 Myalgia, unspecified site: Secondary | ICD-10-CM

## 2021-08-17 DIAGNOSIS — T466X5A Adverse effect of antihyperlipidemic and antiarteriosclerotic drugs, initial encounter: Secondary | ICD-10-CM

## 2021-08-17 DIAGNOSIS — E119 Type 2 diabetes mellitus without complications: Secondary | ICD-10-CM

## 2021-08-17 DIAGNOSIS — E785 Hyperlipidemia, unspecified: Secondary | ICD-10-CM

## 2021-08-17 NOTE — Chronic Care Management (AMB) (Signed)
Chronic Care Management Pharmacy Note  08/18/2021 Name:  Brittany [name] MRN:  [id number] DOB:  [birth year]  Subjective: Brittany [name] is an 65 y.o. year old female who is a primary patient of Burnard Hawthorne, FNP.  The CCM team was consulted for assistance with disease management and care coordination needs.    Engaged with patient by telephone for follow up visit for pharmacy case management and/or care coordination services.   Objective:  Medications Reviewed Today     Reviewed by De Hollingshead, RPH-CPP (Pharmacist) on 08/17/21 at 65  Med List Status: <None>   Medication Order Taking? Sig Documenting Provider Last Dose Status Informant  Accu-Chek FastClix Lancets MISC [id number]  Apply topically. [provider]  Active   blood glucose meter kit and supplies [id number]  Dispense based on patient and insurance preference. Use to check blood sugars daily (FOR ICD-10 E10.9, E11.9). Einar Pheasant, MD  Active   Blood Glucose Monitoring Suppl (ACCU-CHEK GUIDE) w/Device KIT [id number]  1 Device by Does not apply route daily as needed. Burnard Hawthorne, FNP  Active   cholecalciferol (VITAMIN D3) 25 MCG (1000 UNIT) tablet [id number] Yes Take 1,000 Units by mouth daily. [provider] Taking Active   DULoxetine (CYMBALTA) 60 MG capsule [id number] Yes TAKE 1 CAPSULE BY MOUTH EVERY DAY Burnard Hawthorne, FNP Taking Active   ELDERBERRY PO [id number] No Take 1 tablet by mouth daily.  Patient not taking: Reported on 08/17/2021   [provider] Not Taking Active   ezetimibe (ZETIA) 10 MG tablet [id number] Yes TAKE 1 TABLET BY MOUTH EVERY DAY Burnard Hawthorne, FNP Taking Active   FARXIGA 10 MG TABS tablet [id number] Yes TAKE 1 TABLET BY MOUTH EVERY DAY IN THE MORNING BEFORE BREAKFAST Burnard Hawthorne, FNP Taking Active   glimepiride (AMARYL) 4 MG tablet [id number] Yes Take 1 tablet (4 mg total) by mouth daily before breakfast. Burnard Hawthorne, FNP Taking  Active   glucose blood (ACCU-CHEK GUIDE) test strip [id number]  Use as instructed Burnard Hawthorne, FNP  Active   meloxicam (MOBIC) 7.5 MG tablet [id number] Yes TAKE 1 TABLET BY MOUTH DAILY AS NEEDED FOR PAIN Burnard Hawthorne, FNP Taking Active            Med Note Darnelle Maffucci, Arville Lime   Fri Mar 18, 2021  9:50 AM) ~2-3 times weekly  methimazole (TAPAZOLE) 5 MG tablet [id number] Yes Take 0.5 tablets by mouth daily. [provider] Taking Active   omeprazole (PRILOSEC) 40 MG capsule [id number] Yes TAKE 1 CAPSULE BY MOUTH DAILY AS NEEDED. Burnard Hawthorne, FNP Taking Active   pioglitazone (ACTOS) 15 MG tablet [id number] Yes Take 15 mg by mouth daily. [provider] Taking Active   PRALUENT 75 MG/ML SOAJ [id number] Yes INJECT 75 MG INTO THE SKIN EVERY 14 (FOURTEEN) DAYS. Burnard Hawthorne, FNP Taking Active   pregabalin (LYRICA) 50 MG capsule [id number] Yes Take $Remove'50mg'ywBFAWT$  qam , take $Remo'50mg'iezwS$  midday, and take $Remove'50mg'uKTIsgF$  qhs. Burnard Hawthorne, FNP Taking Active   TRULICITY 4.5 W[place]/6.3ZC SOPN [id number] No INJECT 4.5 MG AS DIRECTED ONCE A WEEK.  Patient not taking: Reported on 08/17/2021   Burnard Hawthorne, FNP Not Taking Active   Med List Note Leeanne Rio, [place] 01/10/17 1348): **[employer] EM[employer]**            Lab Results  Component Value Date   HGBA1C 6.7 (A) 06/07/2021   Lab Results  Component Value Date   CREATININE  0.56 (L) 03/07/2021   BUN 14 03/07/2021   NA 141 03/07/2021   K 4.2 03/07/2021   CL 105 03/07/2021   CO2 21 03/07/2021   Lab Results  Component Value Date   ALT 26 03/07/2021   AST 19 03/07/2021   ALKPHOS 70 03/07/2021   BILITOT 0.6 03/07/2021   Lab Results  Component Value Date   CHOL 199 03/07/2021   HDL 54 03/07/2021   LDLCALC 131 (H) 03/07/2021   TRIG 75 03/07/2021   CHOLHDL 3.7 03/07/2021     Assessment/Interventions: Review of patient past medical history, allergies, medications, health status, including review of consultants reports,  laboratory and other test data, was performed as part of comprehensive evaluation and provision of chronic care management services.   SDOH:  (Social Determinants of Health) assessments and interventions performed: Yes SDOH Interventions    Flowsheet Row Most Recent Value  SDOH Interventions   Financial Strain Interventions Intervention Not Indicated        CCM Care Plan  Review of patient past medical history, allergies, medications, health status, including review of consultants reports, laboratory and other test data, was performed as part of comprehensive evaluation and provision of chronic care management services.   Conditions to be addressed/monitored:  Hyperlipidemia and Diabetes  Care Plan : Medication Management  Updates made by De Hollingshead, RPH-CPP since 08/18/2021 12:00 AM     Problem: HLD, DM      Long-Range Goal: Disease Progression Prevention   Start Date: 03/18/2021  Recent Progress: On track  Priority: High  Note:   Current Barriers:  Unable to achieve control of cholesterol   Pharmacist Clinical Goal(s):  Over the next 90 days, patient will achieve control of cholesterol through collaboration with PharmD and provider.   Interventions: 1:1 collaboration with Burnard Hawthorne, FNP regarding development and update of comprehensive plan of care as evidenced by provider attestation and co-signature Inter-disciplinary care team collaboration (see longitudinal plan of care) Comprehensive medication review performed; medication list updated in electronic medical record  Hyperlipidemia: Uncontrolled; current treatment: ezetimibe 10 mg daily, OTC omega 3 fatty acids 1 g daily; Praluent 75 mg Q14 days Denies any recurrence of leg pain since restarting Praluent. Medications previously tried: rosuvastatin 10 mg daily, rosuvastatin 20 mg daily, atorvastatin 10 mg daily, atorvastatin 20 mg daily - all resulted in bilateral leg myalgias Continue current  regimen at this time. Follow up with PCP as scheduled   Diabetes: Controlled; current treatment: Trulicity 4.5 mg weekly - reports she has been off for ~ 2 weeks due to a backorder, Farxiga 10 mg daily, pioglitazone 15 mg daily, glimepiride 4 mg daily Hx metformin - unclear reason for discontinuation Hx Victoza - stomach upset Hx Jardiance - stomach upset Current blood sugar readings: not checking, denies symptoms of hypoglycemia.  Discussed with PCP. Due to Trulicity backorder, change to Ozempic. D/c Trulicity and start Ozempic 0.25 mg weekly for 2 weeks then increase to 0.5 mg weekly - quicker titration due to baseline GLP1 tolerability. Follow up with PCP as scheduled. Low threshold for minimization/discontinuation of sulfonylurea   Hyperthyroid/Thyroid Nodule: Managed by endocrinology; current regimen: methimazole 2.5 mg daily Previously recommended to continue current regiment at this time.   Peripheral Neuropathy: Moderately well controlled per patient report; current regimen: pregabalin 50 mg TID, duloxetine 60 mg daily, meloxicam 7.5 mg PRN Previously recommended to continue current regiment at this time.   GERD: Moderately well controlled per patient report; current regimen: omeprazole 40 mg PRN  Previously recommended to continue current regiment at this time.   Supplements: Vitamin D  Patient Goals/Self-Care Activities Over the next 90 days, patient will:  - take medications as prescribed check glucose daily, document, and provide at future appointments collaborate with provider on medication access solutions  Follow Up Plan: Telephone follow up appointment with care management team member scheduled for: ~ 12 weeks       Plan: Telephone follow up appointment with care management team member scheduled for:  12 weeks  Catie Darnelle Maffucci, PharmD, [place], [place] Pharmacist [employer] at [employer] [phone]

## 2021-08-17 NOTE — Patient Instructions (Addendum)
Brittany [name],   It was great to talk to you today!  Stop Trulicity and start Ozempic 0.25 mg weekly for 2 weeks, then increase to 0.5 mg weekly. Continue glimepiride 4 mg, pioglitazone 15 mg, and Farxiga 10 mg. Check your blood sugars twice daily:  1) Fasting, first thing in the morning before breakfast and  2) 2 hours after your largest meal.   For a goal A1c of less than 7%, goal fasting readings are less than 130 and goal 2 hour after meal readings are less than 180.   We recommend you get the influenza vaccine for this season.   We recommend you get the updated bivalent COVID-19 booster, at least 2 months after any prior doses. You may consider delaying a booster dose by 3 months from a prior episode of COVID-19 per the CDC.   You can find pharmacies that have this formulation in stock at [email or web address].   Take care!  Catie Feliz Beam, PharmD   Visit Information  PATIENT GOALS:  Goals Addressed               This Visit's Progress     Patient Stated     Medication Monitoring (pt-stated)        Patient Goals/Self-Care Activities Over the next 90 days, patient will:  - take medications as prescribed check glucose daily, document, and provide at future appointments collaborate with provider on medication access solutions         Patient verbalizes understanding of instructions provided today and agrees to view in MyChart.    Plan: Telephone follow up appointment with care management team member scheduled for:  12 weeks  Catie Feliz Beam, PharmD, [place], CPP Clinical Pharmacist [employer] at [employer] [phone]

## 2021-08-18 ENCOUNTER — Telehealth: Payer: Self-pay | Admitting: Family

## 2021-08-18 MED ORDER — OZEMPIC (0.25 OR 0.5 MG/DOSE) 2 MG/1.5ML ~~LOC~~ SOPN: PEN_INJECTOR | SUBCUTANEOUS | 2 refills | Status: DC

## 2021-08-18 NOTE — Telephone Encounter (Signed)
Spoke with doctor of the day, Dr. Judie Grieve. She is agreeable to switch. See CCM documentation

## 2021-08-18 NOTE — Telephone Encounter (Signed)
Patient is calling in to check on the status of Brittany [name] switching her medicine from Trulicity to [employer].Please call her at [phone].

## 2021-08-19 ENCOUNTER — Ambulatory Visit: Payer: No Typology Code available for payment source | Admitting: Pharmacist

## 2021-08-19 DIAGNOSIS — E119 Type 2 diabetes mellitus without complications: Secondary | ICD-10-CM

## 2021-08-19 DIAGNOSIS — E669 Obesity, unspecified: Secondary | ICD-10-CM

## 2021-08-19 NOTE — Chronic Care Management (AMB) (Signed)
 Chronic Care Management Pharmacy Note  08/19/2021 Name:  Brittany [name] MRN:  [id number] DOB:  06/08/1956  Subjective: Brittany [name] is an 65 y.o. year old female who is a primary patient of Arnett, [name] G, FNP.  The CCM team was consulted for assistance with disease management and care coordination needs.    Engaged with patient by telephone for follow up visit for pharmacy case management and/or care coordination services.   Objective:  Medications Reviewed Today     Reviewed by Travis, Catherine E, RPH-CPP (Pharmacist) on 08/17/21 at 1528  Med List Status: <None>   Medication Order Taking? Sig Documenting Provider Last Dose Status Informant  Accu-Chek FastClix Lancets MISC [id number]  Apply topically. [provider]  Active   blood glucose meter kit and supplies [id number]  Dispense based on patient and insurance preference. Use to check blood sugars daily (FOR ICD-10 E10.9, E11.9). Scott, Charlene, MD  Active   Blood Glucose Monitoring Suppl (ACCU-CHEK GUIDE) w/Device KIT [id number]  1 Device by Does not apply route daily as needed. Arnett, [name] G, FNP  Active   cholecalciferol (VITAMIN D3) 25 MCG (1000 UNIT) tablet [id number] Yes Take 1,000 Units by mouth daily. [provider] Taking Active   DULoxetine (CYMBALTA) 60 MG capsule [id number] Yes TAKE 1 CAPSULE BY MOUTH EVERY DAY Arnett, [name] G, FNP Taking Active   ELDERBERRY PO [id number] No Take 1 tablet by mouth daily.  Patient not taking: Reported on 08/17/2021   [provider] Not Taking Active   ezetimibe (ZETIA) 10 MG tablet [id number] Yes TAKE 1 TABLET BY MOUTH EVERY DAY Arnett, [name] G, FNP Taking Active   FARXIGA 10 MG TABS tablet [id number] Yes TAKE 1 TABLET BY MOUTH EVERY DAY IN THE MORNING BEFORE BREAKFAST Arnett, [name] G, FNP Taking Active   glimepiride (AMARYL) 4 MG tablet [id number] Yes Take 1 tablet (4 mg total) by mouth daily before breakfast. Arnett, [name] G, FNP Taking  Active   glucose blood (ACCU-CHEK GUIDE) test strip [id number]  Use as instructed Arnett, [name] G, FNP  Active   meloxicam (MOBIC) 7.5 MG tablet [id number] Yes TAKE 1 TABLET BY MOUTH DAILY AS NEEDED FOR PAIN Arnett, [name] G, FNP Taking Active            Med Note (TRAVIS, CATHERINE E   Fri Mar 18, 2021  9:50 AM) ~2-3 times weekly  methimazole (TAPAZOLE) 5 MG tablet [id number] Yes Take 0.5 tablets by mouth daily. [provider] Taking Active   omeprazole (PRILOSEC) 40 MG capsule [id number] Yes TAKE 1 CAPSULE BY MOUTH DAILY AS NEEDED. Arnett, [name] G, FNP Taking Active   pioglitazone (ACTOS) 15 MG tablet [id number] Yes Take 15 mg by mouth daily. [provider] Taking Active   PRALUENT 75 MG/ML SOAJ [id number] Yes INJECT 75 MG INTO THE SKIN EVERY 14 (FOURTEEN) DAYS. Arnett, [name] G, FNP Taking Active   pregabalin (LYRICA) 50 MG capsule [id number] Yes Take 50mg qam , take 50mg midday, and take 50mg qhs. Arnett, [name] G, FNP Taking Active   TRULICITY 4.5 MG/0.5ML SOPN [id number] No INJECT 4.5 MG AS DIRECTED ONCE A WEEK.  Patient not taking: Reported on 08/17/2021   Arnett, [name] G, FNP Not Taking Active   Med List Note (Wright, Latoya S, CMA 01/10/17 1348): **[employer] EMPLOYEE**             Assessment/Interventions: Review of patient past medical history, allergies, medications, health status, including review of consultants reports, laboratory and other test data, was   performed as part of comprehensive evaluation and provision of chronic care management services.   SDOH:  (Social Determinants of Health) assessments and interventions performed: Yes SDOH Interventions    Flowsheet Row Most Recent Value  SDOH Interventions   Financial Strain Interventions Other (Comment)  [prior authorization]        CCM Care Plan  Review of patient past medical history, allergies, medications, health status, including review of consultants reports, laboratory and other test  data, was performed as part of comprehensive evaluation and provision of chronic care management services.   Conditions to be addressed/monitored:  Hyperlipidemia and Diabetes  Care Plan : Medication Management  Updates made by De Hollingshead, RPH-CPP since 08/19/2021 12:00 AM     Problem: HLD, DM      Long-Range Goal: Disease Progression Prevention   Start Date: 03/18/2021  Recent Progress: On track  Priority: High  Note:   Current Barriers:  Unable to achieve control of cholesterol   Pharmacist Clinical Goal(s):  Over the next 90 days, patient will achieve control of cholesterol through collaboration with PharmD and provider.   Interventions: 1:1 collaboration with Burnard Hawthorne, FNP regarding development and update of comprehensive plan of care as evidenced by provider attestation and co-signature Inter-disciplinary care team collaboration (see longitudinal plan of care) Comprehensive medication review performed; medication list updated in electronic medical record  Hyperlipidemia: Uncontrolled; current treatment: ezetimibe 10 mg daily, OTC omega 3 fatty acids 1 g daily; Praluent 75 mg Q14 days Denies any recurrence of leg pain since restarting Praluent. Medications previously tried: rosuvastatin 10 mg daily, rosuvastatin 20 mg daily, atorvastatin 10 mg daily, atorvastatin 20 mg daily - all resulted in bilateral leg myalgias Continue current regimen at this time. Follow up with PCP as scheduled   Diabetes: Controlled; current treatment: Trulicity 4.5 mg weekly - reports she has been off for ~ 2 weeks due to a backorder, Farxiga 10 mg daily, pioglitazone 15 mg daily, glimepiride 4 mg daily Hx metformin - unclear reason for discontinuation Hx Victoza - stomach upset Hx Jardiance - stomach upset Current blood sugar readings: not checking, denies symptoms of hypoglycemia.  Received notice that PA was needed for Ozempic. Completed and was approved through  08/18/2024  Hyperthyroid/Thyroid Nodule: Managed by endocrinology; current regimen: methimazole 2.5 mg daily Previously recommended to continue current regiment at this time.   Peripheral Neuropathy: Moderately well controlled per patient report; current regimen: pregabalin 50 mg TID, duloxetine 60 mg daily, meloxicam 7.5 mg PRN Previously recommended to continue current regiment at this time.   GERD: Moderately well controlled per patient report; current regimen: omeprazole 40 mg PRN  Previously recommended to continue current regiment at this time.   Supplements: Vitamin D  Patient Goals/Self-Care Activities Over the next 90 days, patient will:  - take medications as prescribed check glucose daily, document, and provide at future appointments collaborate with provider on medication access solutions  Follow Up Plan: Telephone follow up appointment with care management team member scheduled for: ~ 12 weeks       Plan: Telephone follow up appointment with care management team member scheduled for:  12 weeks  Catie Darnelle Maffucci, PharmD, [place], [place] Pharmacist [employer] at [employer] [phone]

## 2021-08-19 NOTE — Patient Instructions (Signed)
Visit Information  PATIENT GOALS:  Goals Addressed               This Visit's Progress     Patient Stated     Medication Monitoring (pt-stated)        Patient Goals/Self-Care Activities Over the next 90 days, patient will:  - take medications as prescribed check glucose daily, document, and provide at future appointments collaborate with provider on medication access solutions         Patient verbalizes understanding of instructions provided today and agrees to view in MyChart.    Plan: Telephone follow up appointment with care management team member scheduled for:  12 weeks  Catie Feliz Beam, PharmD, [place], CPP Clinical Pharmacist [employer] at [employer] [phone]

## 2021-08-25 ENCOUNTER — Telehealth: Payer: Self-pay

## 2021-08-25 ENCOUNTER — Other Ambulatory Visit: Payer: Self-pay

## 2021-08-25 DIAGNOSIS — E119 Type 2 diabetes mellitus without complications: Secondary | ICD-10-CM

## 2021-08-25 MED ORDER — OZEMPIC (0.25 OR 0.5 MG/DOSE) 2 MG/1.5ML ~~LOC~~ SOPN: PEN_INJECTOR | SUBCUTANEOUS | 2 refills | Status: DC

## 2021-08-25 NOTE — Telephone Encounter (Signed)
Received fax from [facility] that Ozempic approved 08/19/21-08/18/24.

## 2021-09-05 LAB — HM DIABETES EYE EXAM

## 2021-09-07 ENCOUNTER — Encounter: Payer: Self-pay | Admitting: Family

## 2021-09-07 ENCOUNTER — Telehealth (INDEPENDENT_AMBULATORY_CARE_PROVIDER_SITE_OTHER): Payer: No Typology Code available for payment source | Admitting: Family

## 2021-09-07 VITALS — Ht 65.0 in | Wt 148.0 lb

## 2021-09-07 DIAGNOSIS — G6289 Other specified polyneuropathies: Secondary | ICD-10-CM | POA: Diagnosis not present

## 2021-09-07 DIAGNOSIS — E11319 Type 2 diabetes mellitus with unspecified diabetic retinopathy without macular edema: Secondary | ICD-10-CM

## 2021-09-07 DIAGNOSIS — E785 Hyperlipidemia, unspecified: Secondary | ICD-10-CM

## 2021-09-07 DIAGNOSIS — J40 Bronchitis, not specified as acute or chronic: Secondary | ICD-10-CM | POA: Diagnosis not present

## 2021-09-07 NOTE — Patient Instructions (Signed)
Please call to schedule your labs nonfasting in the next 2 to 3 weeks.  Please also call to schedule your physical.  Please let me know at any point when you are ready for colonoscopy so I can place referral

## 2021-09-07 NOTE — Assessment & Plan Note (Signed)
Chronic, stable.  Continue pregabalin 50 mg TID, duloxetine 60 mg daily, meloxicam 7.5 mg PRN

## 2021-09-07 NOTE — Assessment & Plan Note (Signed)
Anticipate improved however patient had been off her medications for couple weeks.  Continue  Praluent 75 mg q14 days, Zetia 10 mg.  We will update lipid panel at CPE

## 2021-09-07 NOTE — Progress Notes (Signed)
Virtual Visit via Video Note  I connected with@  on 09/07/21 at  2:00 PM EST by a video enabled telemedicine application and verified that I am speaking with the correct person using two identifiers.  Location patient: home Location provider:work  Persons participating in the virtual visit: patient, provider  I discussed the limitations of evaluation and management by telemedicine and the availability of in person appointments. The patient expressed understanding and agreed to proceed.  Interactive audio and video telecommunications were attempted between this provider and patient, however failed, due to patient having technical difficulties or patient did not have access to video capability.  We continued and completed visit with audio only.   HPI: Complains of productive cough x 4 days, unchanged Started mucinex with relief. Endorses nasal congestion.  No sob, cp, wheezing, fever, ear pain.   HLD-compliant with Praluent 75 mg q14 days, Zetia 10 mg. She had held medications leading up to carpal tunnel surgery.  She plans to resume this week  DM- compliant with Ozempic 0.25mg  weekly,Farxiga 10 mg daily, pioglitazone 15 mg daily, glimepiride 4 mg daily. She hasnt been checking blood sugars.   Peripheral neuropathy-compliant with pregabalin 50 mg TID, duloxetine 60 mg daily, meloxicam 7.5 mg PRN  Due colonoscopy and Pap smear ROS: See pertinent positives and negatives per HPI.    EXAM:  VITALS per patient if applicable: Ht 5\' 5"  (1.651 m)   Wt 148 lb (67.1 kg)   BMI 24.63 kg/m  BP Readings from Last 3 Encounters:  06/07/21 120/76  03/07/21 (!) 108/58  12/06/20 110/70   Wt Readings from Last 3 Encounters:  09/07/21 148 lb (67.1 kg)  06/07/21 153 lb 9.6 oz (69.7 kg)  03/07/21 152 lb 3.2 oz (69 kg)      ASSESSMENT AND PLAN:  Discussed the following assessment and plan:  Problem List Items Addressed This Visit       Respiratory   Bronchitis    No acute respiratory  distress.  Patient is afebrile.  Cough is responding to Mucinex.  Patient and I discussed continue to monitor symptoms and delaying antibiotic therapy in the absence of worsening or continuation of symptoms.  She will call and let me know how she is doing.        Endocrine   Diabetic retinopathy associated with type 2 diabetes mellitus (HCC) - Primary   Relevant Orders   Comprehensive metabolic panel   Hemoglobin A1c   Microalbumin / creatinine urine ratio     Nervous and Auditory   Peripheral neuropathy    Chronic, stable.  Continue pregabalin 50 mg TID, duloxetine 60 mg daily, meloxicam 7.5 mg PRN        Other   Hyperlipidemia    Anticipate improved however patient had been off her medications for couple weeks.  Continue  Praluent 75 mg q14 days, Zetia 10 mg.  We will update lipid panel at CPE       -we discussed possible serious and likely etiologies, options for evaluation and workup, limitations of telemedicine visit vs in person visit, treatment, treatment risks and precautions. Pt prefers to treat via telemedicine empirically rather then risking or undertaking an in person visit at this moment.  .   I discussed the assessment and treatment plan with the patient. The patient was provided an opportunity to ask questions and all were answered. The patient agreed with the plan and demonstrated an understanding of the instructions.   The patient was advised to call back or  seek an in-person evaluation if the symptoms worsen or if the condition fails to improve as anticipated.  I have spent 16 minutes with a patient including precharting, exam, reviewing medical records, and discussion plan of care.      [name], FNP

## 2021-09-07 NOTE — Assessment & Plan Note (Signed)
Lab Results  Component Value Date   HGBA1C 6.7 (A) 06/07/2021   Well-controlled at last visit.  Patient will come in the next couple weeks for repeat A1c.  Continue Ozempic 0.25mg  weekly,Farxiga 10 mg daily, pioglitazone 15 mg daily, glimepiride 4 mg daily

## 2021-09-07 NOTE — Assessment & Plan Note (Signed)
No acute respiratory distress.  Patient is afebrile.  Cough is responding to Mucinex.  Patient and I discussed continue to monitor symptoms and delaying antibiotic therapy in the absence of worsening or continuation of symptoms.  She will call and let me know how she is doing.

## 2021-09-13 ENCOUNTER — Other Ambulatory Visit: Payer: Self-pay | Admitting: Family

## 2021-09-13 DIAGNOSIS — M25552 Pain in left hip: Secondary | ICD-10-CM

## 2021-09-27 ENCOUNTER — Other Ambulatory Visit: Payer: Self-pay | Admitting: Family

## 2021-09-27 DIAGNOSIS — E119 Type 2 diabetes mellitus without complications: Secondary | ICD-10-CM

## 2021-09-27 NOTE — Telephone Encounter (Signed)
RX Refill: lyrica Last Seen: 09-07-21 Last Ordered: 06-16-21 Next Appt: NA

## 2021-10-08 ENCOUNTER — Telehealth: Payer: Self-pay | Admitting: Family

## 2021-10-08 DIAGNOSIS — E1140 Type 2 diabetes mellitus with diabetic neuropathy, unspecified: Secondary | ICD-10-CM

## 2021-10-10 ENCOUNTER — Other Ambulatory Visit: Payer: Self-pay

## 2021-10-10 DIAGNOSIS — E1140 Type 2 diabetes mellitus with diabetic neuropathy, unspecified: Secondary | ICD-10-CM

## 2021-10-10 MED ORDER — DULOXETINE HCL 60 MG PO CPEP: ORAL_CAPSULE | ORAL | 1 refills | Status: DC

## 2021-10-10 NOTE — Telephone Encounter (Signed)
Pt called in stating the medication (DULoxetine (CYMBALTA) 60 MG capsule) [facility] [address] [facility]. Pt requesting callback with update that the prescription has been sent over.

## 2021-10-10 NOTE — Telephone Encounter (Signed)
LM that [facility]. If any issues to give [place] a call.

## 2021-10-12 ENCOUNTER — Other Ambulatory Visit: Payer: Self-pay | Admitting: Family

## 2021-10-12 DIAGNOSIS — E1140 Type 2 diabetes mellitus with diabetic neuropathy, unspecified: Secondary | ICD-10-CM

## 2021-10-24 ENCOUNTER — Other Ambulatory Visit: Payer: Self-pay | Admitting: Family

## 2021-10-24 DIAGNOSIS — M791 Myalgia, unspecified site: Secondary | ICD-10-CM

## 2021-10-24 DIAGNOSIS — E785 Hyperlipidemia, unspecified: Secondary | ICD-10-CM

## 2021-11-03 ENCOUNTER — Telehealth: Payer: Self-pay | Admitting: Pharmacist

## 2021-11-03 ENCOUNTER — Ambulatory Visit: Payer: No Typology Code available for payment source | Admitting: Pharmacist

## 2021-11-03 DIAGNOSIS — E119 Type 2 diabetes mellitus without complications: Secondary | ICD-10-CM

## 2021-11-03 DIAGNOSIS — E785 Hyperlipidemia, unspecified: Secondary | ICD-10-CM

## 2021-11-03 DIAGNOSIS — E1140 Type 2 diabetes mellitus with diabetic neuropathy, unspecified: Secondary | ICD-10-CM

## 2021-11-03 MED ORDER — PREGABALIN 50 MG PO CAPS: ORAL_CAPSULE | ORAL | 2 refills | Status: DC

## 2021-11-03 NOTE — Telephone Encounter (Signed)
Patient requests refill on pregabalin. [facility]

## 2021-11-03 NOTE — Chronic Care Management (AMB) (Addendum)
Chronic Care Management CCM Pharmacy Note  11/03/2021 Name:  Brittany [name] MRN:  [id number] DOB:  08-15-1956  Summary: - Patient has retired, started on [employer] plan until Medicare takes effect in late March  Recommendations/Changes made from today's visit: - Completing PA requests for Praluent and Ozempic at this time  Subjective: Brittany [name] is an 66 y.o. year old female who is a primary patient of Brittany Hawthorne, FNP.  The CCM team was consulted for assistance with disease management and care coordination needs.    Engaged with patient by telephone for follow up visit for pharmacy case management and/or care coordination services.   Objective:  Medications Reviewed Today     Reviewed by De Hollingshead, RPH-CPP (Pharmacist) on 11/03/21 at 85  Med List Status: <None>   Medication Order Taking? Sig Documenting Provider Last Dose Status Informant  Accu-Chek FastClix Lancets MISC [id number]  Apply topically. [provider]  Active   blood glucose meter kit and supplies [id number]  Dispense based on patient and insurance preference. Use to check blood sugars daily (FOR ICD-10 E10.9, E11.9). Einar Pheasant, MD  Active   Blood Glucose Monitoring Suppl (ACCU-CHEK GUIDE) w/Device KIT [id number]  1 Device by Does not apply route daily as needed. Brittany Hawthorne, FNP  Active   cholecalciferol (VITAMIN D3) 25 MCG (1000 UNIT) tablet [id number] Yes Take 1,000 Units by mouth daily. [provider] Taking Active   DULoxetine (CYMBALTA) 60 MG capsule [id number] Yes TAKE 1 CAPSULE BY MOUTH EVERY DAY Brittany Hawthorne, FNP Taking Active   ezetimibe (ZETIA) 10 MG tablet [id number] Yes TAKE 1 TABLET BY MOUTH EVERY DAY Brittany Hawthorne, FNP Taking Active   FARXIGA 10 MG TABS tablet [id number] Yes TAKE 1 TABLET BY MOUTH EVERY DAY IN THE MORNING BEFORE BREAKFAST Brittany Hawthorne, FNP Taking Active   glimepiride (AMARYL) 4 MG tablet [id number] Yes Take 1 tablet (4 mg  total) by mouth daily before breakfast. Brittany Hawthorne, FNP Taking Active   glucose blood (ACCU-CHEK GUIDE) test strip [id number]  Use as instructed Brittany Hawthorne, FNP  Active   meloxicam (MOBIC) 7.5 MG tablet [id number] Yes TAKE 1 TABLET BY MOUTH EVERY DAY AS NEEDED FOR PAIN Brittany Hawthorne, FNP Taking Active   methimazole (TAPAZOLE) 5 MG tablet [id number] Yes Take 0.5 tablets by mouth daily. [provider] Taking Active   omeprazole (PRILOSEC) 40 MG capsule [id number] Yes TAKE 1 CAPSULE BY MOUTH DAILY AS NEEDED. Brittany Hawthorne, FNP Taking Active   pioglitazone (ACTOS) 15 MG tablet [id number] Yes Take 15 mg by mouth daily. [provider] Taking Active   PRALUENT 75 MG/ML SOAJ [id number] Yes INJECT 75 MG INTO THE SKIN EVERY 14 (FOURTEEN) DAYS. Brittany Hawthorne, FNP Taking Active   pregabalin (LYRICA) 50 MG capsule [id number] Yes TAKE 1 CAPSULE BY MOUTH EVERY MORNING TAKE 1 CAPSULE BY MOUTH MIDDAY AND TAKE 2 CAPSULES AT BEDTIME Dutch Quint B, FNP Taking Active   Semaglutide,0.25 or 0.5MG /DOS, (OZEMPIC, 0.25 OR 0.5 MG/DOSE,) 2 MG/1.5ML SOPN [id number] Yes Inject 0.25 mg weekly for 2 weeks, then increase to 0.5 mg weekly  Patient taking differently: Inject 0.5 mg into the skin once a week. Inject 0.25 mg weekly for 2 weeks, then increase to 0.5 mg weekly   Brittany Hawthorne, FNP Taking Active   Med List Note Brittany [name], [place] 01/10/17 1348): **[employer] EMPLOYEE**            Pertinent Labs:  Lab Results  Component Value Date   HGBA1C 6.7 (A) 06/07/2021   Lab Results  Component Value Date   CHOL 199 03/07/2021   HDL 54 03/07/2021   LDLCALC 131 (H) 03/07/2021   TRIG 75 03/07/2021   CHOLHDL 3.7 03/07/2021   Lab Results  Component Value Date   CREATININE 0.56 (L) 03/07/2021   BUN 14 03/07/2021   NA 141 03/07/2021   K 4.2 03/07/2021   CL 105 03/07/2021   CO2 21 03/07/2021    SDOH:  (Social Determinants of Health) assessments and interventions  performed:    [place]  Review of patient past medical history, allergies, medications, health status, including review of consultants reports, laboratory and other test data, was performed as part of comprehensive evaluation and provision of chronic care management services.   Care Plan : Medication Management  Updates made by De Hollingshead, RPH-CPP since 11/03/2021 12:00 AM     Problem: HLD, DM      Long-Range Goal: Disease Progression Prevention   Start Date: 03/18/2021  Recent Progress: On track  Priority: High  Note:   Current Barriers:  Unable to achieve control of cholesterol   Pharmacist Clinical Goal(s):  Over the next 90 days, patient will achieve control of cholesterol through collaboration with PharmD and provider.   Interventions: 1:1 collaboration with Brittany Hawthorne, FNP regarding development and update of comprehensive plan of care as evidenced by provider attestation and co-signature Inter-disciplinary care team collaboration (see longitudinal plan of care) Comprehensive medication review performed; medication list updated in electronic medical record  Hyperlipidemia: Uncontrolled, but was off therapy at last lipid panel; current treatment: ezetimibe 10 mg daily, OTC omega 3 fatty acids 1 g daily; Praluent 75 mg Q14 days Denies any recurrence of leg pain since restarting Praluent. Medications previously tried: rosuvastatin 10 mg daily, rosuvastatin 20 mg daily, atorvastatin 10 mg daily, atorvastatin 20 mg daily - all resulted in bilateral leg myalgias Completed PA for Praluent on [employer] plan due to intolerance to statins. It was noted that Praluent is not on the formulary for this plan. Attempted PA on Repatha.   Diabetes: Controlled; current treatment: Ozempic 0.5 mg weekly, Farxiga 10 mg daily, pioglitazone 15 mg daily, glimepiride 4 mg daily Hx metformin - unclear reason for discontinuation Hx Victoza - stomach upset Hx Jardiance -  stomach upset Current blood sugar readings: not checking, denies symptoms of hypoglycemia.  Completed PA for Ozempic through [employer]. Will follow for result.   Hyperthyroid/Thyroid Nodule: Managed by endocrinology; current regimen: methimazole 2.5 mg daily Previously recommended to continue current regiment at this time.   Peripheral Neuropathy: Moderately well controlled per patient report; current regimen: pregabalin 50 mg TID, duloxetine 60 mg daily, meloxicam 7.5 mg PRN Request refill on pregabalin today. Will notify PCP Previously recommended to continue current regiment at this time.   GERD: Moderately well controlled per patient report; current regimen: omeprazole 40 mg PRN  Previously recommended to continue current regiment at this time.   Supplements: Vitamin D  Patient Goals/Self-Care Activities Over the next 90 days, patient will:  - take medications as prescribed check glucose daily, document, and provide at future appointments collaborate with provider on medication access solutions        Plan: Telephone follow up appointment with care management team member scheduled for:  8 weeks  Catie Darnelle Maffucci, PharmD, [place], [place] Pharmacist [employer] at [employer] [phone]

## 2021-11-03 NOTE — Telephone Encounter (Signed)
Lyrica refilled  I looked up patient on [place] Controlled Substances Reporting System PMP AWARE and saw no activity that raised concern of inappropriate use.

## 2021-11-03 NOTE — Addendum Note (Signed)
Addended by: Allegra Grana on: 11/03/2021 03:20 PM   Modules accepted: Orders

## 2021-11-03 NOTE — Patient Instructions (Signed)
Lizamarie,   I will work on authorizations for your more expensive medications. I will be in touch.   Take care!  Catie Darnelle Maffucci, PharmD

## 2021-11-04 ENCOUNTER — Telehealth: Payer: Self-pay | Admitting: Pharmacist

## 2021-11-04 DIAGNOSIS — E785 Hyperlipidemia, unspecified: Secondary | ICD-10-CM

## 2021-11-04 NOTE — Telephone Encounter (Signed)
Additional documentation required for [employer] (PBM for [employer]) for Ozempic, showing proof of diagnosis of T2DM. Submitting today.

## 2021-11-07 MED ORDER — REPATHA SURECLICK 140 MG/ML ~~LOC~~ SOAJ: 140.0000 mg | SUBCUTANEOUS | 2 refills | Status: DC

## 2021-11-07 NOTE — Telephone Encounter (Signed)
PA approved for Ozempic 11/03/21-11/03/22.   Praluent was not on formulary. Submitted PA for Repatha. Approved 11/04/21-11/04/22.   Called patient, reviewed formulary preference. Encouraged to complete supply of Praluent, and on day that she is due for her next Praluent injection, start Repatha. Patient verbalized understanding.

## 2021-11-07 NOTE — Addendum Note (Signed)
Addended by: Lourena Simmonds on: 11/07/2021 08:37 AM   Modules accepted: Orders

## 2021-12-02 ENCOUNTER — Other Ambulatory Visit: Payer: Self-pay

## 2021-12-02 ENCOUNTER — Ambulatory Visit (INDEPENDENT_AMBULATORY_CARE_PROVIDER_SITE_OTHER): Payer: Self-pay | Admitting: Family

## 2021-12-02 ENCOUNTER — Encounter: Payer: Self-pay | Admitting: Family

## 2021-12-02 VITALS — BP 128/76 | HR 102 | Temp 98.4°F | Ht 65.0 in | Wt 155.0 lb

## 2021-12-02 DIAGNOSIS — E118 Type 2 diabetes mellitus with unspecified complications: Secondary | ICD-10-CM

## 2021-12-02 DIAGNOSIS — K2100 Gastro-esophageal reflux disease with esophagitis, without bleeding: Secondary | ICD-10-CM

## 2021-12-02 DIAGNOSIS — G6289 Other specified polyneuropathies: Secondary | ICD-10-CM

## 2021-12-02 DIAGNOSIS — E785 Hyperlipidemia, unspecified: Secondary | ICD-10-CM

## 2021-12-02 DIAGNOSIS — E11319 Type 2 diabetes mellitus with unspecified diabetic retinopathy without macular edema: Secondary | ICD-10-CM

## 2021-12-02 LAB — COMPREHENSIVE METABOLIC PANEL
AG Ratio: 1.7 (calc) (ref 1.0–2.5)
ALT: 25 U/L (ref 6–29)
AST: 20 U/L (ref 10–35)
Albumin: 4.7 g/dL (ref 3.6–5.1)
Alkaline phosphatase (APISO): 74 U/L (ref 37–153)
BUN: 25 mg/dL (ref 7–25)
CO2: 24 mmol/L (ref 20–32)
Calcium: 9.7 mg/dL (ref 8.6–10.4)
Chloride: 105 mmol/L (ref 98–110)
Creat: 0.69 mg/dL (ref 0.50–1.05)
Globulin: 2.7 g/dL (calc) (ref 1.9–3.7)
Glucose, Bld: 107 mg/dL — ABNORMAL HIGH (ref 65–99)
Potassium: 4 mmol/L (ref 3.5–5.3)
Sodium: 141 mmol/L (ref 135–146)
Total Bilirubin: 0.9 mg/dL (ref 0.2–1.2)
Total Protein: 7.4 g/dL (ref 6.1–8.1)

## 2021-12-02 LAB — POCT GLYCOSYLATED HEMOGLOBIN (HGB A1C): Hemoglobin A1C: 7.3 % — AB (ref 4.0–5.6)

## 2021-12-02 LAB — LIPID PANEL
Cholesterol: 156 mg/dL (ref <200)
HDL: 65 mg/dL (ref >=50)
LDL Cholesterol (Calc): 72 mg/dL (calc)
Non-HDL Cholesterol (Calc): 91 mg/dL (calc) (ref <130)
Total CHOL/HDL Ratio: 2.4 (calc) (ref <5.0)
Triglycerides: 105 mg/dL (ref <150)

## 2021-12-02 MED ORDER — OMEPRAZOLE 40 MG PO CPDR: 40.0000 mg | DELAYED_RELEASE_CAPSULE | Freq: Every day | ORAL | 1 refills | Status: DC | PRN

## 2021-12-02 NOTE — Progress Notes (Signed)
Subjective:    Patient ID: Brittany [name], female    DOB: 24-Aug-1956, 66 y.o.   MRN: [id number]  CC: Brittany [name] is a 66 y.o. female who presents today for follow up.   HPI: Feels well today.  No new complaints Accompanied by granddaughter  HLD- compliant with ezetimibe 10 mg daily,  Praluent 75 mg Q14 days  DM- compliant with Ozempic 0.5 mg weekly, Farxiga 10 mg daily, pioglitazone 15 mg daily, glimepiride 4 mg daily qam with breakfast. She recent teeth pulled and endorses dietary indiscretion.   Peripheral neuropathy-compliant with  duloxetine 60 mg daily, meloxicam 7.5 mg PRN  She declines colon cancer screening at this time HISTORY:  Past Medical History:  Diagnosis Date   Diabetes mellitus without complication ([place])    Hyperlipidemia    Hypertension    pt states on medication as prevenative   Obesity    Past Surgical History:  Procedure Laterality Date   ABDOMINAL HYSTERECTOMY     COLONOSCOPY WITH PROPOFOL N/A 06/25/2015   Procedure: COLONOSCOPY WITH PROPOFOL;  Surgeon: Lucilla Lame, MD;  Location: [facility];  Service: Endoscopy;  Laterality: N/A;  Diabetic - oral meds   ELBOW SURGERY Left    HIP SURGERY Right    twice   Family History  Problem Relation Age of Onset   Diabetes Mother    Cancer Father        unknown; '90 % cancer'   Diabetes Sister    Diabetes Brother    Breast cancer Cousin        unknown age at dx.   Thyroid cancer Neg Hx     Allergies: Jardiance [empagliflozin], Lisinopril, Atorvastatin, and Victoza [liraglutide] Current Outpatient Medications on File Prior to Visit  Medication Sig Dispense Refill   Accu-Chek FastClix Lancets MISC Apply topically.     blood glucose meter kit and supplies Dispense based on patient and insurance preference. Use to check blood sugars daily (FOR ICD-10 E10.9, E11.9). 1 each 0   Blood Glucose Monitoring Suppl (ACCU-CHEK GUIDE) w/Device KIT 1 Device by Does not apply route daily as needed. 1 kit 0    cetirizine (ZYRTEC) 10 MG tablet Take 10 mg by mouth daily.     cholecalciferol (VITAMIN D3) 25 MCG (1000 UNIT) tablet Take 1,000 Units by mouth daily.     DULoxetine (CYMBALTA) 60 MG capsule TAKE 1 CAPSULE BY MOUTH EVERY DAY 90 capsule 1   Evolocumab (REPATHA SURECLICK) 244 MG/ML SOAJ Inject 140 mg into the skin every 14 (fourteen) days. 2 mL 2   ezetimibe (ZETIA) 10 MG tablet TAKE 1 TABLET BY MOUTH EVERY DAY 90 tablet 3   FARXIGA 10 MG TABS tablet TAKE 1 TABLET BY MOUTH EVERY DAY IN THE MORNING BEFORE BREAKFAST 30 tablet 4   glimepiride (AMARYL) 4 MG tablet Take 1 tablet (4 mg total) by mouth daily before breakfast. 90 tablet 3   glucose blood (ACCU-CHEK GUIDE) test strip Use as instructed 100 each 12   meloxicam (MOBIC) 7.5 MG tablet TAKE 1 TABLET BY MOUTH EVERY DAY AS NEEDED FOR PAIN 90 tablet 1   methimazole (TAPAZOLE) 5 MG tablet Take 0.5 tablets by mouth daily.     pioglitazone (ACTOS) 15 MG tablet Take 15 mg by mouth daily.     Semaglutide,0.25 or 0.5MG/DOS, (OZEMPIC, 0.25 OR 0.5 MG/DOSE,) 2 MG/1.5ML SOPN Inject 0.25 mg weekly for 2 weeks, then increase to 0.5 mg weekly (Patient taking differently: Inject 0.5 mg into the skin  once a week. Inject 0.25 mg weekly for 2 weeks, then increase to 0.5 mg weekly) 1.5 mL 2   No current facility-administered medications on file prior to visit.    Social History   Tobacco Use   Smoking status: Never   Smokeless tobacco: Never  Vaping Use   Vaping Use: Never used  Substance Use Topics   Alcohol use: Yes    Alcohol/week: 0.0 standard drinks    Comment: 1 times a year-wine   Drug use: No    Review of Systems  Constitutional:  Negative for chills and fever.  Respiratory:  Negative for cough.   Cardiovascular:  Negative for chest pain and palpitations.  Gastrointestinal:  Negative for nausea and vomiting.  Neurological:  Positive for numbness (left great toe).     Objective:    BP 128/76 (BP Location: Left Arm, Patient Position:  Sitting, Cuff Size: Normal)    Pulse (!) 102    Temp 98.4 F (36.9 C) (Oral)    Ht 5' 5" (1.651 m)    Wt 155 lb (70.3 kg)    SpO2 97%    BMI 25.79 kg/m  BP Readings from Last 3 Encounters:  12/02/21 128/76  06/07/21 120/76  03/07/21 (!) 108/58   Wt Readings from Last 3 Encounters:  12/02/21 155 lb (70.3 kg)  09/07/21 148 lb (67.1 kg)  06/07/21 153 lb 9.6 oz (69.7 kg)    Physical Exam Vitals reviewed.  Constitutional:      Appearance: She is well-developed.  Eyes:     Conjunctiva/sclera: Conjunctivae normal.  Cardiovascular:     Rate and Rhythm: Normal rate and regular rhythm.     Pulses: Normal pulses.     Heart sounds: Normal heart sounds.  Pulmonary:     Effort: Pulmonary effort is normal.     Breath sounds: Normal breath sounds. No wheezing, rhonchi or rales.  Skin:    General: Skin is warm and dry.  Neurological:     Mental Status: She is alert.  Psychiatric:        Speech: Speech normal.        Behavior: Behavior normal.        Thought Content: Thought content normal.       Assessment & Plan:   Problem List Items Addressed This Visit       Endocrine   Diabetic retinopathy associated with type 2 diabetes mellitus ([place]) - Primary   Relevant Orders   Lipid panel   Comprehensive metabolic panel   Urine Microalbumin w/creat. ratio   POCT HgB A1C (Completed)   DM (diabetes mellitus) with complications ([facility])    Lab Results  Component Value Date   HGBA1C 7.3 (A) 12/02/2021  Uncontrolled. Advised to increase ozempic. She declines as would prefer to work on dietary changes. Continue Ozempic 0.5 mg weekly, Farxiga 10 mg daily, pioglitazone 15 mg daily, glimepiride 4 mg daily qam with breakfast.  Pending microalbumin and advised if present, to trial losartan ( cough with lisinopril).         Nervous and Auditory   Peripheral neuropathy    Chronic, stable.  She stopped Lyrica as did not find to be particularly helpful.  Continue duloxetine 60 mg daily, meloxicam  7.5 mg PRN        Other   Hyperlipidemia    Previously uncontrolled.  Pending lipid panel.  Continue ezetimibe 10 mg daily,  Praluent 75 mg Q14 days      Other Visit Diagnoses  Reflux esophagitis       Relevant Medications   omeprazole (PRILOSEC) 40 MG capsule        I have discontinued Karsten Fells. [place]'s pregabalin. I have also changed her omeprazole. Additionally, I am having her maintain her blood glucose meter kit and supplies, Accu-Chek Guide, Accu-Chek Guide, Accu-Chek FastClix Lancets, pioglitazone, methimazole, glimepiride, ezetimibe, Farxiga, cholecalciferol, Ozempic (0.25 or 0.5 MG/DOSE), meloxicam, DULoxetine, cetirizine, and Repatha SureClick.   Meds ordered this encounter  Medications   omeprazole (PRILOSEC) 40 MG capsule    Sig: Take 1 capsule (40 mg total) by mouth daily as needed.    Dispense:  90 capsule    Refill:  1    DX Code Needed  .    Order Specific Question:   Supervising Provider    Answer:   Crecencio Mc [[id number]]    Return precautions given.   Risks, benefits, and alternatives of the medications and treatment plan prescribed today were discussed, and patient expressed understanding.   Education regarding symptom management and diagnosis given to patient on AVS.  Continue to follow with Burnard Hawthorne, FNP for routine health maintenance.   Linard Millers and I agreed with plan.   Mable Paris, FNP

## 2021-12-02 NOTE — Assessment & Plan Note (Signed)
Chronic, stable.  She stopped Lyrica as did not find to be particularly helpful.  Continue duloxetine 60 mg daily, meloxicam 7.5 mg PRN

## 2021-12-02 NOTE — Assessment & Plan Note (Signed)
Previously uncontrolled.  Pending lipid panel.  Continue ezetimibe 10 mg daily,  Praluent 75 mg Q14 days

## 2021-12-02 NOTE — Patient Instructions (Signed)
Nice to see you!   

## 2021-12-02 NOTE — Assessment & Plan Note (Addendum)
Lab Results  Component Value Date   HGBA1C 7.3 (A) 12/02/2021   Uncontrolled. Advised to increase ozempic. She declines as would prefer to work on dietary changes. Continue Ozempic 0.5 mg weekly, Farxiga 10 mg daily, pioglitazone 15 mg daily, glimepiride 4 mg daily qam with breakfast.  Pending microalbumin and advised if present, to trial losartan ( cough with lisinopril).

## 2021-12-03 LAB — MICROALBUMIN / CREATININE URINE RATIO
Creatinine, Urine: 92 mg/dL (ref 20–275)
Microalb Creat Ratio: 7 mcg/mg creat (ref <30)
Microalb, Ur: 0.6 mg/dL

## 2021-12-10 ENCOUNTER — Other Ambulatory Visit: Payer: Self-pay | Admitting: Family

## 2021-12-10 DIAGNOSIS — E11319 Type 2 diabetes mellitus with unspecified diabetic retinopathy without macular edema: Secondary | ICD-10-CM

## 2021-12-24 ENCOUNTER — Other Ambulatory Visit: Payer: Self-pay | Admitting: Family

## 2021-12-24 DIAGNOSIS — K2100 Gastro-esophageal reflux disease with esophagitis, without bleeding: Secondary | ICD-10-CM

## 2021-12-26 ENCOUNTER — Ambulatory Visit: Payer: Self-pay | Admitting: Pharmacist

## 2021-12-26 NOTE — Chronic Care Management (AMB) (Signed)
?  Chronic Care Management  ? ?Note ? ?12/26/2021 ?Name: Brittany [name] MRN: [id number] DOB: [birth year] ? ? ? ?Closing pharmacy CCM case at this time. Will collaborate with Care Guide to outreach to schedule follow up with RN CM. Patient has clinic contact information for future questions or concerns.  ? ?Catie Feliz Beam, PharmD, [place], CPP ?Clinical Pharmacist ?Nature conservation officer at [employer] ?[phone] ? ?

## 2022-01-19 ENCOUNTER — Other Ambulatory Visit: Payer: Self-pay | Admitting: Family

## 2022-01-25 ENCOUNTER — Telehealth: Payer: No Typology Code available for payment source

## 2022-02-06 ENCOUNTER — Other Ambulatory Visit: Payer: Self-pay | Admitting: Internal Medicine

## 2022-02-06 DIAGNOSIS — E785 Hyperlipidemia, unspecified: Secondary | ICD-10-CM

## 2022-03-01 ENCOUNTER — Ambulatory Visit: Payer: 59 | Admitting: Family

## 2022-04-11 ENCOUNTER — Other Ambulatory Visit: Payer: Self-pay | Admitting: Family

## 2022-04-11 DIAGNOSIS — E1140 Type 2 diabetes mellitus with diabetic neuropathy, unspecified: Secondary | ICD-10-CM

## 2022-05-26 ENCOUNTER — Other Ambulatory Visit: Payer: Self-pay | Admitting: Family

## 2022-05-26 DIAGNOSIS — M25552 Pain in left hip: Secondary | ICD-10-CM

## 2022-06-02 ENCOUNTER — Other Ambulatory Visit: Payer: Self-pay | Admitting: Family

## 2022-06-02 DIAGNOSIS — K2100 Gastro-esophageal reflux disease with esophagitis, without bleeding: Secondary | ICD-10-CM

## 2022-06-12 ENCOUNTER — Other Ambulatory Visit: Payer: Self-pay | Admitting: Internal Medicine

## 2022-06-12 ENCOUNTER — Other Ambulatory Visit: Payer: Self-pay | Admitting: Family

## 2022-06-23 ENCOUNTER — Ambulatory Visit (INDEPENDENT_AMBULATORY_CARE_PROVIDER_SITE_OTHER): Payer: Medicare Other | Admitting: Family

## 2022-06-23 ENCOUNTER — Encounter: Payer: Self-pay | Admitting: Family

## 2022-06-23 VITALS — BP 118/68 | HR 97 | Temp 98.4°F | Ht 63.0 in | Wt 162.2 lb

## 2022-06-23 DIAGNOSIS — Z23 Encounter for immunization: Secondary | ICD-10-CM

## 2022-06-23 DIAGNOSIS — Z13820 Encounter for screening for osteoporosis: Secondary | ICD-10-CM | POA: Diagnosis not present

## 2022-06-23 DIAGNOSIS — E785 Hyperlipidemia, unspecified: Secondary | ICD-10-CM

## 2022-06-23 DIAGNOSIS — E118 Type 2 diabetes mellitus with unspecified complications: Secondary | ICD-10-CM

## 2022-06-23 LAB — POCT GLYCOSYLATED HEMOGLOBIN (HGB A1C): Hemoglobin A1C: 8.7 % — AB (ref 4.0–5.6)

## 2022-06-23 MED ORDER — REPATHA SURECLICK 140 MG/ML ~~LOC~~ SOAJ: 140.0000 mg | SUBCUTANEOUS | 3 refills | Status: DC

## 2022-06-23 MED ORDER — BLOOD GLUCOSE METER KIT: PACK | 0 refills | Status: DC

## 2022-06-23 MED ORDER — SEMAGLUTIDE (1 MG/DOSE) 4 MG/3ML ~~LOC~~ SOPN: 1.0000 mg | PEN_INJECTOR | SUBCUTANEOUS | 3 refills | Status: DC

## 2022-06-23 NOTE — Assessment & Plan Note (Signed)
Uncontrolled.  We agreed to increase Ozempic to 1 mg.  She will continue Farxiga 10 mg daily, pioglitazone 15 mg daily, glimepiride 4 mg daily qam with breakfast.

## 2022-06-23 NOTE — Progress Notes (Signed)
Discussed during OV. Please see OV notes

## 2022-06-23 NOTE — Patient Instructions (Signed)
Increase Ozempic to 1 mg.  Please let me know praluent is not covered by your insurance.  Please call  and schedule your bone density scan   [facility]  ( new location in 2023)  [address], [place], [place] [address]  [place], [place]  [phone]

## 2022-06-23 NOTE — Assessment & Plan Note (Signed)
Lab Results  Component Value Date   LDLCALC 72 12/02/2021   Chronic, stable.  Continue ezetimibe 10 mg, Praluent 75 mg

## 2022-06-23 NOTE — Progress Notes (Unsigned)
Subjective:    Patient ID: Brittany [name], female    DOB: May 05, 1956, 66 y.o.   MRN: [id number]  CC: Brittany [name] is a 66 y.o. female who presents today for follow up.   HPI: Feels well today.  No new complaints   declines screening for breast cancer .  She is interested in bone density screening   DM-compliant with Ozempic 0.5 mg weekly, Farxiga 10 mg daily, pioglitazone 15 mg daily, glimepiride 4 mg daily qam with breakfast.  HLD- compliant with ezetimibe 10 mg daily. She had stopped Praluent 75 mg Q14 days due to insurance lapse.    due for PCV20 5 years after last PNA dose HISTORY:  Past Medical History:  Diagnosis Date   Diabetes mellitus without complication ([place])    Hyperlipidemia    Hypertension    pt states on medication as prevenative   Obesity    Past Surgical History:  Procedure Laterality Date   ABDOMINAL HYSTERECTOMY     COLONOSCOPY WITH PROPOFOL N/A 06/25/2015   Procedure: COLONOSCOPY WITH PROPOFOL;  Surgeon: Lucilla Lame, MD;  Location: [facility];  Service: Endoscopy;  Laterality: N/A;  Diabetic - oral meds   ELBOW SURGERY Left    HIP SURGERY Right    twice   Family History  Problem Relation Age of Onset   Diabetes Mother    Cancer Father        unknown; '90 % cancer'   Diabetes Sister    Diabetes Brother    Breast cancer Cousin        unknown age at dx.   Thyroid cancer Neg Hx     Allergies: Jardiance [empagliflozin], Lisinopril, Atorvastatin, and Victoza [liraglutide] Current Outpatient Medications on File Prior to Visit  Medication Sig Dispense Refill   Accu-Chek FastClix Lancets MISC Apply topically.     Blood Glucose Monitoring Suppl (ACCU-CHEK GUIDE) w/Device KIT 1 Device by Does not apply route daily as needed. 1 kit 0   cetirizine (ZYRTEC) 10 MG tablet Take 10 mg by mouth daily.     cholecalciferol (VITAMIN D3) 25 MCG (1000 UNIT) tablet Take 1,000 Units by mouth daily.     DULoxetine (CYMBALTA) 60 MG capsule TAKE 1 CAPSULE BY  MOUTH EVERY DAY 90 capsule 1   ezetimibe (ZETIA) 10 MG tablet TAKE 1 TABLET BY MOUTH EVERY DAY 90 tablet 3   FARXIGA 10 MG TABS tablet TAKE 1 TABLET BY MOUTH EVERY DAY IN THE MORNING BEFORE BREAKFAST 30 tablet 2   glimepiride (AMARYL) 4 MG tablet TAKE 1 TABLET BY MOUTH DAILY BEFORE BREAKFAST. 90 tablet 3   glucose blood (ACCU-CHEK GUIDE) test strip Use as instructed 100 each 12   meloxicam (MOBIC) 7.5 MG tablet TAKE 1 TABLET BY MOUTH EVERY DAY AS NEEDED FOR PAIN 90 tablet 1   methimazole (TAPAZOLE) 5 MG tablet Take 0.5 tablets by mouth daily.     omeprazole (PRILOSEC) 40 MG capsule TAKE 1 CAPSULE BY MOUTH DAILY AS NEEDED. 90 capsule 1   pioglitazone (ACTOS) 15 MG tablet Take 15 mg by mouth daily.     No current facility-administered medications on file prior to visit.    Social History   Tobacco Use   Smoking status: Never   Smokeless tobacco: Never  Vaping Use   Vaping Use: Never used  Substance Use Topics   Alcohol use: Yes    Alcohol/week: 0.0 standard drinks of alcohol    Comment: 1 times a year-wine   Drug use: No  Review of Systems  Constitutional:  Negative for chills and fever.  Respiratory:  Negative for cough.   Cardiovascular:  Negative for chest pain and palpitations.  Gastrointestinal:  Negative for nausea and vomiting.      Objective:    BP 118/68 (BP Location: Left Arm, Patient Position: Sitting, Cuff Size: Normal)   Pulse 97   Temp 98.4 F (36.9 C) (Oral)   Ht _0  (1.6 m)   Wt 162 lb 3.2 oz (73.6 kg)   SpO2 97%   BMI 28.73 kg/m  BP Readings from Last 3 Encounters:  06/23/22 118/68  12/02/21 128/76  06/07/21 120/76   Wt Readings from Last 3 Encounters:  06/23/22 162 lb 3.2 oz (73.6 kg)  12/02/21 155 lb (70.3 kg)  09/07/21 148 lb (67.1 kg)    Physical Exam Vitals reviewed.  Constitutional:      Appearance: She is well-developed.  Eyes:     Conjunctiva/sclera: Conjunctivae normal.  Cardiovascular:     Rate and Rhythm: Normal rate and  regular rhythm.     Pulses: Normal pulses.     Heart sounds: Normal heart sounds.  Pulmonary:     Effort: Pulmonary effort is normal.     Breath sounds: Normal breath sounds. No wheezing, rhonchi or rales.  Skin:    General: Skin is warm and dry.  Neurological:     Mental Status: She is alert.  Psychiatric:        Speech: Speech normal.        Behavior: Behavior normal.        Thought Content: Thought content normal.        Assessment & Plan:   Problem List Items Addressed This Visit       Endocrine   DM (diabetes mellitus) with complications ([place]) - Primary    Uncontrolled.  We agreed to increase Ozempic to 1 mg.  She will continue Farxiga 10 mg daily, pioglitazone 15 mg daily, glimepiride 4 mg daily qam with breakfast.       Relevant Medications   Semaglutide, 1 MG/DOSE, 4 MG/3ML SOPN   Other Relevant Orders   POCT HgB A1C (Completed)     Other   Hyperlipidemia    Lab Results  Component Value Date   LDLCALC 72 12/02/2021  Chronic, stable.  Continue ezetimibe 10 mg, Praluent 75 mg      Relevant Medications   Evolocumab (REPATHA SURECLICK) 892 MG/ML SOAJ   Other Visit Diagnoses     Screening for osteoporosis       Relevant Orders   DG Bone Density   Need for pneumococcal vaccine       Relevant Orders   Pneumococcal conjugate vaccine 20-valent (Prevnar 20)        I have discontinued Karsten Fells. [place]'s blood glucose meter kit and supplies, Ozempic (0.25 or 0.5 MG/DOSE), and Ozempic (0.25 or 0.5 MG/DOSE). I have also changed her Repatha SureClick. Additionally, I am having her start on blood glucose meter kit and supplies. Lastly, I am having her maintain her Accu-Chek Guide, Accu-Chek Guide, Accu-Chek FastClix Lancets, pioglitazone, methimazole, ezetimibe, cholecalciferol, cetirizine, glimepiride, DULoxetine, meloxicam, omeprazole, Farxiga, and Semaglutide (1 MG/DOSE).   Meds ordered this encounter  Medications   Evolocumab (REPATHA SURECLICK) 119 MG/ML SOAJ     Sig: Inject 140 mg into the skin every 14 (fourteen) days.    Dispense:  1 mL    Refill:  3    Order Specific Question:   Supervising Provider  Answer:   Crecencio Mc [[id number]]   DISCONTD: Semaglutide, 1 MG/DOSE, 4 MG/3ML SOPN    Sig: Inject 1 mg as directed once a week.    Dispense:  3 mL    Refill:  3    Order Specific Question:   Supervising Provider    Answer:   Derrel Nip, TERESA L [[id number]]   Semaglutide, 1 MG/DOSE, 4 MG/3ML SOPN    Sig: Inject 1 mg as directed once a week.    Dispense:  3 mL    Refill:  3   blood glucose meter kit and supplies    Sig: Dispense based on patient and insurance preference. Use up to four times daily as directed. (FOR ICD-10 E10.9, E11.9).    Dispense:  1 each    Refill:  0    Order Specific Question:   Number of strips    Answer:   200    Order Specific Question:   Number of lancets    Answer:   200    Return precautions given.   Risks, benefits, and alternatives of the medications and treatment plan prescribed today were discussed, and patient expressed understanding.   Education regarding symptom management and diagnosis given to patient on AVS.  Continue to follow with Burnard Hawthorne, FNP for routine health maintenance.   Linard Millers and I agreed with plan.   Mable Paris, FNP

## 2022-07-05 ENCOUNTER — Telehealth: Payer: Self-pay | Admitting: Family

## 2022-07-05 NOTE — Telephone Encounter (Signed)
Pt called stating her insurance will not cover the cholesterol medication

## 2022-07-07 NOTE — Telephone Encounter (Signed)
Brittany [name],   Can we do PA for Repatha?  Previously Brittany [name], [address].D. I think it had this approved.  Please ask [name] for assistance if you are not sure.  Please let patient know we are working on it

## 2022-07-13 ENCOUNTER — Other Ambulatory Visit: Payer: Self-pay | Admitting: Family

## 2022-07-14 NOTE — Telephone Encounter (Signed)
Pt called stating she need assistance in paying for some of her medication-Ozempic and Fargis. Pt would like to be called

## 2022-07-19 ENCOUNTER — Telehealth: Payer: Self-pay | Admitting: Pharmacist

## 2022-07-19 NOTE — Progress Notes (Signed)
[facility])                                            [place] Team    07/19/2022  Brittany [name] [birth year] [id number]   Attempted to call patient regarding patient assistance for Farxiga and Ozempic.  No one answered and voicemail box was full.  Will make a second attempt to reach the patient tomorrow.  Curlene Labrum, PharmD [place] Pharmacist Office: [phone]

## 2022-07-19 NOTE — Telephone Encounter (Signed)
Spoke to patient in regards to her message and informed her that I had sent an email to [place] to see if they will assist with her medication. I informed her that they will contact her themselves.

## 2022-07-25 ENCOUNTER — Telehealth: Payer: Self-pay | Admitting: Pharmacist

## 2022-07-25 ENCOUNTER — Other Ambulatory Visit (HOSPITAL_COMMUNITY): Payer: Self-pay

## 2022-07-25 NOTE — Progress Notes (Signed)
[place] [facility])  [place] Team   Reason for referral: Medication Assistance for [place] and [place] referral is being closed due to the following reasons:  I have spoken with both the patient and her pharmacy several times to exhaust all options.  Patient currently is not eligible for PAP through the manufactures since she has [place] Medicaid. Pharmacy states (multiple people spoken with) they are billing both of her insurances.  [facility] is happy to assist the patient/family in the future for clinical pharmacy needs, following a discussion from your team about [place] outreach. Thank you for allowing [facility] to be a part of your patient's care.   Curlene Labrum, PharmD [place] Pharmacist Office: [phone]

## 2022-07-28 ENCOUNTER — Telehealth: Payer: Self-pay

## 2022-07-28 NOTE — Telephone Encounter (Signed)
Patient states her pharmacy is having trouble getting Ozempic in and asked her to call us to see if we have any samples.  Patient states she hasn't had this medication in two weeks.

## 2022-08-02 NOTE — Telephone Encounter (Signed)
Please call pt  This has been very frustrating in regards to backorder on this medication as very difficult to find.  We samples of 0.25mg  and 0.5mg  which I dont know if that would be helpful.   Would she be willing to consider an oral medication Rybelsus?  It is the same medication as Ozempic however it is oral ,not injectable.    She would take Rybelsus every single day.  Please read to her and mail to her if needed.   Instructions are Rybelsus are take 3 mg tablet for 30 days once daily and then increase to 7 mg.  Take medication with less than 4 ounces of water and take 30 minutes before first meal or drink or medication of the day  Let me know and I will send in

## 2022-08-04 ENCOUNTER — Other Ambulatory Visit: Payer: Self-pay

## 2022-08-04 MED ORDER — RYBELSUS 3 MG PO TABS: 3.0000 mg | ORAL_TABLET | Freq: Every day | ORAL | 2 refills | Status: DC

## 2022-08-04 NOTE — Telephone Encounter (Signed)
Call pt  We do not have 1 mg dose of Ozempic in samples.    Nor do we know when we will  get samples as in   For her safety I recommend Rybelsus as we have seen less issues with Rybelsus during backorder.    If agreeable, please send in Rybelsus 3 mg per previous note.  Sch f/u with me

## 2022-08-04 NOTE — Telephone Encounter (Signed)
Spoke to patient in regards to  Rybelsus and she is willing to try it to see if it works. Rx sent in. I informed pt to let me know if her insurance will cover or if she will ned PA.

## 2022-08-08 ENCOUNTER — Telehealth: Payer: Self-pay | Admitting: Family

## 2022-08-08 ENCOUNTER — Other Ambulatory Visit: Payer: Self-pay

## 2022-08-08 DIAGNOSIS — E11319 Type 2 diabetes mellitus with unspecified diabetic retinopathy without macular edema: Secondary | ICD-10-CM

## 2022-08-08 DIAGNOSIS — E785 Hyperlipidemia, unspecified: Secondary | ICD-10-CM

## 2022-08-08 MED ORDER — GLIMEPIRIDE 4 MG PO TABS: 4.0000 mg | ORAL_TABLET | Freq: Every day | ORAL | 3 refills | Status: DC

## 2022-08-08 MED ORDER — EZETIMIBE 10 MG PO TABS: 10.0000 mg | ORAL_TABLET | Freq: Every day | ORAL | 3 refills | Status: DC

## 2022-08-08 NOTE — Telephone Encounter (Signed)
Pt need a refill on glimepiride and ezetimibe sent to [facility]

## 2022-08-08 NOTE — Telephone Encounter (Signed)
Rx sent pt aware 

## 2022-08-24 ENCOUNTER — Ambulatory Visit
Admission: RE | Admit: 2022-08-24 | Discharge: 2022-08-24 | Disposition: A | Discharge status: Home / Self Care | Payer: Medicare Other | Source: Ambulatory Visit | Attending: Family | Admitting: Family

## 2022-08-24 DIAGNOSIS — Z780 Asymptomatic menopausal state: Secondary | ICD-10-CM | POA: Insufficient documentation

## 2022-08-24 DIAGNOSIS — M810 Age-related osteoporosis without current pathological fracture: Secondary | ICD-10-CM | POA: Diagnosis not present

## 2022-08-24 DIAGNOSIS — E119 Type 2 diabetes mellitus without complications: Secondary | ICD-10-CM | POA: Diagnosis not present

## 2022-08-24 DIAGNOSIS — Z13820 Encounter for screening for osteoporosis: Secondary | ICD-10-CM | POA: Insufficient documentation

## 2022-08-29 ENCOUNTER — Ambulatory Visit (INDEPENDENT_AMBULATORY_CARE_PROVIDER_SITE_OTHER): Payer: Medicare Other | Admitting: Family

## 2022-08-29 ENCOUNTER — Encounter: Payer: Self-pay | Admitting: Family

## 2022-08-29 VITALS — BP 120/86 | HR 76 | Temp 97.4°F | Ht 60.0 in | Wt 161.6 lb

## 2022-08-29 DIAGNOSIS — E1140 Type 2 diabetes mellitus with diabetic neuropathy, unspecified: Secondary | ICD-10-CM

## 2022-08-29 DIAGNOSIS — I10 Essential (primary) hypertension: Secondary | ICD-10-CM

## 2022-08-29 DIAGNOSIS — F339 Major depressive disorder, recurrent, unspecified: Secondary | ICD-10-CM

## 2022-08-29 DIAGNOSIS — M810 Age-related osteoporosis without current pathological fracture: Secondary | ICD-10-CM

## 2022-08-29 DIAGNOSIS — E785 Hyperlipidemia, unspecified: Secondary | ICD-10-CM | POA: Diagnosis not present

## 2022-08-29 MED ORDER — DAPAGLIFLOZIN PROPANEDIOL 10 MG PO TABS: ORAL_TABLET | ORAL | 3 refills | Status: DC

## 2022-08-29 MED ORDER — DULOXETINE HCL 60 MG PO CPEP: 60.0000 mg | ORAL_CAPSULE | Freq: Every day | ORAL | 3 refills | Status: DC

## 2022-08-29 MED ORDER — PIOGLITAZONE HCL 15 MG PO TABS: 15.0000 mg | ORAL_TABLET | Freq: Every day | ORAL | 3 refills | Status: DC

## 2022-08-29 MED ORDER — REPATHA SURECLICK 140 MG/ML ~~LOC~~ SOAJ: 140.0000 mg | SUBCUTANEOUS | 3 refills | Status: DC

## 2022-08-29 MED ORDER — ALENDRONATE SODIUM 70 MG PO TABS: 70.0000 mg | ORAL_TABLET | ORAL | 11 refills | Status: DC

## 2022-08-29 NOTE — Assessment & Plan Note (Addendum)
Risk of major fracture 15, hip 3.8%.   Reviewed DEXA report and FRAX risk. Discussed risks of biphosphonates including jaw necrosis, long bone fracture and esophagitis. Patient has no upcoming dental procedures and understand the importance of discussing medication with her dentist as well as letting me know if any dental , oral procedures beyond regular teeth cleaning.  Discussed how to properly take medication.  Discussed that we may consider drug holiday from fosamax if stable at 5 years if low to moderate risk for fracture and if BMD remains stable annually during drug holiday.    Education in regards to medication provided as well as typed up on AVS. advised  appropriate doses of calcium and vitamin D.  Encouraged walking all questions answered. Close follow up to monitor for adverse side effects.

## 2022-08-29 NOTE — Patient Instructions (Signed)
Start Fosamax as discussed for osteoporosis.  Fosamax 70mg taken by mouth ONCE per week. You take this medication with water only 30 minutes prior to first drink/food/medication. You most avoid lying down for at least 30 minutes afterward as this medication can cause inflammation of the esophagus ( esophagitis) if not taken appropriately.   We may consider drug holiday from fosamax if stable at 5 years if low to moderate risk for fracture and if BMD remains stable annually during drug holiday.    We need to follow this and monitor again in 2 more years to see if improved or worsened with another DEXA scan.  For post menopausal women, guidelines recommend a diet with 1200 mg of Calcium per day. If you are eating calcium rich foods, you do not need a calcium supplement. The body better absorbs the calcium that you eat over supplementation. If you do supplement, I recommend not supplementing the full 1200 mg/ day as this can lead to increased risk of cardiovascular disease. I recommend Calcium Citrate over the counter, and you may take a total of 600 to 800 mg per day in divided doses with meals for best absorption.   For bone health, you need adequate vitamin D, and I recommend you supplement as it is harder to do so with diet alone. I recommend cholecalciferol 800 units daily.  Also, please ensure you are following a diet high in calcium -- research shows better outcomes with dietary sources including kale, yogurt, broccolii, cheese, okra, almonds- to name a few.    Also remember that exercise is a great medicine for maintain and preserve bone health. Advise moderate exercise for 30 minutes , 3 times per week.    Alendronate Tablets What is this medication? ALENDRONATE (a LEN droe nate) prevents and treats osteoporosis. It may also be used to treat Paget disease of the bone. It works by making your bones stronger and less likely to break (fracture). It belongs to a group of medications called  bisphosphonates. This medicine may be used for other purposes; ask your health care provider or pharmacist if you have questions. COMMON BRAND NAME(S): Fosamax What should I tell my care team before I take this medication? They need to know if you have any of these conditions: Bleeding disorder Cancer Dental disease Difficulty swallowing Infection (fever, chills, cough, sore throat, pain or trouble passing urine) Kidney disease Low levels of calcium or other minerals in the blood Low red blood cell counts Receiving steroids like dexamethasone or prednisone Stomach or intestine problems Trouble sitting or standing for 30 minutes An unusual or allergic reaction to alendronate, other medications, foods, dyes or preservatives Pregnant or trying to get pregnant Breast-feeding How should I use this medication? Take this medication by mouth with a full glass of water. Take it as directed on the prescription label at the same time every day. Take the dose right after waking up. Do not eat or drink anything before taking it. Do not take it with any other drink except water. Do not chew or crush the tablet. After taking it, do not eat breakfast, drink, or take any other medications or vitamins for at least 30 minutes. Sit or stand up for at least 30 minutes after you take it. Do not lie down. Keep taking it unless your care team tells you to stop. A special MedGuide will be given to you by the pharmacist with each prescription and refill. Be sure to read this information carefully each time. Talk   to your care team about the use of this medication in children. Special care may be needed. Overdosage: If you think you have taken too much of this medicine contact a poison control center or emergency room at once. NOTE: This medicine is only for you. Do not share this medicine with others. What if I miss a dose? If you take your medication once a day, skip it. Take your next dose at the scheduled time the  next morning. Do not take two doses on the same day. If you take your medication once a week, take the missed dose on the morning after you remember. Do not take two doses on the same day. What may interact with this medication? Aluminum hydroxide Antacids Aspirin Calcium supplements Medications for inflammation like ibuprofen, naproxen, and others Iron supplements Magnesium supplements Vitamins with minerals This list may not describe all possible interactions. Give your health care provider a list of all the medicines, herbs, non-prescription drugs, or dietary supplements you use. Also tell them if you smoke, drink alcohol, or use illegal drugs. Some items may interact with your medicine. What should I watch for while using this medication? Visit your care team for regular checks on your progress. It may be some time before you see the benefit from this medication. Some people who take this medication have severe bone, joint, or muscle pain. This medication may also increase your risk for jaw problems or a broken thigh bone. Tell your care team right away if you have severe pain in your jaw, bones, joints, or muscles. Tell you care team if you have any pain that does not go away or that gets worse. Tell your dentist and dental surgeon that you are taking this medication. You should not have major dental surgery while on this medication. See your dentist to have a dental exam and fix any dental problems before starting this medication. Take good care of your teeth while on this medication. Make sure you see your dentist for regular follow-up appointments. You should make sure you get enough calcium and vitamin D while you are taking this medication. Discuss the foods you eat and the vitamins you take with your care team. You may need blood work done while you are taking this medication. What side effects may I notice from receiving this medication? Side effects that you should report to your care  team as soon as possible: Allergic reactions--skin rash, itching, hives, swelling of the face, lips, tongue, or throat Low calcium level--muscle pain or cramps, confusion, tingling, or numbness in the hands or feet Osteonecrosis of the jaw--pain, swelling, or redness in the mouth, numbness of the jaw, poor healing after dental work, unusual discharge from the mouth, visible bones in the mouth Pain or trouble swallowing Severe bone, joint, or muscle pain Stomach bleeding--bloody or black, tar-like stools, vomiting blood or brown material that looks like coffee grounds Side effects that usually do not require medical attention (report to your care team if they continue or are bothersome): Constipation Diarrhea Nausea Stomach pain This list may not describe all possible side effects. Call your doctor for medical advice about side effects. You may report side effects to FDA at 1-800-FDA-1088. Where should I keep my medication? Keep out of the reach of children and pets. Store at room temperature between 15 and 30 degrees C (59 and 86 degrees F). Throw away any unused medication after the expiration date. NOTE: This sheet is a summary. It may not cover all possible   information. If you have questions about this medicine, talk to your doctor, pharmacist, or health care provider.  2022 Elsevier/Gold Standard (2020-10-21 00:00:00)   Osteoporosis Osteoporosis is when the bones get thin and weak. This can cause your bones to break (fracture) more easily. What are the causes? The exact cause of this condition is not known. What increases the risk? Having family members with this condition. Not eating enough healthy foods. Taking certain medicines. Being female. Being age 50 or older. Smoking or using other products that contain nicotine or tobacco, such as e-cigarettes or chewing tobacco. Not exercising. Being of European or Asian ancestry. Having a small body frame. What are the signs or  symptoms? A broken bone might be the first sign, especially if the break results from a fall or injury that usually would not cause a bone to break. Other signs and symptoms include: Pain in the neck or low back. Being hunched over (stooped posture). Getting shorter. How is this treated? Eating more foods with more calcium and vitamin D in them. Doing exercises. Stopping tobacco use. Limiting how much alcohol you drink. Taking medicines to slow bone loss or help make the bones stronger. Taking supplements of calcium and vitamin D every day. Taking medicines to replace chemicals in the body (hormone replacement medicines). Monitoring your levels of calcium and vitamin D. The goal of treatment is to strengthen your bones and lower your risk for a bone break. Follow these instructions at home: Eating and drinking Eat plenty of calcium and vitamin D. These nutrients are good for your bones. Good sources of calcium and vitamin D include: Some fish, such as salmon and tuna. Foods that have calcium and vitamin D added to them (fortified foods), such as some breakfast cereals. Egg yolks. Cheese. Liver.  Activity Do exercises as told by your doctor. Ask your doctor what exercises are safe for you. You should do: Exercises that make your muscles work to hold your body weight up (weight-bearing exercises). These include tai chi, yoga, and walking. Exercises to make your muscles stronger. One example is lifting weights. Lifestyle Do not drink alcohol if: Your doctor tells you not to drink. You are pregnant, may be pregnant, or are planning to become pregnant. If you drink alcohol: Limit how much you use to: 0-1 drink a day for women. 0-2 drinks a day for men. Know how much alcohol is in your drink. In the [place] one drink equals one 12 oz bottle of beer (355 mL), one 5 oz glass of wine (148 mL), or one 1 oz glass of hard liquor (44 mL). Do not smoke or use any products that contain nicotine  or tobacco. If you need help quitting, ask your doctor. Preventing falls Use tools to help you move around (mobility aids) as needed. These include canes, walkers, scooters, and crutches. Keep rooms well-lit. Put away things on the floor that could make you trip. These include cords and rugs. Install safety rails on stairs. Install grab bars in bathrooms. Use rubber mats in slippery areas, like bathrooms. Wear shoes that: Fit you well. Support your feet. Have closed toes. Have rubber soles or low heels. Tell your doctor about all of the medicines you are taking. Some medicines can make you more likely to fall. General instructions Take over-the-counter and prescription medicines only as told by your doctor. Keep all follow-up visits. Contact a doctor if: You have not been tested (screened) for osteoporosis and you are: A woman who is age 65   or older. A man who is age 70 or older. Get help right away if: You fall. You get hurt. Summary Osteoporosis happens when your bones get thin and weak. Weak bones can break (fracture) more easily. Eat plenty of calcium and vitamin D. These are good for your bones. Tell your doctor about all of the medicines that you take. This information is not intended to replace advice given to you by your health care provider. Make sure you discuss any questions you have with your health care provider. Document Revised: 03/25/2020 Document Reviewed: 03/25/2020 Elsevier Patient Education  2022 Elsevier Inc.  

## 2022-08-29 NOTE — Assessment & Plan Note (Addendum)
Elevated today, improved as patient rested in room.  Will follow

## 2022-08-29 NOTE — Progress Notes (Signed)
Subjective:    Patient ID: Brittany [name], female    DOB: 1955-11-12, 66 y.o.   MRN: [id number]  CC: Brittany [name] is a 66 y.o. female who presents today for follow up.   HPI: Feels well today.  No new complaints.  She is taking care of her toddler granddaughter and has been chasing her around suspects reason for why blood pressure is elevated.  She does not take antihypertensive    No h/o ckd.  No trouble swallowing  She has no teeth; she wears dentures.  No upcoming oral surgery.  She is compliant with vitamin D although not sure of dose.  She is not taking calcium  Requests refills today  HISTORY:  Past Medical History:  Diagnosis Date   Diabetes mellitus without complication ([place])    Hyperlipidemia    Hypertension    pt states on medication as prevenative   Obesity    Past Surgical History:  Procedure Laterality Date   ABDOMINAL HYSTERECTOMY     COLONOSCOPY WITH PROPOFOL N/A 06/25/2015   Procedure: COLONOSCOPY WITH PROPOFOL;  Surgeon: Lucilla Lame, MD;  Location: [facility];  Service: Endoscopy;  Laterality: N/A;  Diabetic - oral meds   ELBOW SURGERY Left    HIP SURGERY Right    twice   Family History  Problem Relation Age of Onset   Diabetes Mother    Cancer Father        unknown; '90 % cancer'   Diabetes Sister    Diabetes Brother    Breast cancer Cousin        unknown age at dx.   Thyroid cancer Neg Hx     Allergies: Jardiance [empagliflozin], Lisinopril, Atorvastatin, and Victoza [liraglutide] Current Outpatient Medications on File Prior to Visit  Medication Sig Dispense Refill   Accu-Chek FastClix Lancets MISC Apply topically.     blood glucose meter kit and supplies Dispense based on patient and insurance preference. Use up to four times daily as directed. (FOR ICD-10 E10.9, E11.9). 1 each 0   Blood Glucose Monitoring Suppl (ACCU-CHEK GUIDE) w/Device KIT 1 Device by Does not apply route daily as needed. 1 kit 0   cetirizine (ZYRTEC) 10 MG tablet  Take 10 mg by mouth daily.     cholecalciferol (VITAMIN D3) 25 MCG (1000 UNIT) tablet Take 1,000 Units by mouth daily.     ezetimibe (ZETIA) 10 MG tablet Take 1 tablet (10 mg total) by mouth daily. 90 tablet 3   glimepiride (AMARYL) 4 MG tablet Take 1 tablet (4 mg total) by mouth daily before breakfast. 90 tablet 3   glucose blood (ACCU-CHEK GUIDE) test strip Use as instructed 100 each 12   meloxicam (MOBIC) 7.5 MG tablet TAKE 1 TABLET BY MOUTH EVERY DAY AS NEEDED FOR PAIN 90 tablet 1   methimazole (TAPAZOLE) 5 MG tablet Take 0.5 tablets by mouth daily.     omeprazole (PRILOSEC) 40 MG capsule TAKE 1 CAPSULE BY MOUTH DAILY AS NEEDED. 90 capsule 1   Semaglutide (RYBELSUS) 3 MG TABS Take 3 mg by mouth daily. 30 tablet 2   No current facility-administered medications on file prior to visit.    Social History   Tobacco Use   Smoking status: Never   Smokeless tobacco: Never  Vaping Use   Vaping Use: Never used  Substance Use Topics   Alcohol use: Yes    Alcohol/week: 0.0 standard drinks of alcohol    Comment: 1 times a year-wine   Drug use:  No    Review of Systems  Constitutional:  Negative for chills and fever.  HENT:  Negative for trouble swallowing.   Respiratory:  Negative for cough.   Cardiovascular:  Negative for chest pain and palpitations.  Gastrointestinal:  Negative for nausea and vomiting.      Objective:    BP 120/86   Pulse 76   Temp (!) 97.4 F (36.3 C) (Oral)   Ht 5' (1.524 m)   Wt 161 lb 9.6 oz (73.3 kg)   SpO2 98%   BMI 31.56 kg/m  BP Readings from Last 3 Encounters:  08/29/22 120/86  06/23/22 118/68  12/02/21 128/76   Wt Readings from Last 3 Encounters:  08/29/22 161 lb 9.6 oz (73.3 kg)  06/23/22 162 lb 3.2 oz (73.6 kg)  12/02/21 155 lb (70.3 kg)    Physical Exam Vitals reviewed.  Constitutional:      Appearance: She is well-developed.  Eyes:     Conjunctiva/sclera: Conjunctivae normal.  Cardiovascular:     Rate and Rhythm: Normal rate and  regular rhythm.     Pulses: Normal pulses.     Heart sounds: Normal heart sounds.  Pulmonary:     Effort: Pulmonary effort is normal.     Breath sounds: Normal breath sounds. No wheezing, rhonchi or rales.  Skin:    General: Skin is warm and dry.  Neurological:     Mental Status: She is alert.  Psychiatric:        Speech: Speech normal.        Behavior: Behavior normal.        Thought Content: Thought content normal.        Assessment & Plan:   Problem List Items Addressed This Visit       Cardiovascular and Mediastinum   Essential hypertension    Elevated today, improved as patient rested in room.  Will follow      Relevant Medications   Evolocumab (REPATHA SURECLICK) 008 MG/ML SOAJ     Musculoskeletal and Integument   Osteoporosis - Primary    Risk of major fracture 15, hip 3.8%.   Reviewed DEXA report and FRAX risk. Discussed risks of biphosphonates including jaw necrosis, long bone fracture and esophagitis. Patient has no upcoming dental procedures and understand the importance of discussing medication with her dentist as well as letting me know if any dental , oral procedures beyond regular teeth cleaning.  Discussed how to properly take medication.  Discussed that we may consider drug holiday from fosamax if stable at 5 years if low to moderate risk for fracture and if BMD remains stable annually during drug holiday.    Education in regards to medication provided as well as typed up on AVS. advised  appropriate doses of calcium and vitamin D.  Encouraged walking all questions answered. Close follow up to monitor for adverse side effects.        Relevant Medications   alendronate (FOSAMAX) 70 MG tablet     Other   Depression, recurrent (HCC)   Relevant Medications   DULoxetine (CYMBALTA) 60 MG capsule   Hyperlipidemia   Relevant Medications   Evolocumab (REPATHA SURECLICK) 676 MG/ML SOAJ   Other Visit Diagnoses     Type 2 diabetes mellitus with diabetic  neuropathy, unspecified ([place])       Relevant Medications   dapagliflozin propanediol (FARXIGA) 10 MG TABS tablet   pioglitazone (ACTOS) 15 MG tablet        I have discontinued Brittany [name]  Semaglutide (1 MG/DOSE). I have changed her [place]ade to dapagliflozin propanediol. I have also changed her DULoxetine and pioglitazone. Additionally, I am having her start on alendronate. Lastly, I am having her maintain her Accu-Chek Guide, Accu-Chek Guide, Accu-Chek FastClix Lancets, methimazole, cholecalciferol, cetirizine, meloxicam, omeprazole, blood glucose meter kit and supplies, Rybelsus, glimepiride, ezetimibe, and Repatha SureClick.   Meds ordered this encounter  Medications   Evolocumab (REPATHA SURECLICK) 031 MG/ML SOAJ    Sig: Inject 140 mg into the skin every 14 (fourteen) days.    Dispense:  1 mL    Refill:  3   alendronate (FOSAMAX) 70 MG tablet    Sig: Take 1 tablet (70 mg total) by mouth every 7 (seven) days. Take with a full glass of water on an empty stomach.    Dispense:  4 tablet    Refill:  11   DULoxetine (CYMBALTA) 60 MG capsule    Sig: Take 1 capsule (60 mg total) by mouth daily.    Dispense:  90 capsule    Refill:  3   dapagliflozin propanediol (FARXIGA) 10 MG TABS tablet    Sig: TAKE 1 TABLET BY MOUTH EVERY DAY IN THE MORNING BEFORE BREAKFAST    Dispense:  90 tablet    Refill:  3   pioglitazone (ACTOS) 15 MG tablet    Sig: Take 1 tablet (15 mg total) by mouth daily.    Dispense:  90 tablet    Refill:  3    Return precautions given.   Risks, benefits, and alternatives of the medications and treatment plan prescribed today were discussed, and patient expressed understanding.   Education regarding symptom management and diagnosis given to patient on AVS.  Continue to follow with Burnard Hawthorne, FNP for routine health maintenance.   Linard Millers and I agreed with plan.   Mable Paris, FNP

## 2022-09-19 ENCOUNTER — Other Ambulatory Visit: Payer: Self-pay | Admitting: Pharmacist

## 2022-09-19 NOTE — Progress Notes (Signed)
Care Coordination:  This patient is appearing on the insurance-provided list for being at risk of failing the adherence measure for Statin Use in Persons with Diabetes (SUPD) medications this calendar year.   Patient has a history of myalgia with statins, she is currently stable on Repatha and ezetimibe. Upcoming PCP appointment on Friday.   Please use G72.0 statin myalgia diagnosis code at this visit to exclude patient from this measure. Reminder placed in appointment notes for PCP.   Catie Eppie Gibson, PharmD, BCACP, CPP [facility] [phone]

## 2022-09-20 NOTE — Progress Notes (Signed)
Subjective:    Patient ID: Brittany [name], female    DOB: 07/11/1956, 66 y.o.   MRN: [id number]  CC: Brittany [name] is a 66 y.o. female who presents today for follow up.   HPI: Accompanied by granddaughter today  She is compliant with Repatha, Zetia   DM- compliant with  rybelus 52m however very expensive. She is tolerating without constipation, nausea.  She is pleased with weight loss. She will start [employer] in the new year.  She is compliant Farxiga 10 mg daily, pioglitazone 15 mg daily, glimepiride 4 mg daily qam with breakfast.   No hypoglycemic episodes  HISTORY:  Past Medical History:  Diagnosis Date   Diabetes mellitus without complication ([place]    Hyperlipidemia    Hypertension    pt states on medication as prevenative   Obesity    Past Surgical History:  Procedure Laterality Date   ABDOMINAL HYSTERECTOMY     COLONOSCOPY WITH PROPOFOL N/A 06/25/2015   Procedure: COLONOSCOPY WITH PROPOFOL;  Surgeon: DLucilla Lame MD;  [facility]  Service: Endoscopy;  Laterality: N/A;  Diabetic - oral meds   ELBOW SURGERY Left    HIP SURGERY Right    twice   Family History  Problem Relation Age of Onset   Diabetes Mother    Cancer Father        unknown; '90 % cancer'   Diabetes Sister    Diabetes Brother    Breast cancer Cousin        unknown age at dx.   Thyroid cancer Neg Hx     Allergies: Jardiance [empagliflozin], Lisinopril, Atorvastatin, and Victoza [liraglutide] Current Outpatient Medications on File Prior to Visit  Medication Sig Dispense Refill   Accu-Chek FastClix Lancets MISC Apply topically.     alendronate (FOSAMAX) 70 MG tablet Take 1 tablet (70 mg total) by mouth every 7 (seven) days. Take with a full glass of water on an empty stomach. 4 tablet 11   blood glucose meter kit and supplies Dispense based on patient and insurance preference. Use up to four times daily as directed. (FOR ICD-10 E10.9, E11.9). 1 each 0   Blood Glucose Monitoring  Suppl (ACCU-CHEK GUIDE) w/Device KIT 1 Device by Does not apply route daily as needed. 1 kit 0   cetirizine (ZYRTEC) 10 MG tablet Take 10 mg by mouth daily.     cholecalciferol (VITAMIN D3) 25 MCG (1000 UNIT) tablet Take 1,000 Units by mouth daily.     dapagliflozin propanediol (FARXIGA) 10 MG TABS tablet TAKE 1 TABLET BY MOUTH EVERY DAY IN THE MORNING BEFORE BREAKFAST 90 tablet 3   DULoxetine (CYMBALTA) 60 MG capsule Take 1 capsule (60 mg total) by mouth daily. 90 capsule 3   Evolocumab (REPATHA SURECLICK) 1299MG/ML SOAJ Inject 140 mg into the skin every 14 (fourteen) days. 1 mL 3   ezetimibe (ZETIA) 10 MG tablet Take 1 tablet (10 mg total) by mouth daily. 90 tablet 3   glimepiride (AMARYL) 4 MG tablet Take 1 tablet (4 mg total) by mouth daily before breakfast. 90 tablet 3   glucose blood (ACCU-CHEK GUIDE) test strip Use as instructed 100 each 12   meloxicam (MOBIC) 7.5 MG tablet TAKE 1 TABLET BY MOUTH EVERY DAY AS NEEDED FOR PAIN 90 tablet 1   methimazole (TAPAZOLE) 5 MG tablet Take 0.5 tablets by mouth daily.     omeprazole (PRILOSEC) 40 MG capsule TAKE 1 CAPSULE BY MOUTH DAILY AS NEEDED. 90 capsule 1  pioglitazone (ACTOS) 15 MG tablet Take 1 tablet (15 mg total) by mouth daily. 90 tablet 3   No current facility-administered medications on file prior to visit.    Social History   Tobacco Use   Smoking status: Never   Smokeless tobacco: Never  Vaping Use   Vaping Use: Never used  Substance Use Topics   Alcohol use: Yes    Alcohol/week: 0.0 standard drinks of alcohol    Comment: 1 times a year-wine   Drug use: No    Review of Systems  Constitutional:  Negative for chills and fever.  Respiratory:  Negative for cough.   Cardiovascular:  Negative for chest pain and palpitations.  Gastrointestinal:  Negative for nausea and vomiting.      Objective:    BP 136/82   Pulse (!) 109   Temp 98.2 F (36.8 C) (Oral)   Wt 157 lb 6.4 oz (71.4 kg)   SpO2 97%   BMI 30.74 kg/m  BP  Readings from Last 3 Encounters:  09/22/22 136/82  08/29/22 120/86  06/23/22 118/68   Wt Readings from Last 3 Encounters:  09/22/22 157 lb 6.4 oz (71.4 kg)  08/29/22 161 lb 9.6 oz (73.3 kg)  06/23/22 162 lb 3.2 oz (73.6 kg)    Physical Exam Vitals reviewed.  Constitutional:      Appearance: She is well-developed.  Eyes:     Conjunctiva/sclera: Conjunctivae normal.  Cardiovascular:     Rate and Rhythm: Normal rate and regular rhythm.     Pulses: Normal pulses.     Heart sounds: Normal heart sounds.  Pulmonary:     Effort: Pulmonary effort is normal.     Breath sounds: Normal breath sounds. No wheezing, rhonchi or rales.  Skin:    General: Skin is warm and dry.  Neurological:     Mental Status: She is alert.  Psychiatric:        Speech: Speech normal.        Behavior: Behavior normal.        Thought Content: Thought content normal.        Assessment & Plan:   Problem List Items Addressed This Visit       Endocrine   Diabetic retinopathy associated with type 2 diabetes mellitus ([place])   Relevant Medications   Semaglutide (RYBELSUS) 7 MG TABS   DM (diabetes mellitus) with complications ([place]) - Primary    Lab Results  Component Value Date   HGBA1C 7.9 (A) 09/22/2022  Uncontrolled.  We discussed options and agreed that we would not increase glimepiride.  Could potentially consider increasing pioglitazone to 86m. Preference would be to arrange patient assistance for patient through our clinical pharmacist patient to remain on GLP-1 agonist.  She has done quite well on Rybelsus.  Samples of Rybelsus 7 mg provided today for 30 days.  I have contacted pharmacy in regards to patient assistance program      Relevant Medications   Semaglutide (RYBELSUS) 7 MG TABS     Musculoskeletal and Integument   Statin myopathy    LDL 72.  history of myalgia with statins, she is currently stable on Repatha and ezetimibe       Other Visit Diagnoses     Elevated glucose        Relevant Orders   POCT HgB A1C (Completed)        I have discontinued KKarsten Fells [name]'s Rybelsus. I am also having her start on Rybelsus. Additionally, I am having her maintain her  Accu-Chek Guide, Accu-Chek Guide, Accu-Chek FastClix Lancets, methimazole, cholecalciferol, cetirizine, meloxicam, omeprazole, blood glucose meter kit and supplies, glimepiride, ezetimibe, Repatha SureClick, alendronate, DULoxetine, dapagliflozin propanediol, and pioglitazone.   Meds ordered this encounter  Medications   Semaglutide (RYBELSUS) 7 MG TABS    Sig: Take 7 mg by mouth daily.    Dispense:  30 tablet    Refill:  2    Order Specific Question:   Supervising Provider    Answer:   Crecencio Mc [[id number]]    Return precautions given.   Risks, benefits, and alternatives of the medications and treatment plan prescribed today were discussed, and patient expressed understanding.   Education regarding symptom management and diagnosis given to patient on AVS.  Continue to follow with Burnard Hawthorne, FNP for routine health maintenance.   Linard Millers and I agreed with plan.   Mable Paris, FNP

## 2022-09-20 NOTE — Assessment & Plan Note (Addendum)
LDL 72.  history of myalgia with statins, she is currently stable on Repatha and ezetimibe

## 2022-09-22 ENCOUNTER — Ambulatory Visit (INDEPENDENT_AMBULATORY_CARE_PROVIDER_SITE_OTHER): Payer: Medicare Other | Admitting: Family

## 2022-09-22 ENCOUNTER — Encounter: Payer: Self-pay | Admitting: Family

## 2022-09-22 ENCOUNTER — Other Ambulatory Visit: Payer: Self-pay | Admitting: Family

## 2022-09-22 VITALS — BP 128/72 | HR 112 | Temp 98.2°F | Wt 157.4 lb

## 2022-09-22 DIAGNOSIS — E118 Type 2 diabetes mellitus with unspecified complications: Secondary | ICD-10-CM

## 2022-09-22 DIAGNOSIS — T466X5A Adverse effect of antihyperlipidemic and antiarteriosclerotic drugs, initial encounter: Secondary | ICD-10-CM | POA: Diagnosis not present

## 2022-09-22 DIAGNOSIS — Z23 Encounter for immunization: Secondary | ICD-10-CM

## 2022-09-22 DIAGNOSIS — G720 Drug-induced myopathy: Secondary | ICD-10-CM | POA: Diagnosis not present

## 2022-09-22 DIAGNOSIS — E11319 Type 2 diabetes mellitus with unspecified diabetic retinopathy without macular edema: Secondary | ICD-10-CM | POA: Diagnosis not present

## 2022-09-22 DIAGNOSIS — R7309 Other abnormal glucose: Secondary | ICD-10-CM | POA: Diagnosis not present

## 2022-09-22 LAB — POCT GLYCOSYLATED HEMOGLOBIN (HGB A1C): Hemoglobin A1C: 7.9 % — AB (ref 4.0–5.6)

## 2022-09-22 MED ORDER — RYBELSUS 7 MG PO TABS: 7.0000 mg | ORAL_TABLET | Freq: Every day | ORAL | 2 refills | Status: DC

## 2022-09-22 NOTE — Assessment & Plan Note (Addendum)
Lab Results  Component Value Date   HGBA1C 7.9 (A) 09/22/2022  Uncontrolled.  We discussed options and agreed that we would not increase glimepiride.  Could potentially consider increasing pioglitazone to 30mg . Preference would be to arrange patient assistance for patient through our clinical pharmacist patient to remain on GLP-1 agonist.  She has done quite well on Rybelsus.  Samples of Rybelsus 7 mg provided today for 30 days.  I have contacted pharmacy in regards to patient assistance program

## 2022-09-22 NOTE — Patient Instructions (Signed)
Start rybelsus 7mg    We will work on patient assistance in new year.   Nice to see you!

## 2022-09-22 NOTE — Addendum Note (Signed)
Addended by: [place], Alante Tolan on: 09/22/2022 10:00 AM   Modules accepted: Orders

## 2022-10-02 DIAGNOSIS — H52223 Regular astigmatism, bilateral: Secondary | ICD-10-CM | POA: Diagnosis not present

## 2022-10-02 DIAGNOSIS — H5203 Hypermetropia, bilateral: Secondary | ICD-10-CM | POA: Diagnosis not present

## 2022-10-02 DIAGNOSIS — E113293 Type 2 diabetes mellitus with mild nonproliferative diabetic retinopathy without macular edema, bilateral: Secondary | ICD-10-CM | POA: Diagnosis not present

## 2022-10-02 DIAGNOSIS — H524 Presbyopia: Secondary | ICD-10-CM | POA: Diagnosis not present

## 2022-10-02 LAB — HM DIABETES EYE EXAM

## 2022-10-03 ENCOUNTER — Other Ambulatory Visit: Payer: Self-pay | Admitting: Family

## 2022-10-03 DIAGNOSIS — E118 Type 2 diabetes mellitus with unspecified complications: Secondary | ICD-10-CM

## 2022-10-11 ENCOUNTER — Telehealth: Payer: Self-pay | Admitting: Family

## 2022-10-11 DIAGNOSIS — E118 Type 2 diabetes mellitus with unspecified complications: Secondary | ICD-10-CM

## 2022-10-11 MED ORDER — OZEMPIC (0.25 OR 0.5 MG/DOSE) 2 MG/1.5ML ~~LOC~~ SOPN: 0.2500 mg | PEN_INJECTOR | SUBCUTANEOUS | 3 refills | Status: DC

## 2022-10-11 NOTE — Telephone Encounter (Signed)
  Rybelsus does not have a side effect of leg cramps so I think that is probably unlikely.  However if she preferred to go on once a week Ozempic, that is reasonable.  I have sent in for her to resume.  In 4 weeks time, she needs to call the office so I can send in an increased dose

## 2022-10-11 NOTE — Telephone Encounter (Signed)
Pt called stating she stopped the RYBELSUS because it was giving her  leg cramps and headaches. Pt want to know if she can go back on the ozempic

## 2022-10-12 ENCOUNTER — Other Ambulatory Visit: Payer: Self-pay | Admitting: *Deleted

## 2022-10-12 DIAGNOSIS — E118 Type 2 diabetes mellitus with unspecified complications: Secondary | ICD-10-CM

## 2022-10-12 MED ORDER — OZEMPIC (0.25 OR 0.5 MG/DOSE) 2 MG/1.5ML ~~LOC~~ SOPN: 0.2500 mg | PEN_INJECTOR | SUBCUTANEOUS | 3 refills | Status: DC

## 2022-10-12 NOTE — Telephone Encounter (Signed)
[facility] [place] [facility]

## 2022-10-12 NOTE — Telephone Encounter (Signed)
Patient called and said the Ozempic was sent to the wrong pharmacy, [facility].

## 2022-10-13 ENCOUNTER — Telehealth: Payer: Self-pay | Admitting: Family

## 2022-10-13 ENCOUNTER — Other Ambulatory Visit: Payer: Self-pay | Admitting: Family

## 2022-10-13 DIAGNOSIS — M25552 Pain in left hip: Secondary | ICD-10-CM

## 2022-10-13 NOTE — Telephone Encounter (Signed)
Patient called and is asking if office has any Ozempic samples.

## 2022-10-17 NOTE — Telephone Encounter (Signed)
Pt would like to know if she will be able to get samples of Ozempic because she can not afford to pay $94 a month.

## 2022-10-18 ENCOUNTER — Telehealth: Payer: Self-pay

## 2022-10-18 DIAGNOSIS — E118 Type 2 diabetes mellitus with unspecified complications: Secondary | ICD-10-CM

## 2022-10-18 NOTE — Telephone Encounter (Signed)
Referral for pt assistance

## 2022-10-20 ENCOUNTER — Telehealth: Payer: Self-pay

## 2022-10-20 NOTE — Telephone Encounter (Signed)
Spoke to pt in regards to Ozempic, pt stated that she is not taking the Ozempic or the Rybelsus due to leg cramps and she is having numbness and tingling in her feet and would like a referral to get that checked on as well.

## 2022-10-20 NOTE — Telephone Encounter (Signed)
Spoke to pt and she stated that she would call back on Tues and make an appt to discuss symptoms

## 2022-10-20 NOTE — Telephone Encounter (Signed)
Telephone note

## 2022-10-20 NOTE — Telephone Encounter (Signed)
Jenate ,  What dose of ozempic is she on? We should have sample of her if on 0.25mg  or 0.5mg   She has medication consult with Catie 11/01/22 so hopefully we will come up with long term plan that is financially affordable.     Catie,   She has consult 11/01/22 with you fyi

## 2022-10-24 NOTE — Telephone Encounter (Signed)
noted 

## 2022-10-26 ENCOUNTER — Telehealth: Payer: Self-pay | Admitting: Family

## 2022-10-26 NOTE — Telephone Encounter (Signed)
Pt was returning [place] call back. Read notes to her, she said she's unable to make an appt right now, she's out of town. She will do when she's back.

## 2022-10-26 NOTE — Telephone Encounter (Signed)
LMTCB

## 2022-10-26 NOTE — Telephone Encounter (Signed)
Spoke to patient to inform her that we have a sample of Ozempic in the office for her until she can get hers. Pt stated that she would pick up next week because she is out of town .

## 2022-10-30 NOTE — Telephone Encounter (Signed)
Medication Samples have been provided to the patient.  Drug name: Ozempic      Strength: 2mg /35ml    Qty: 1  LOT: [id number]   Exp.Date: 04/21/2024  Dosing instructions: Inject 0.25 mg into the skin once a week. After 4 weeks, increase to 0.5mg  [place] qwk   The patient has been instructed regarding the correct time, dose, and frequency of taking this medication, including desired effects and most common side effects.   Cannon Kettle 11:11 AM 10/30/2022

## 2022-11-01 ENCOUNTER — Other Ambulatory Visit: Payer: Medicare Other | Admitting: Pharmacist

## 2022-11-01 ENCOUNTER — Other Ambulatory Visit: Payer: Self-pay | Admitting: Family

## 2022-11-01 ENCOUNTER — Other Ambulatory Visit (HOSPITAL_COMMUNITY): Payer: Self-pay

## 2022-11-01 DIAGNOSIS — E785 Hyperlipidemia, unspecified: Secondary | ICD-10-CM

## 2022-11-01 MED ORDER — REPATHA SURECLICK 140 MG/ML ~~LOC~~ SOAJ: 140.0000 mg | SUBCUTANEOUS | 3 refills | Status: DC

## 2022-11-01 NOTE — Progress Notes (Signed)
Care Coordination Call  Completed PA for Repatha via Cover My Meds.   Completed application for [place] [facility] Fatima Sanger. Approved 10/02/22-10/02/23; BIN [id number]; PCN [id number]; Group [id number]; ID: [id number]   Information pended on order. Will collaborate with PCP to send to pharmacy.   Catie Hedwig Morton, PharmD, [place], [place] [facility] [phone]

## 2022-11-01 NOTE — Progress Notes (Addendum)
Agree with repatha. I have filled rx.  Mable Paris, NP   Mailed PAP application  FOR [place] (Farxiga) and Novo (Ozempic)  to patient home. Upon receiving application I will fax md portion.

## 2022-11-01 NOTE — Progress Notes (Signed)
11/01/2022 Name: Brittany [name] MRN: [id number] DOB: [birth year]  Chief Complaint  Patient presents with   Medication Management    Medication Access    Brittany [name] is a 67 y.o. year old female who presented for a telephone visit.   They were referred to the pharmacist by their PCP for assistance in managing medication access.   Subjective:  Care Team: Primary Care Provider: Burnard Hawthorne, FNP ; Next Scheduled Visit: not yet scheduled  Medication Access/Adherence  [id number] [place] [place] - [address] [place] [address] Phone: [phone] Fax: [phone]  [id number] [place] [place] [facility] [place] [address] [place] [address] Phone: [phone] Fax: [phone]   Patient reports affordability concerns with their medications: Yes , patient is on disability and receives ~$1,500/mo. She did NOT meet the financial requirements for full Medicaid. Has issues affording Repatha, Farxiga, and Ozempic.  Patient reports access/transportation concerns to their pharmacy: No  Patient reports adherence concerns with their medications:  No     Diabetes:  Current medications: Farxiga 10 mg daily, Ozempic 0.25 mg weekly (tolerated 1 dose without issues), glimepiride 4 mg daily, pioglitazone 15 mg daily  Current medication access support: Patient is requesting assistance with [place] and Ozempic  Hyperlipidemia/ASCVD Risk Reduction  Current lipid lowering medications: ezetimibe 10 mg daily, Repatha 140 mg weekly (has not taken in many months) Medications tried in the past: statin intolerant  Current medication access support: Requesting assistance with Repatha. Will help patient apply for [employer].    Health Maintenance  Health Maintenance Due  Topic Date Due   Medicare Annual Wellness (AWV)  Never done   Diabetic kidney evaluation - eGFR measurement  12/02/2022   Diabetic  kidney evaluation - Urine ACR  12/02/2022     Objective: Lab Results  Component Value Date   HGBA1C 7.9 (A) 09/22/2022    Lab Results  Component Value Date   CREATININE 0.69 12/02/2021   BUN 25 12/02/2021   NA 141 12/02/2021   K 4.0 12/02/2021   CL 105 12/02/2021   CO2 24 12/02/2021    Lab Results  Component Value Date   CHOL 156 12/02/2021   HDL 65 12/02/2021   LDLCALC 72 12/02/2021   TRIG 105 12/02/2021   CHOLHDL 2.4 12/02/2021    Medications Reviewed Today     Reviewed by Pauletta Browns, [place] (Pharmacist) on 11/01/22 at 1108  Med List Status: <None>   Medication Order Taking? Sig Documenting Provider Last Dose Status Informant  Accu-Chek FastClix Lancets MISC [id number]  Apply topically. [provider]  Active   alendronate (FOSAMAX) 70 MG tablet [id number] Yes Take 1 tablet (70 mg total) by mouth every 7 (seven) days. Take with a full glass of water on an empty stomach. Burnard Hawthorne, FNP Taking Active   blood glucose meter kit and supplies [id number]  Dispense based on patient and insurance preference. Use up to four times daily as directed. (FOR ICD-10 E10.9, E11.9). Burnard Hawthorne, FNP  Active   Blood Glucose Monitoring Suppl (ACCU-CHEK GUIDE) w/Device KIT [id number]  1 Device by Does not apply route daily as needed. Burnard Hawthorne, FNP  Active   cetirizine (ZYRTEC) 10 MG tablet [id number] Yes Take 10 mg by mouth daily. [provider] Taking Active   cholecalciferol (VITAMIN D3) 25 MCG (1000 UNIT) tablet [id number] Yes Take 1,000 Units by mouth daily. [provider] Taking Active   dapagliflozin propanediol (FARXIGA) 10 MG TABS tablet [id number] Yes TAKE 1 TABLET BY MOUTH EVERY DAY IN THE MORNING BEFORE BREAKFAST Burnard Hawthorne, FNP Taking Active   DULoxetine (CYMBALTA) 60 MG capsule [id number] Yes Take 1 capsule (60 mg total) by mouth daily. Burnard Hawthorne, FNP Taking Active   Evolocumab [facility] SURECLICK) 834 MG/ML Darden Palmer  [id number] No Inject 140 mg into the skin every 14 (fourteen) days.  Patient not taking: Reported on 11/01/2022   Burnard Hawthorne, FNP Not Taking Active   ezetimibe (ZETIA) 10 MG tablet [id number] Yes Take 1 tablet (10 mg total) by mouth daily. Burnard Hawthorne, FNP Taking Active   glimepiride (AMARYL) 4 MG tablet [id number] Yes Take 1 tablet (4 mg total) by mouth daily before breakfast. Burnard Hawthorne, FNP Taking Active   glucose blood (ACCU-CHEK GUIDE) test strip [id number]  USE TO TEST TWICE DAILY. Burnard Hawthorne, FNP  Active   meloxicam (MOBIC) 7.5 MG tablet [id number] No TAKE 1 TABLET BY MOUTH EVERY DAY AS NEEDED FOR PAIN  Patient not taking: Reported on 11/01/2022   Kennyth Arnold, FNP Not Taking Active   methimazole (TAPAZOLE) 5 MG tablet [id number] No Take 0.5 tablets by mouth daily.  Patient not taking: Reported on 11/01/2022   [provider] Not Taking Active   omeprazole (PRILOSEC) 40 MG capsule [id number] Yes TAKE 1 CAPSULE BY MOUTH DAILY AS NEEDED. Burnard Hawthorne, FNP Taking Active   pioglitazone (ACTOS) 15 MG tablet [id number] Yes Take 1 tablet (15 mg total) by mouth daily. Burnard Hawthorne, FNP Taking Active   pregabalin (LYRICA) 50 MG capsule [id number] Yes Take 50 mg by mouth 3 (three) times daily. [provider]  Active   Semaglutide,0.25 or 0.5MG /DOS, (OZEMPIC, 0.25 OR 0.5 MG/DOSE,) 2 MG/1.5ML SOPN [id number] Yes Inject 0.25 mg into the skin once a week. After 4 weeks, increase to 0.5mg  [place] qwk. Burnard Hawthorne, FNP Taking Active   Med List Note Leeanne [place], [place] 01/10/17 1348): **[employer] EMPLOYEE**             Assessment/Plan:   Diabetes: - Currently uncontrolled based on A1c.  - Reviewed long term cardiovascular and renal outcomes of uncontrolled blood sugar - Reviewed goal A1c, goal fasting, and goal 2 hour post prandial glucose - Meets financial criteria for Ozempic and [place] patient assistance program through [employer] and  [employer]. Will collaborate with provider, CPhT, and patient to pursue assistance.    Hyperlipidemia/ASCVD Risk Reduction: - Currently uncontrolled, LDL 72 in February 2023.  - Reviewed long term complications of uncontrolled cholesterol - Meets financial criteria for Repatha patient assistance program through [employer]. Will collaborate with provider, CPhT, and patient to pursue assistance.   Medication Access: - Provided patients with contact information for [facility] so patient can apply for Extra Help [facility] #: [phone])  Follow Up Plan:  Pharmacist: 11/20/2022  Joseph Art, Pharm.D. PGY-2 Ambulatory Care Pharmacy Resident 11/01/2022 11:56 AM

## 2022-11-08 ENCOUNTER — Other Ambulatory Visit: Payer: Self-pay | Admitting: Family

## 2022-11-08 DIAGNOSIS — K2100 Gastro-esophageal reflux disease with esophagitis, without bleeding: Secondary | ICD-10-CM

## 2022-11-15 ENCOUNTER — Other Ambulatory Visit (HOSPITAL_COMMUNITY): Payer: Self-pay

## 2022-11-20 ENCOUNTER — Other Ambulatory Visit: Payer: Medicare HMO | Admitting: Pharmacist

## 2022-11-21 ENCOUNTER — Other Ambulatory Visit: Payer: Medicare HMO | Admitting: Pharmacist

## 2022-11-21 NOTE — Progress Notes (Signed)
Care Coordination Call  Contacted patient to follow up on Medication Access. She received paperwork in the mail for [employer] and [employer]) patient assistance, but has not completed and returned yet. She notes there was not a return envelope in there.   She will complete and return to [facility], and let them know to give to me. Please scan to my email, I will pass to appropriate team member.  [facility] They ran [place], applied [employer]. Copay is $0, they will order and fill.   Catie Hedwig Morton, PharmD, [place], [place] [facility] [phone]

## 2022-12-28 ENCOUNTER — Telehealth: Payer: Self-pay | Admitting: Family

## 2022-12-28 NOTE — Telephone Encounter (Signed)
Contacted Brittany [name] to schedule their annual wellness visit. Appointment made for 01/05/2023.  Thank you,  [name] Direct dial  [phone]

## 2023-01-05 ENCOUNTER — Ambulatory Visit (INDEPENDENT_AMBULATORY_CARE_PROVIDER_SITE_OTHER): Payer: Medicare HMO

## 2023-01-05 VITALS — Ht 60.0 in | Wt 157.0 lb

## 2023-01-05 DIAGNOSIS — Z1231 Encounter for screening mammogram for malignant neoplasm of breast: Secondary | ICD-10-CM | POA: Diagnosis not present

## 2023-01-05 DIAGNOSIS — Z0000 Encounter for general adult medical examination without abnormal findings: Secondary | ICD-10-CM | POA: Diagnosis not present

## 2023-01-05 NOTE — Progress Notes (Signed)
Subjective:   Brittany [name] is a 67 y.o. female who presents for an Initial Medicare Annual Wellness Visit.  Review of Systems    No ROS.  Medicare Wellness Virtual Visit.  Visual/audio telehealth visit, UTA vital signs.   See social history for additional risk factors.   Cardiac Risk Factors include: advanced age (>29men, >54 women)     Objective:    Today's Vitals   01/05/23 0950  Weight: 157 lb (71.2 kg)   Body mass index is 30.66 kg/m.     01/05/2023    9:52 AM 04/13/2016    8:22 AM 12/02/2015    8:38 AM 06/25/2015    7:27 AM 06/16/2015    2:40 PM 05/20/2015    9:13 AM  Advanced Directives  Does Patient Have a Medical Advance Directive? No No No No No No  Would patient like information on creating a medical advance directive? No - Patient declined No - patient declined information   No - patient declined information No - patient declined information    Current Medications (verified) Outpatient Encounter Medications as of 01/05/2023  Medication Sig   Accu-Chek FastClix Lancets MISC Apply topically.   alendronate (FOSAMAX) 70 MG tablet Take 1 tablet (70 mg total) by mouth every 7 (seven) days. Take with a full glass of water on an empty stomach.   blood glucose meter kit and supplies Dispense based on patient and insurance preference. Use up to four times daily as directed. (FOR ICD-10 E10.9, E11.9).   Blood Glucose Monitoring Suppl (ACCU-CHEK GUIDE) w/Device KIT 1 Device by Does not apply route daily as needed.   cetirizine (ZYRTEC) 10 MG tablet Take 10 mg by mouth daily.   cholecalciferol (VITAMIN D3) 25 MCG (1000 UNIT) tablet Take 1,000 Units by mouth daily.   dapagliflozin propanediol (FARXIGA) 10 MG TABS tablet TAKE 1 TABLET BY MOUTH EVERY DAY IN THE MORNING BEFORE BREAKFAST   DULoxetine (CYMBALTA) 60 MG capsule Take 1 capsule (60 mg total) by mouth daily.   Evolocumab (REPATHA SURECLICK) [id number] MG/ML SOAJ Inject 140 mg into the skin every 14 (fourteen) days.   ezetimibe  (ZETIA) 10 MG tablet Take 1 tablet (10 mg total) by mouth daily.   glimepiride (AMARYL) 4 MG tablet Take 1 tablet (4 mg total) by mouth daily before breakfast.   glucose blood (ACCU-CHEK GUIDE) test strip USE TO TEST TWICE DAILY.   meloxicam (MOBIC) 7.5 MG tablet TAKE 1 TABLET BY MOUTH EVERY DAY AS NEEDED FOR PAIN (Patient not taking: Reported on 11/01/2022)   methimazole (TAPAZOLE) 5 MG tablet Take 0.5 tablets by mouth daily. (Patient not taking: Reported on 11/01/2022)   omeprazole (PRILOSEC) 40 MG capsule TAKE 1 CAPSULE BY MOUTH EVERY DAY AS NEEDED   pioglitazone (ACTOS) 15 MG tablet Take 1 tablet (15 mg total) by mouth daily.   pregabalin (LYRICA) 50 MG capsule Take 50 mg by mouth 3 (three) times daily.   Semaglutide,0.25 or 0.5MG /DOS, (OZEMPIC, 0.25 OR 0.5 MG/DOSE,) 2 MG/1.5ML SOPN Inject 0.25 mg into the skin once a week. After 4 weeks, increase to 0.5mg  [place] qwk.   No facility-administered encounter medications on file as of 01/05/2023.    Allergies (verified) Jardiance [empagliflozin], Lisinopril, Atorvastatin, and Victoza [liraglutide]   History: Past Medical History:  Diagnosis Date   Diabetes mellitus without complication ([place])    Hyperlipidemia    Hypertension    pt states on medication as prevenative   Obesity    Past Surgical History:  Procedure Laterality  Date   ABDOMINAL HYSTERECTOMY     COLONOSCOPY WITH PROPOFOL N/A 06/25/2015   Procedure: COLONOSCOPY WITH PROPOFOL;  Surgeon: Lucilla Lame, MD;  Location: [facility];  Service: Endoscopy;  Laterality: N/A;  Diabetic - oral meds   ELBOW SURGERY Left    HIP SURGERY Right    twice   Family History  Problem Relation Age of Onset   Diabetes Mother    Cancer Father        unknown; '90 % cancer'   Diabetes Sister    Diabetes Brother    Breast cancer Cousin        unknown age at dx.   Thyroid cancer Neg Hx    Social History   Socioeconomic History   Marital status: Single    Spouse name: Not on file   Number  of children: Not on file   Years of education: Not on file   Highest education level: Not on file  Occupational History   Not on file  Tobacco Use   Smoking status: Never   Smokeless tobacco: Never  Vaping Use   Vaping Use: Never used  Substance and Sexual Activity   Alcohol use: Yes    Alcohol/week: 0.0 standard drinks of alcohol    Comment: 1 times a year-wine   Drug use: No   Sexual activity: Not Currently  Other Topics Concern   Not on file  Social History Narrative    from [place] and then moved to [place].    Works at [employer]- retired 10/2021   Youngest son has drug addiction   Plans to keep step daughter's daughter , 3  months      Lives with ex husband, Carloyn Manner.    2 dogs, 15 chickens.       Enjoys Firefighter, fairs, yard Press photographer.                Social Determinants of Health   Financial Resource Strain: Low Risk  (01/05/2023)   Overall Financial Resource Strain (CARDIA)    Difficulty of Paying Living Expenses: Not hard at all  Food Insecurity: No Food Insecurity (01/05/2023)   Hunger Vital Sign    Worried About Running Out of Food in the Last Year: Never true    Ran Out of Food in the Last Year: Never true  Transportation Needs: No Transportation Needs (01/05/2023)   PRAPARE - Hydrologist (Medical): No    Lack of Transportation (Non-Medical): No  Physical Activity: Not on file  Stress: No Stress Concern Present (01/05/2023)   [place]    Feeling of Stress : Not at all  Social Connections: Unknown (01/05/2023)   Social Connection and Isolation Panel [NHANES]    Frequency of Communication with Friends and Family: More than three times a week    Frequency of Social Gatherings with Friends and Family: More than three times a week    Attends Religious Services: More than 4 times per year    Active Member of [employer] or Organizations: Not on file    Attends Theatre manager Meetings: Not on file    Marital Status: Not on file    Tobacco Counseling Counseling given: Not Answered   Clinical Intake:  Pre-visit preparation completed: Yes        Diabetes: Yes (Followed by pcp)  How often do you need to have someone help you when you read instructions, pamphlets, or other  written materials from your doctor or pharmacy?: 1 - Never  Nutrition Risk Assessment: Has the patient had any N/V/D within the last 2 months?  No  Does the patient have any non-healing wounds?  No  Has the patient had any unintentional weight loss or weight gain?  No   Diabetes: Is the patient diabetic?  Yes  If diabetic, was a CBG obtained today?  No Did the patient bring in their glucometer from home?  No  How often do you monitor your CBG's? Only when I feel like .   Financial Strains and Diabetes Management: Are you having any financial strains with the device, your supplies or your medication? No .  Does the patient want to be seen by Chronic Care Management for management of their diabetes?  No  Would the patient like to be referred to a Nutritionist or for Diabetic Management?  No   Interpreter Needed?: No      Activities of Daily Living    01/05/2023    9:53 AM  In your present state of health, do you have any difficulty performing the following activities:  Hearing? 0  Vision? 0  Difficulty concentrating or making decisions? 0  Walking or climbing stairs? 0  Dressing or bathing? 0  Doing errands, shopping? 0  Preparing Food and eating ? N  Using the Toilet? N  In the past six months, have you accidently leaked urine? N  Do you have problems with loss of bowel control? N  Managing your Medications? N  Managing your Finances? N  Housekeeping or managing your Housekeeping? N    Patient Care Team: Burnard Hawthorne, FNP as PCP - General (Family Medicine) Osker Mason, RPH-CPP (Pharmacist)  Indicate any recent Medical Services you may  have received from other than [facility] providers in the past year (date may be approximate).     Assessment:   This is a routine wellness examination for Brittany [name].  I connected with  Brittany [name] on 01/05/23 by a audio enabled telemedicine application and verified that I am speaking with the correct person using two identifiers.  Patient Location: Home  Provider Location: Office/Clinic  I discussed the limitations of evaluation and management by telemedicine. The patient expressed understanding and agreed to proceed.   Hearing/Vision screen Hearing Screening - Comments:: Patient is able to hear conversational tones without difficulty.  No issues reported.   Vision Screening - Comments:: Followed by Dr. Matilde Sprang Wears corrective lenses They have seen their ophthalmologist in the last 12 months.    Dietary issues and exercise activities discussed: Current Exercise Habits: Home exercise routine, Intensity: Mild Regular diet;  monitors carb intake    Goals Addressed             This Visit's Progress    Healthy lifestyle       Healthy diet Stay active       Depression Screen    01/05/2023    9:51 AM 09/22/2022    9:04 AM 08/29/2022   10:42 AM 08/29/2022   10:21 AM 06/07/2021    8:43 AM 03/07/2021   10:14 AM 11/30/2020    9:47 AM  PHQ 2/9 Scores  PHQ - 2 Score 0 0 1 0 0 0 0  PHQ- 9 Score  0 1  1 1      Fall Risk    01/05/2023    9:53 AM 09/22/2022    9:03 AM 08/29/2022   10:21 AM 06/07/2021    8:38 AM  11/30/2020    9:47 AM  Fall Risk   Falls in the past year? 0 0 0 1 1  Number falls in past yr: 0 0 0 1 0  Injury with Fall? 0 0 0 0 0  Risk for fall due to :  No Fall Risks No Fall Risks History of fall(s)   Follow up Falls evaluation completed;Falls prevention discussed Falls evaluation completed Falls evaluation completed Falls evaluation completed Falls evaluation completed    FALL RISK PREVENTION PERTAINING TO THE HOME: Home free of loose throw rugs in walkways, pet beds,  electrical cords, etc? Yes  Adequate lighting in your home to reduce risk of falls? Yes   ASSISTIVE DEVICES UTILIZED TO PREVENT FALLS: Life alert? No  Use of a cane, walker or w/c? No  Grab bars in the bathroom? No  Shower chair or bench in shower? No  Elevated toilet seat or a handicapped toilet? No   TIMED UP AND GO: Was the test performed? No .   Cognitive Function:        01/05/2023    9:54 AM  6CIT Screen  What Year? 0 points  What month? 0 points  What time? 0 points  Count back from 20 0 points  Months in reverse 0 points  Repeat phrase 0 points  Total Score 0 points    Immunizations Immunization History  Administered Date(s) Administered   Influenza Split 1955/12/28, 11/23/1964, 07/20/2018   Influenza, Seasonal, Injecte, Preservative Fre 07/23/2013   Influenza,inj,Quad PF,6+ Mos 07/02/2017, 07/20/2018, 07/25/2019, 08/17/2020, 07/04/2021   Influenza-Unspecified 06/23/2014, 08/07/2016, 07/02/2017   Moderna Sars-Covid-2 Vaccination 06/06/2020, 07/08/2020   PNEUMOCOCCAL CONJUGATE-20 06/23/2022   Pneumococcal Conjugate-13 12/02/2015   Pneumococcal Polysaccharide-23 06/13/2010   Tdap 04/10/2012, 09/22/2022    Shingrix Completed?: No.    Education has been provided regarding the importance of this vaccine. Patient has been advised to call insurance company to determine out of pocket expense if they have not yet received this vaccine. Advised may also receive vaccine at local pharmacy or Health Dept. Verbalized acceptance and understanding.  Screening Tests Health Maintenance  Topic Date Due   INFLUENZA VACCINE  01/21/2023 (Originally 05/23/2022)   COVID-19 Vaccine (3 - 2023-24 season) 01/21/2023 (Originally 06/23/2022)   Diabetic kidney evaluation - eGFR measurement  02/21/2023 (Originally 12/02/2022)   Diabetic kidney evaluation - Urine ACR  02/21/2023 (Originally 12/02/2022)   FOOT EXAM  02/21/2023 (Originally 12/02/2022)   Zoster Vaccines- Shingrix (1 of 2) 04/07/2023  (Originally 01/17/2007)   PAP SMEAR-Modifier  12/12/2023 (Originally 01/16/1978)   COLONOSCOPY (Pts 45-34yrs Insurance coverage will need to be confirmed)  01/05/2024 (Originally 06/24/2020)   MAMMOGRAM  03/16/2023   HEMOGLOBIN A1C  03/24/2023   OPHTHALMOLOGY EXAM  10/03/2023   Medicare Annual Wellness (AWV)  01/05/2024   DTaP/Tdap/Td (3 - Td or Tdap) 09/22/2032   Pneumonia Vaccine 50+ Years old  Completed   DEXA SCAN  Completed   Hepatitis C Screening  Completed   HIV Screening  Completed   HPV VACCINES  Aged Out    Health Maintenance  There are no preventive care reminders to display for this patient.  Colonoscopy- declined.   Mammogram- ordered.   Foot Exam- due. Followed by pcp.  Lung Cancer Screening: (Low Dose CT Chest recommended if Age 75-80 years, 30 pack-year currently smoking OR have quit w/in 15years.) does not qualify.   Hepatitis C Screening: Completed 04/2013.   Vision Screening: Recommended annual ophthalmology exams for early detection of glaucoma and other disorders of  the eye.  Dental Screening: Recommended annual dental exams for proper oral hygiene  Community Resource Referral / Chronic Care Management: CRR required this visit?  No   CCM required this visit?  No      Plan:     I have personally reviewed and noted the following in the patient's chart:   Medical and social history Use of alcohol, tobacco or illicit drugs  Current medications and supplements including opioid prescriptions. Patient is not currently taking opioid prescriptions. Functional ability and status Nutritional status Physical activity Advanced directives List of other physicians Hospitalizations, surgeries, and ER visits in previous 12 months Vitals Screenings to include cognitive, depression, and falls Referrals and appointments  In addition, I have reviewed and discussed with patient certain preventive protocols, quality metrics, and best practice recommendations. A  written personalized care plan for preventive services as well as general preventive health recommendations were provided to patient.     Leta Jungling, LPN   [id number]

## 2023-01-05 NOTE — Patient Instructions (Addendum)
Brittany [name] , Thank you for taking time to come for your Medicare Wellness Visit. I appreciate your ongoing commitment to your health goals. Please review the following plan we discussed and let me know if I can assist you in the future.   These are the goals we discussed:  Goals      Healthy lifestyle     Healthy diet Stay active        This is a list of the screening recommended for you and due dates:  Health Maintenance  Topic Date Due   Flu Shot  01/21/2023*   COVID-19 Vaccine (3 - 2023-24 season) 01/21/2023*   Yearly kidney function blood test for diabetes  02/21/2023*   Yearly kidney health urinalysis for diabetes  02/21/2023*   Complete foot exam   02/21/2023*   Zoster (Shingles) Vaccine (1 of 2) 04/07/2023*   Pap Smear  12/12/2023*   Colon Cancer Screening  01/05/2024*   Mammogram  03/16/2023   Hemoglobin A1C  03/24/2023   Eye exam for diabetics  10/03/2023   Medicare Annual Wellness Visit  01/05/2024   DTaP/Tdap/Td vaccine (3 - Td or Tdap) 09/22/2032   Pneumonia Vaccine  Completed   DEXA scan (bone density measurement)  Completed   Hepatitis C Screening: USPSTF Recommendation to screen - Ages 56-79 yo.  Completed   HIV Screening  Completed   HPV Vaccine  Aged Out  *Topic was postponed. The date shown is not the original due date.    Conditions/risks identified: none new  Next appointment: Follow up in one year for your annual wellness visit    Preventive Care 65 Years and Older, Female Preventive care refers to lifestyle choices and visits with your health care provider that can promote health and wellness. What does preventive care include? A yearly physical exam. This is also called an annual well check. Dental exams once or twice a year. Routine eye exams. Ask your health care provider how often you should have your eyes checked. Personal lifestyle choices, including: Daily care of your teeth and gums. Regular physical activity. Eating a healthy  diet. Avoiding tobacco and drug use. Limiting alcohol use. Practicing safe sex. Taking low-dose aspirin every day. Taking vitamin and mineral supplements as recommended by your health care provider. What happens during an annual well check? The services and screenings done by your health care provider during your annual well check will depend on your age, overall health, lifestyle risk factors, and family history of disease. Counseling  Your health care provider may ask you questions about your: Alcohol use. Tobacco use. Drug use. Emotional well-being. Home and relationship well-being. Sexual activity. Eating habits. History of falls. Memory and ability to understand (cognition). Work and work Statistician. Reproductive health. Screening  You may have the following tests or measurements: Height, weight, and BMI. Blood pressure. Lipid and cholesterol levels. These may be checked every 5 years, or more frequently if you are over 25 years old. Skin check. Lung cancer screening. You may have this screening every year starting at age 59 if you have a 30-pack-year history of smoking and currently smoke or have quit within the past 15 years. Fecal occult blood test (FOBT) of the stool. You may have this test every year starting at age 3. Flexible sigmoidoscopy or colonoscopy. You may have a sigmoidoscopy every 5 years or a colonoscopy every 10 years starting at age 67. Hepatitis C blood test. Hepatitis B blood test. Sexually transmitted disease (STD) testing. Diabetes screening. This is  done by checking your blood sugar (glucose) after you have not eaten for a while (fasting). You may have this done every 1-3 years. Bone density scan. This is done to screen for osteoporosis. You may have this done starting at age 69. Mammogram. This may be done every 1-2 years. Talk to your health care provider about how often you should have regular mammograms. Talk with your health care provider about  your test results, treatment options, and if necessary, the need for more tests. Vaccines  Your health care provider may recommend certain vaccines, such as: Influenza vaccine. This is recommended every year. Tetanus, diphtheria, and acellular pertussis (Tdap, Td) vaccine. You may need a Td booster every 10 years. Zoster vaccine. You may need this after age 53. Pneumococcal 13-valent conjugate (PCV13) vaccine. One dose is recommended after age 59. Pneumococcal polysaccharide (PPSV23) vaccine. One dose is recommended after age 51. Talk to your health care provider about which screenings and vaccines you need and how often you need them. This information is not intended to replace advice given to you by your health care provider. Make sure you discuss any questions you have with your health care provider. Document Released: 11/05/2015 Document Revised: 06/28/2016 Document Reviewed: 08/10/2015 Elsevier Interactive Patient Education  2017 [employer] Prevention in the Home Falls can cause injuries. They can happen to people of all ages. There are many things you can do to make your home safe and to help prevent falls. What can I do on the outside of my home? Regularly fix the edges of walkways and driveways and fix any cracks. Remove anything that might make you trip as you walk through a door, such as a raised step or threshold. Trim any bushes or trees on the path to your home. Use bright outdoor lighting. Clear any walking paths of anything that might make someone trip, such as rocks or tools. Regularly check to see if handrails are loose or broken. Make sure that both sides of any steps have handrails. Any raised decks and porches should have guardrails on the edges. Have any leaves, snow, or ice cleared regularly. Use sand or salt on walking paths during winter. Clean up any spills in your garage right away. This includes oil or grease spills. What can I do in the bathroom? Use  night lights. Install grab bars by the toilet and in the tub and shower. Do not use towel bars as grab bars. Use non-skid mats or decals in the tub or shower. If you need to sit down in the shower, use a plastic, non-slip stool. Keep the floor dry. Clean up any water that spills on the floor as soon as it happens. Remove soap buildup in the tub or shower regularly. Attach bath mats securely with double-sided non-slip rug tape. Do not have throw rugs and other things on the floor that can make you trip. What can I do in the bedroom? Use night lights. Make sure that you have a light by your bed that is easy to reach. Do not use any sheets or blankets that are too big for your bed. They should not hang down onto the floor. Have a firm chair that has side arms. You can use this for support while you get dressed. Do not have throw rugs and other things on the floor that can make you trip. What can I do in the kitchen? Clean up any spills right away. Avoid walking on wet floors. Keep items that you  use a lot in easy-to-reach places. If you need to reach something above you, use a strong step stool that has a grab bar. Keep electrical cords out of the way. Do not use floor polish or wax that makes floors slippery. If you must use wax, use non-skid floor wax. Do not have throw rugs and other things on the floor that can make you trip. What can I do with my stairs? Do not leave any items on the stairs. Make sure that there are handrails on both sides of the stairs and use them. Fix handrails that are broken or loose. Make sure that handrails are as long as the stairways. Check any carpeting to make sure that it is firmly attached to the stairs. Fix any carpet that is loose or worn. Avoid having throw rugs at the top or bottom of the stairs. If you do have throw rugs, attach them to the floor with carpet tape. Make sure that you have a light switch at the top of the stairs and the bottom of the  stairs. If you do not have them, ask someone to add them for you. What else can I do to help prevent falls? Wear shoes that: Do not have high heels. Have rubber bottoms. Are comfortable and fit you well. Are closed at the toe. Do not wear sandals. If you use a stepladder: Make sure that it is fully opened. Do not climb a closed stepladder. Make sure that both sides of the stepladder are locked into place. Ask someone to hold it for you, if possible. Clearly mark and make sure that you can see: Any grab bars or handrails. First and last steps. Where the edge of each step is. Use tools that help you move around (mobility aids) if they are needed. These include: Canes. Walkers. Scooters. Crutches. Turn on the lights when you go into a dark area. Replace any light bulbs as soon as they burn out. Set up your furniture so you have a clear path. Avoid moving your furniture around. If any of your floors are uneven, fix them. If there are any pets around you, be aware of where they are. Review your medicines with your doctor. Some medicines can make you feel dizzy. This can increase your chance of falling. Ask your doctor what other things that you can do to help prevent falls. This information is not intended to replace advice given to you by your health care provider. Make sure you discuss any questions you have with your health care provider. Document Released: 08/05/2009 Document Revised: 03/16/2016 Document Reviewed: 11/13/2014 Elsevier Interactive Patient Education  2017 [employer].

## 2023-01-16 ENCOUNTER — Telehealth: Payer: Self-pay

## 2023-01-16 ENCOUNTER — Other Ambulatory Visit: Payer: Medicare HMO | Admitting: Pharmacist

## 2023-01-16 NOTE — Telephone Encounter (Signed)
I have receive pt pages and sending providers pages  FOR PAP ([facility]) AND ([place]) to [facility] [place] office at [phone]. Please have provider to review, sign and fax to [phone] ATTENTION Mohab Ashby L.  [facility] Patient Advocate [phone]

## 2023-01-16 NOTE — Progress Notes (Signed)
01/16/2023 Name: Brittany [name] MRN: [id number] DOB: Nov 15, 1955  Chief Complaint  Patient presents with   Medication Management   Diabetes   Hyperlipidemia    SHECID [name] is a 67 y.o. year old female who presented for a telephone visit.   They were referred to the pharmacist by their PCP for assistance in managing medication access.   Subjective:  Care Team: Primary Care Provider: Burnard Hawthorne, FNP ; Next Scheduled Visit: none scheduled  Medication Access/Adherence  Current Pharmacy:  [facility] [address] [place] [address] Phone: [phone] Fax: [phone]   Patient reports affordability concerns with their medications: Yes  Patient reports access/transportation concerns to their pharmacy: No  Patient reports adherence concerns with their medications:  No     Diabetes:  Current medications: Farxiga 10 mg daily, Ozempic 0.25 mg weekly (tolerated 1 dose without issues), glimepiride 4 mg daily, pioglitazone 15 mg daily   Current medication access support: patient notes she returned her portion of patient assistance applications, but has not heard form either company  Hyperlipidemia/ASCVD Risk Reduction  Current lipid lowering medications: ezetimibe 10 mg daily, Repatha 140 mg weekly Medications tried in the past: statin intolerant due to myalgias   Current medication access support: Approved for [place] copay support through 10/23/23   Objective:  Lab Results  Component Value Date   HGBA1C 7.9 (A) 09/22/2022    Lab Results  Component Value Date   CREATININE 0.69 12/02/2021   BUN 25 12/02/2021   NA 141 12/02/2021   K 4.0 12/02/2021   CL 105 12/02/2021   CO2 24 12/02/2021    Lab Results  Component Value Date   CHOL 156 12/02/2021   HDL 65 12/02/2021   LDLCALC 72 12/02/2021   TRIG 105 12/02/2021   CHOLHDL 2.4 12/02/2021    Medications Reviewed Today     Reviewed  by Osker Mason, RPH-CPP (Pharmacist) on 01/16/23 at [phone]  Med List Status: <None>   Medication Order Taking? Sig Documenting Provider Last Dose Status Informant  Accu-Chek FastClix Lancets MISC [id number]  Apply topically. [provider]  Active   alendronate (FOSAMAX) 70 MG tablet [id number] Yes Take 1 tablet (70 mg total) by mouth every 7 (seven) days. Take with a full glass of water on an empty stomach. Burnard Hawthorne, FNP Taking Active   blood glucose meter kit and supplies [id number] Yes Dispense based on patient and insurance preference. Use up to four times daily as directed. (FOR ICD-10 E10.9, E11.9). Burnard Hawthorne, FNP Taking Active   Blood Glucose Monitoring Suppl (ACCU-CHEK GUIDE) w/Device KIT [id number] Yes 1 Device by Does not apply route daily as needed. Burnard Hawthorne, FNP Taking Active   cholecalciferol (VITAMIN D3) 25 MCG (1000 UNIT) tablet [id number] Yes Take 1,000 Units by mouth daily. [provider] Taking Active   dapagliflozin propanediol (FARXIGA) 10 MG TABS tablet [id number] Yes TAKE 1 TABLET BY MOUTH EVERY DAY IN THE MORNING BEFORE BREAKFAST Burnard Hawthorne, FNP Taking Active   DULoxetine (CYMBALTA) 60 MG capsule [id number] Yes Take 1 capsule (60 mg total) by mouth daily. Burnard Hawthorne, FNP Taking Active   Evolocumab [facility] SURECLICK) [id number] MG/ML Darden Palmer [id number] Yes Inject 140 mg into the skin every 14 (fourteen) days. Burnard Hawthorne, FNP Taking Active   ezetimibe (ZETIA) 10 MG tablet [id number] Yes Take 1 tablet (10 mg total) by mouth daily. Burnard Hawthorne, FNP Taking  Active   fexofenadine (ALLEGRA) 180 MG tablet [id number] Yes Take 180 mg by mouth daily. [provider] Taking Active   glimepiride (AMARYL) 4 MG tablet [id number] Yes Take 1 tablet (4 mg total) by mouth daily before breakfast. Burnard Hawthorne, FNP Taking Active   glucose blood (ACCU-CHEK GUIDE) test strip [id number] Yes USE TO TEST TWICE DAILY. Burnard Hawthorne, FNP Taking Active   methimazole (TAPAZOLE) 5 MG tablet [id number] No Take 0.5 tablets by mouth daily.  Patient not taking: Reported on 11/01/2022   [provider] Not Taking Active   omeprazole (PRILOSEC) 40 MG capsule [id number] Yes TAKE 1 CAPSULE BY MOUTH EVERY DAY AS NEEDED Burnard Hawthorne, FNP Taking Active   pioglitazone (ACTOS) 15 MG tablet [id number] Yes Take 1 tablet (15 mg total) by mouth daily. Burnard Hawthorne, FNP Taking Active   pregabalin (LYRICA) 50 MG capsule [id number] Yes Take 50 mg by mouth 3 (three) times daily. [provider] Taking Active   Semaglutide,0.25 or 0.5MG /DOS, (OZEMPIC, 0.25 OR 0.5 MG/DOSE,) 2 MG/1.5ML SOPN [id number] Yes Inject 0.25 mg into the skin once a week. After 4 weeks, increase to 0.5mg  Fabens qwk. Burnard Hawthorne, FNP Taking Active   Med List Note Leeanne Rio, [place] 01/10/17 1348): **[employer] EMPLOYEE**              Assessment/Plan:   Diabetes: - Currently uncontrolled - Marketing executive. She has not received provider portion of applications yet, will send again. Patient notes she has a supply of both Ozempic and Farxiga at home.  - Recommend to check glucose twice daily, fasting and 2 hour post prandial   Hyperlipidemia/ASCVD Risk Reduction: - Currently uncontrolled but anticipate improvement - Recommend to continue current regimen at this time  Follow Up Plan: phone call in 6 weeks  Catie Hedwig Morton, PharmD, [place], [facility] [phone]

## 2023-01-19 NOTE — Telephone Encounter (Signed)
Received notification from [employer] regarding approval for [facility]. Patient assistance approved from 01/18/2023 to 10/23/2023.  Phone: [phone]  [facility] [phone]    SCANNING IN LETTER OF APPROVAL TO MEDIA

## 2023-01-23 NOTE — Telephone Encounter (Signed)
Received notification from [employer] regarding approval for [place]. Patient assistance approved from 01/22/2023 to 10/23/2023.  Phone: [phone]  SCANNING IN LETTER OF APPROVAL TO [place] [facility] Patient [facility] [phone]

## 2023-01-24 ENCOUNTER — Encounter: Payer: Self-pay | Admitting: Family

## 2023-01-24 ENCOUNTER — Ambulatory Visit (INDEPENDENT_AMBULATORY_CARE_PROVIDER_SITE_OTHER): Payer: Medicare HMO | Admitting: Family

## 2023-01-24 VITALS — BP 126/84 | HR 98 | Temp 97.7°F | Ht 62.0 in | Wt 153.6 lb

## 2023-01-24 DIAGNOSIS — Z136 Encounter for screening for cardiovascular disorders: Secondary | ICD-10-CM

## 2023-01-24 DIAGNOSIS — Z8639 Personal history of other endocrine, nutritional and metabolic disease: Secondary | ICD-10-CM | POA: Diagnosis not present

## 2023-01-24 DIAGNOSIS — R7309 Other abnormal glucose: Secondary | ICD-10-CM

## 2023-01-24 DIAGNOSIS — E785 Hyperlipidemia, unspecified: Secondary | ICD-10-CM | POA: Diagnosis not present

## 2023-01-24 DIAGNOSIS — E118 Type 2 diabetes mellitus with unspecified complications: Secondary | ICD-10-CM

## 2023-01-24 DIAGNOSIS — Z13220 Encounter for screening for lipoid disorders: Secondary | ICD-10-CM

## 2023-01-24 LAB — POCT GLYCOSYLATED HEMOGLOBIN (HGB A1C): Hemoglobin A1C: 8.1 % — AB (ref 4.0–5.6)

## 2023-01-24 MED ORDER — SEMAGLUTIDE (1 MG/DOSE) 4 MG/3ML ~~LOC~~ SOPN: 1.0000 mg | PEN_INJECTOR | SUBCUTANEOUS | 3 refills | Status: DC

## 2023-01-24 NOTE — Patient Instructions (Signed)
Increase Ozempic to 1 mg once weekly at home.  Please let me know if any nausea or constipation.  Please confirm all medications on this print out are accurate with your home list.

## 2023-01-24 NOTE — Assessment & Plan Note (Signed)
Pending lipid panel. Continue repatha 140mg .

## 2023-01-24 NOTE — Progress Notes (Signed)
Assessment & Plan:  DM (diabetes mellitus) with complications Assessment & Plan: Lab Results  Component Value Date   HGBA1C 8.1 (A) 01/24/2023   Suboptimal control. Increase ozempic to 1mg . Continue farxiga 10mg , actos 15mg  , glimepiride 4mg  qd. Consider discontinuation of actos at follow up due to risk hypoglycemia if tolerating escalation of ozempic.   Orders: -     Semaglutide (1 MG/DOSE); Inject 1 mg as directed once a week.  Dispense: 3 mL; Refill: 3 -     Microalbumin / creatinine urine ratio -     Comprehensive metabolic panel  Elevated glucose -     POCT glycosylated hemoglobin (Hb A1C)  Encounter for lipid screening for cardiovascular disease -     Lipid panel  History of vitamin D deficiency -     VITAMIN D 25 Hydroxy (Vit-D Deficiency, Fractures)  Hyperlipidemia, unspecified hyperlipidemia type Assessment & Plan: Pending lipid panel. Continue repatha 140mg .       Return precautions given.   Risks, benefits, and alternatives of the medications and treatment plan prescribed today were discussed, and patient expressed understanding.   Education regarding symptom management and diagnosis given to patient on AVS either electronically or printed.  Return in about 3 months (around 04/25/2023).  Mable Paris, FNP  Subjective:    Patient ID: LUCELIA BLAIZE, female    DOB: Jun 03, 1956, 67 y.o.   MRN: [id number]  CC: MICKENNA CHING is a 67 y.o. female who presents today for follow up.   HPI: Feels well today No new concerns     She is compliant with farxiga 10mg , actos 15mg  , glimepiride 4mg  qd and ozempic 0.5mg . She is tolerated medications without nausea, constipation.    Allergies: Jardiance [empagliflozin], Lisinopril, Atorvastatin, and Victoza [liraglutide] Current Outpatient Medications on File Prior to Visit  Medication Sig Dispense Refill   Accu-Chek FastClix Lancets MISC Apply topically.     alendronate (FOSAMAX) 70 MG tablet Take 1 tablet (70 mg  total) by mouth every 7 (seven) days. Take with a full glass of water on an empty stomach. 4 tablet 11   blood glucose meter kit and supplies Dispense based on patient and insurance preference. Use up to four times daily as directed. (FOR ICD-10 E10.9, E11.9). 1 each 0   Blood Glucose Monitoring Suppl (ACCU-CHEK GUIDE) w/Device KIT 1 Device by Does not apply route daily as needed. 1 kit 0   cholecalciferol (VITAMIN D3) 25 MCG (1000 UNIT) tablet Take 1,000 Units by mouth daily.     dapagliflozin propanediol (FARXIGA) 10 MG TABS tablet TAKE 1 TABLET BY MOUTH EVERY DAY IN THE MORNING BEFORE BREAKFAST 90 tablet 3   DULoxetine (CYMBALTA) 60 MG capsule Take 1 capsule (60 mg total) by mouth daily. 90 capsule 3   Evolocumab (REPATHA SURECLICK) [id number] MG/ML SOAJ Inject 140 mg into the skin every 14 (fourteen) days. 6 mL 3   ezetimibe (ZETIA) 10 MG tablet Take 1 tablet (10 mg total) by mouth daily. 90 tablet 3   fexofenadine (ALLEGRA) 180 MG tablet Take 180 mg by mouth daily.     glimepiride (AMARYL) 4 MG tablet Take 1 tablet (4 mg total) by mouth daily before breakfast. 90 tablet 3   glucose blood (ACCU-CHEK GUIDE) test strip USE TO TEST TWICE DAILY. 50 strip 3   methimazole (TAPAZOLE) 5 MG tablet Take 0.5 tablets by mouth daily.     omeprazole (PRILOSEC) 40 MG capsule TAKE 1 CAPSULE BY MOUTH EVERY DAY AS NEEDED 90 capsule 1  pioglitazone (ACTOS) 15 MG tablet Take 1 tablet (15 mg total) by mouth daily. 90 tablet 3   pregabalin (LYRICA) 50 MG capsule Take 50 mg by mouth 3 (three) times daily.     Prenatal Vit-Fe Fumarate-FA (PRENATAL VITAMINS PO) Take by mouth.     No current facility-administered medications on file prior to visit.    Review of Systems    Objective:    BP 126/84   Pulse 98   Temp 97.7 F (36.5 C) (Oral)   Ht 5\' 2"  (1.575 m)   Wt 153 lb 9.6 oz (69.7 kg)   SpO2 97%   BMI 28.09 kg/m  BP Readings from Last 3 Encounters:  01/24/23 126/84  09/22/22 128/72  08/29/22 120/86   Wt  Readings from Last 3 Encounters:  01/24/23 153 lb 9.6 oz (69.7 kg)  01/05/23 157 lb (71.2 kg)  09/22/22 157 lb 6.4 oz (71.4 kg)    Physical Exam Vitals reviewed.  Constitutional:      Appearance: She is well-developed.  Eyes:     Conjunctiva/sclera: Conjunctivae normal.  Cardiovascular:     Rate and Rhythm: Normal rate and regular rhythm.     Pulses: Normal pulses.     Heart sounds: Normal heart sounds.  Pulmonary:     Effort: Pulmonary effort is normal.     Breath sounds: Normal breath sounds. No wheezing, rhonchi or rales.  Skin:    General: Skin is warm and dry.  Neurological:     Mental Status: She is alert.  Psychiatric:        Speech: Speech normal.        Behavior: Behavior normal.        Thought Content: Thought content normal.

## 2023-01-24 NOTE — Assessment & Plan Note (Signed)
Lab Results  Component Value Date   HGBA1C 8.1 (A) 01/24/2023   Suboptimal control. Increase ozempic to 1mg . Continue farxiga 10mg , actos 15mg  , glimepiride 4mg  qd. Consider discontinuation of actos at follow up due to risk hypoglycemia if tolerating escalation of ozempic.

## 2023-01-25 LAB — COMPREHENSIVE METABOLIC PANEL
ALT: 25 U/L (ref 0–35)
AST: 17 U/L (ref 0–37)
Albumin: 4.5 g/dL (ref 3.5–5.2)
Alkaline Phosphatase: 61 U/L (ref 39–117)
BUN: 16 mg/dL (ref 6–23)
CO2: 25 mEq/L (ref 19–32)
Calcium: 9.2 mg/dL (ref 8.4–10.5)
Chloride: 107 mEq/L (ref 96–112)
Creatinine, Ser: 0.65 mg/dL (ref 0.40–1.20)
GFR: 92 mL/min (ref >60.00)
Glucose, Bld: 102 mg/dL — ABNORMAL HIGH (ref 70–99)
Potassium: 3.7 mEq/L (ref 3.5–5.1)
Sodium: 142 mEq/L (ref 135–145)
Total Bilirubin: 0.5 mg/dL (ref 0.2–1.2)
Total Protein: 7 g/dL (ref 6.0–8.3)

## 2023-01-25 LAB — MICROALBUMIN / CREATININE URINE RATIO
Creatinine,U: 94.6 mg/dL
Microalb Creat Ratio: 1.1 mg/g (ref 0.0–30.0)
Microalb, Ur: 1 mg/dL (ref 0.0–1.9)

## 2023-01-25 LAB — LIPID PANEL
Cholesterol: 118 mg/dL (ref 0–200)
HDL: 61.8 mg/dL (ref >39.00)
LDL Cholesterol: 34 mg/dL (ref 0–99)
NonHDL: 55.75
Total CHOL/HDL Ratio: 2
Triglycerides: 107 mg/dL (ref 0.0–149.0)
VLDL: 21.4 mg/dL (ref 0.0–40.0)

## 2023-01-25 LAB — VITAMIN D 25 HYDROXY (VIT D DEFICIENCY, FRACTURES): VITD: 36.83 ng/mL (ref 30.00–100.00)

## 2023-02-01 ENCOUNTER — Telehealth: Payer: Self-pay

## 2023-02-01 NOTE — Telephone Encounter (Signed)
Pt  notified that her Ozempic 1 mg (4boxes) from Patient Assistance is here and she can pick up anytime between 1 pm -5 pm  M-F

## 2023-02-02 NOTE — Telephone Encounter (Signed)
Medication has been picked up.  

## 2023-02-06 DIAGNOSIS — E113293 Type 2 diabetes mellitus with mild nonproliferative diabetic retinopathy without macular edema, bilateral: Secondary | ICD-10-CM | POA: Diagnosis not present

## 2023-02-18 DIAGNOSIS — S92351A Displaced fracture of fifth metatarsal bone, right foot, initial encounter for closed fracture: Secondary | ICD-10-CM | POA: Diagnosis not present

## 2023-02-18 DIAGNOSIS — S93401A Sprain of unspecified ligament of right ankle, initial encounter: Secondary | ICD-10-CM | POA: Diagnosis not present

## 2023-02-20 LAB — HM DIABETES EYE EXAM

## 2023-02-25 ENCOUNTER — Other Ambulatory Visit: Payer: Self-pay | Admitting: Family

## 2023-02-25 DIAGNOSIS — K2100 Gastro-esophageal reflux disease with esophagitis, without bleeding: Secondary | ICD-10-CM

## 2023-03-02 DIAGNOSIS — S93401D Sprain of unspecified ligament of right ankle, subsequent encounter: Secondary | ICD-10-CM | POA: Diagnosis not present

## 2023-03-02 DIAGNOSIS — S92351D Displaced fracture of fifth metatarsal bone, right foot, subsequent encounter for fracture with routine healing: Secondary | ICD-10-CM | POA: Diagnosis not present

## 2023-03-06 ENCOUNTER — Other Ambulatory Visit: Payer: Medicare HMO | Admitting: Pharmacist

## 2023-03-06 ENCOUNTER — Telehealth: Payer: Self-pay | Admitting: Pharmacist

## 2023-03-06 NOTE — Telephone Encounter (Signed)
Novo Nordisk refill form completed for patient's increased dose of Ozempic 1 mg weekly. Paperwork completed, provided to staff for [name]'s signature. Please fax to [employer] when completed.

## 2023-03-06 NOTE — Progress Notes (Signed)
03/06/2023 Name: Brittany [name] MRN: [id number] DOB: 18-Jan-1956  Chief Complaint  Patient presents with   Diabetes   Medication Management   Hypertension   Hyperlipidemia    Brittany [name] is a 67 y.o. year old female who presented for a telephone visit.   They were referred to the pharmacist by their PCP for assistance in managing diabetes and medication access.    Subjective:  Care Team: Primary Care Provider: Allegra Grana, FNP ; Next Scheduled Visit:   Medication Access/Adherence  [id number] [place] [place] - [address] [place] [address] Phone: [phone] Fax: [phone]   Patient reports affordability concerns with their medications: No  Patient reports access/transportation concerns to their pharmacy: No  Patient reports adherence concerns with their medications:  No     Diabetes:  Current medications: pioglitazone 15 mg daily, Ozempic 1 mg weekly, glimepiride 4 mg daily, Farxiga 10 mg daily  Current glucose readings: patient notes she is not checking. Denies any GI upset, hypoglycemia.   Hyperlipidemia/ASCVD Risk Reduction  Current lipid lowering medications: Repatha 140 mg every 2 weeks, ezetimibe 10 mg daily  Objective:  Lab Results  Component Value Date   HGBA1C 8.1 (A) 01/24/2023    Lab Results  Component Value Date   CREATININE 0.65 01/24/2023   BUN 16 01/24/2023   NA 142 01/24/2023   K 3.7 01/24/2023   CL 107 01/24/2023   CO2 25 01/24/2023    Lab Results  Component Value Date   CHOL 118 01/24/2023   HDL 61.80 01/24/2023   LDLCALC 34 01/24/2023   TRIG 107.0 01/24/2023   CHOLHDL 2 01/24/2023    Medications Reviewed Today     Reviewed by Brittany [name], RPH-CPP (Pharmacist) on 03/06/23 at 1159  Med List Status: <None>   Medication Order Taking? Sig Documenting Provider Last Dose Status Informant  Accu-Chek FastClix Lancets MISC [id number]  Apply topically.  [provider]  Active   alendronate (FOSAMAX) 70 MG tablet [id number] Yes Take 1 tablet (70 mg total) by mouth every 7 (seven) days. Take with a full glass of water on an empty stomach. Brittany Grana, FNP Taking Active   blood glucose meter kit and supplies [id number]  Dispense based on patient and insurance preference. Use up to four times daily as directed. (FOR ICD-10 E10.9, E11.9). Brittany Grana, FNP  Active   Blood Glucose Monitoring Suppl (ACCU-CHEK GUIDE) w/Device KIT [id number]  1 Device by Does not apply route daily as needed. Brittany Grana, FNP  Active   cholecalciferol (VITAMIN D3) 25 MCG (1000 UNIT) tablet [id number] Yes Take 1,000 Units by mouth daily. [provider] Taking Active   dapagliflozin propanediol (FARXIGA) 10 MG TABS tablet [id number] Yes TAKE 1 TABLET BY MOUTH EVERY DAY IN THE MORNING BEFORE BREAKFAST Brittany Grana, FNP Taking Active   DULoxetine (CYMBALTA) 60 MG capsule [id number] Yes Take 1 capsule (60 mg total) by mouth daily. Brittany Grana, FNP Taking Active   Evolocumab [facility] SURECLICK) 140 MG/ML [place]ory [place] [id number] Yes Inject 140 mg into the skin every 14 (fourteen) days. Brittany Grana, FNP Taking Active   ezetimibe (ZETIA) 10 MG tablet [id number] Yes Take 1 tablet (10 mg total) by mouth daily. Brittany Grana, FNP Taking Active   fexofenadine (Brittany) 180 MG tablet [id number]  Take 180 mg by mouth daily. [provider]  Active   glimepiride (AMARYL) 4 MG tablet [id number] Yes  Take 1 tablet (4 mg total) by mouth daily before breakfast. Brittany Grana, FNP Taking Active   glucose blood (ACCU-CHEK GUIDE) test strip [id number]  USE TO TEST TWICE DAILY. Brittany Grana, FNP  Active   methimazole (TAPAZOLE) 5 MG tablet [id number] No Take 0.5 tablets by mouth daily.  Patient not taking: Reported on 03/06/2023   [provider] Not Taking Active   omeprazole (PRILOSEC) 40 MG capsule [id number] Yes TAKE 1 CAPSULE  BY MOUTH EVERY DAY AS NEEDED Brittany Grana, FNP Taking Active   pioglitazone (ACTOS) 15 MG tablet [id number] Yes Take 1 tablet (15 mg total) by mouth daily. Brittany Grana, FNP Taking Active   pregabalin (LYRICA) 50 MG capsule [id number] Yes Take 50 mg by mouth 3 (three) times daily. [provider] Taking Active            Med Note Clearance Brittany [name] Mar 06, 2023 11:59 AM) Taking twice daily  Prenatal Vit-Fe Fumarate-FA (PRENATAL VITAMINS PO) [id number]  Take by mouth. [provider]  Active   Semaglutide, 1 MG/DOSE, 4 MG/3ML SOPN [id number] Yes Inject 1 mg as directed once a week. Brittany Grana, FNP Taking Active   Med List Note Brittany [name], [place] 01/10/17 1348): **[employer] EMPLOYEE**              Assessment/Plan:   Diabetes: - Currently uncontrolled - Reports she needs a refill on Ozempic 1 mg. Will collaborate with PCP to place refill request to [employer].  - Strongly encouraged to check glucose at home, fasting and post prandial, prior to next visit with PCP.  Hyperlipidemia/ASCVD Risk Reduction: - Currently controlled.  - Recommend to continue current regimen at this time   Follow Up Plan: follow up with PCP as scheduled  Brittany [name], PharmD, BCACP, CPP [facility] [phone]

## 2023-03-08 NOTE — Telephone Encounter (Signed)
Signed and faxed back on 03/07/23

## 2023-04-30 ENCOUNTER — Encounter: Payer: Self-pay | Admitting: Pharmacist

## 2023-04-30 NOTE — Progress Notes (Unsigned)
Assessment & Plan:  There are no diagnoses linked to this encounter.   Return precautions given.   Risks, benefits, and alternatives of the medications and treatment plan prescribed today were discussed, and patient expressed understanding.   Education regarding symptom management and diagnosis given to patient on AVS either electronically or printed.  No follow-ups on file.  [name], FNP  Subjective:    Patient ID: Brittany [name], female    DOB: 1956/10/16, 67 y.o.   MRN: [id number]  CC: Brittany [name] is a 67 y.o. female who presents today for follow up.   HPI:  [name] has an office visit with you 05/02/2023. Patient carries a diagnosis of diabetes and has documented statin intolerance. She was last coded for G72.0 (statin myopathy) in 2023 and will require exclusion coding again in 2024 for removal from SUPD measure. Please consider assessing prior statin intolerance and exclusion coding.       Allergies: Jardiance [empagliflozin], Lisinopril, Atorvastatin, and Victoza [liraglutide] Current Outpatient Medications on File Prior to Visit  Medication Sig Dispense Refill   Accu-Chek FastClix Lancets MISC Apply topically.     alendronate (FOSAMAX) 70 MG tablet Take 1 tablet (70 mg total) by mouth every 7 (seven) days. Take with a full glass of water on an empty stomach. 4 tablet 11   blood glucose meter kit and supplies Dispense based on patient and insurance preference. Use up to four times daily as directed. (FOR ICD-10 E10.9, E11.9). 1 each 0   Blood Glucose Monitoring Suppl (ACCU-CHEK GUIDE) w/Device KIT 1 Device by Does not apply route daily as needed. 1 kit 0   cholecalciferol (VITAMIN D3) 25 MCG (1000 UNIT) tablet Take 1,000 Units by mouth daily.     dapagliflozin propanediol (FARXIGA) 10 MG TABS tablet TAKE 1 TABLET BY MOUTH EVERY DAY IN THE MORNING BEFORE BREAKFAST 90 tablet 3   DULoxetine (CYMBALTA) 60 MG capsule Take 1 capsule (60 mg total) by mouth daily. 90  capsule 3   Evolocumab (REPATHA SURECLICK) 140 MG/ML SOAJ Inject 140 mg into the skin every 14 (fourteen) days. 6 mL 3   ezetimibe (ZETIA) 10 MG tablet Take 1 tablet (10 mg total) by mouth daily. 90 tablet 3   fexofenadine (ALLEGRA) 180 MG tablet Take 180 mg by mouth daily.     glimepiride (AMARYL) 4 MG tablet Take 1 tablet (4 mg total) by mouth daily before breakfast. 90 tablet 3   glucose blood (ACCU-CHEK GUIDE) test strip USE TO TEST TWICE DAILY. 50 strip 3   HYDROcodone-acetaminophen (NORCO/VICODIN) 5-325 MG tablet Take 1 tablet by mouth every 4 (four) hours as needed.     ibuprofen (ADVIL) 800 MG tablet Take 800 mg by mouth in the morning, at noon, and at bedtime.     methimazole (TAPAZOLE) 5 MG tablet Take 0.5 tablets by mouth daily. (Patient not taking: Reported on 03/06/2023)     omeprazole (PRILOSEC) 40 MG capsule TAKE 1 CAPSULE BY MOUTH EVERY DAY AS NEEDED 90 capsule 1   pioglitazone (ACTOS) 15 MG tablet Take 1 tablet (15 mg total) by mouth daily. 90 tablet 3   pregabalin (LYRICA) 50 MG capsule Take 50 mg by mouth 3 (three) times daily.     Prenatal Vit-Fe Fumarate-FA (PRENATAL VITAMINS PO) Take by mouth.     Semaglutide, 1 MG/DOSE, 4 MG/3ML SOPN Inject 1 mg as directed once a week. 3 mL 3   No current facility-administered medications on file prior to visit.  Review of Systems    Objective:    There were no vitals taken for this visit. BP Readings from Last 3 Encounters:  01/24/23 126/84  09/22/22 128/72  08/29/22 120/86   Wt Readings from Last 3 Encounters:  01/24/23 153 lb 9.6 oz (69.7 kg)  01/05/23 157 lb (71.2 kg)  09/22/22 157 lb 6.4 oz (71.4 kg)    Physical Exam

## 2023-04-30 NOTE — Progress Notes (Signed)
[facility]) [facility] Quality Pharmacy Team Statin Quality Measure Assessment  04/30/2023  Brittany [name] August 18, 1956 [id number]  Per review of chart and payor information, patient has a diagnosis of diabetes but is not currently filling a statin prescription.  This places patient into the Statin Use In Patients with Diabetes (SUPD) measure for CMS.    Patient has documented allergy to statin but no corresponding CPT codes that would exclude patient from SUPD measure.  The ASCVD Risk score (Arnett DK, et al., 2019) failed to calculate for the following reasons:   The valid total cholesterol range is 130 to 320 mg/dL 03/31/6294     Component Value Date/Time   CHOL 118 01/24/2023 1538   CHOL 199 03/07/2021 0919   TRIG 107.0 01/24/2023 1538   HDL 61.80 01/24/2023 1538   HDL 54 03/07/2021 0919   CHOLHDL 2 01/24/2023 1538   VLDL 21.4 01/24/2023 1538   LDLCALC 34 01/24/2023 1538   LDLCALC 72 12/02/2021 1504    Please consider ONE of the following recommendations:  Initiate high intensity statin Atorvastatin 40 mg once daily, #90, 3 refills   Rosuvastatin 20 mg once daily, #90, 3 refills    Initiate moderate intensity          statin with reduced frequency if prior          statin intolerance 1x weekly, #13, 3 refills   2x weekly, #26, 3 refills   3x weekly, #39, 3 refills    Code for past statin intolerance or  other exclusions (required annually)  Provider Requirements: Associate code during an office visit or telehealth encounter  Drug Induced Myopathy G72.0   Myopathy, unspecified G72.9   Myositis, unspecified M60.9   Rhabdomyolysis M62.82   Cirrhosis of liver K74.69   Prediabetes R73.03   PCOS E28.2   Thank you for allowing [facility] pharmacy to be a part of this patient's care.   [name], PharmD Clinical Pharmacist [place] Direct Dial: [phone]

## 2023-05-02 ENCOUNTER — Encounter: Payer: Self-pay | Admitting: Family

## 2023-05-02 ENCOUNTER — Ambulatory Visit: Payer: Medicare HMO | Admitting: Family

## 2023-05-02 VITALS — BP 124/74 | HR 85 | Temp 98.3°F | Ht 62.0 in | Wt 149.2 lb

## 2023-05-02 DIAGNOSIS — G720 Drug-induced myopathy: Secondary | ICD-10-CM

## 2023-05-02 DIAGNOSIS — E785 Hyperlipidemia, unspecified: Secondary | ICD-10-CM

## 2023-05-02 DIAGNOSIS — M62838 Other muscle spasm: Secondary | ICD-10-CM | POA: Insufficient documentation

## 2023-05-02 DIAGNOSIS — R7309 Other abnormal glucose: Secondary | ICD-10-CM | POA: Diagnosis not present

## 2023-05-02 DIAGNOSIS — T466X5A Adverse effect of antihyperlipidemic and antiarteriosclerotic drugs, initial encounter: Secondary | ICD-10-CM

## 2023-05-02 DIAGNOSIS — E118 Type 2 diabetes mellitus with unspecified complications: Secondary | ICD-10-CM

## 2023-05-02 LAB — POCT GLYCOSYLATED HEMOGLOBIN (HGB A1C): Hemoglobin A1C: 7.8 % — AB (ref 4.0–5.6)

## 2023-05-02 MED ORDER — ACCU-CHEK GUIDE VI STRP
ORAL_STRIP | 3 refills | Status: DC
Start: 2023-05-02 — End: 2023-11-21

## 2023-05-02 MED ORDER — SEMAGLUTIDE (2 MG/DOSE) 8 MG/3ML ~~LOC~~ SOPN
2.0000 mg | PEN_INJECTOR | SUBCUTANEOUS | 3 refills | Status: AC
Start: 2023-05-02 — End: ?

## 2023-05-02 MED ORDER — ACCU-CHEK FASTCLIX LANCETS MISC
1 refills | Status: AC
Start: 2023-05-02 — End: ?

## 2023-05-02 MED ORDER — MELOXICAM 7.5 MG PO TABS
7.5000 mg | ORAL_TABLET | Freq: Every day | ORAL | 1 refills | Status: DC | PRN
Start: 2023-05-02 — End: 2023-06-28

## 2023-05-02 MED ORDER — CYCLOBENZAPRINE HCL 5 MG PO TABS
5.0000 mg | ORAL_TABLET | Freq: Three times a day (TID) | ORAL | 1 refills | Status: DC | PRN
Start: 2023-05-02 — End: 2023-11-21

## 2023-05-02 NOTE — Assessment & Plan Note (Signed)
Cholesterol at goal.  LDL 34 . history of statin neuropathy.  Patient is compliant with Zetia 10 mg daily, continue.

## 2023-05-02 NOTE — Assessment & Plan Note (Signed)
Consistent with muscular spasm.  Suspect underlying degenerative disc disease.  Patient politely declines x-ray at this time which I think is reasonable.  Will consider in the future.  Provided meloxicam and advised to use for short period of time and to take with food.  Provided her with Flexeril and exercises she can do at home.  she will let me know how she is doing

## 2023-05-02 NOTE — Patient Instructions (Addendum)
Trial of muscle relaxant ( flexeril) and anti inflammatory ( mobic) .  Do not drive or operate heavy machinery while on muscle relaxant. Please do not drink alcohol. Only take this medication as needed for acute muscle spasm at bedtime. This medication make you feel drowsy so be very careful.  Stop taking if become too drowsy or somnolent as this puts you at risk for falls. Please contact our office with any questions.  A couple of points in regards to meloxicam ( Mobic) -  This medication is not intended for daily , long term use. It is a potent anti inflammatory ( NSAID), and my intention is for you take as needed for moderate to severe pain. If you find yourself using daily, please let me know.   Please takes Mobic ( meloxicam) with FOOD since it is an anti-inflammatory as it can cause a GI bleed or ulcer. If you have a history of GI bleed or ulcer, please do NOT take.  Do no take over the counter aleve, motrin, advil, goody's powder for pain as they are also NSAIDs, and they are  in the same class as Mobic  Lastly, we will need to monitor kidney function while on Mobic, and if we were to see any decline in kidney function in the future, we would have to discontinue this medication.     Please call  and schedule your 3D mammogram and /or bone density scan as we discussed.   [facility]  ( new location in 2023)  [address], [place], [place] [address]  [place], [place]  [phone]   This is  Dr. Melina Schools  example of a  "Low GI"  Diet:  It will allow you to lose 4 to 8  lbs  per month if you follow it carefully.  Your goal with exercise is a minimum of 30 minutes of aerobic exercise 5 days per week (Walking does not count once it becomes easy!)    All of the foods can be found at grocery stores and in bulk at [employer].  The Atkins protein bars and shakes are available in more varieties at Target, WalMart and Lowe's Foods.     7 AM Breakfast:  Choose from the  following:  Low carbohydrate Protein  Shakes (I recommend the  Premier Protein chocolate shakes,  EAS AdvantEdge "Carb Control" shakes  Or the Atkins shakes all are under 3 net carbs)     a scrambled egg/bacon/cheese burrito made with Mission's "carb balance" whole wheat tortilla  (about 10 net carbs )  Medical laboratory scientific officer (basically a quiche without the pastry crust) that is eaten cold and very convenient way to get your eggs.  8 carbs)  If you make your own protein shakes, avoid bananas and pineapple,  And use low carb greek yogurt or original /unsweetened almond or soy milk    Avoid cereal and bananas, oatmeal and cream of wheat and grits. They are loaded with carbohydrates!   10 AM: high protein snack:  Protein bar by Atkins (the snack size, under 200 cal, usually < 6 net carbs).    A stick of cheese:  Around 1 carb,  100 cal     Dannon Light n Fit [place] Yogurt  (80 cal, 8 carbs)  Other so called "protein bars" and Greek yogurts tend to be loaded with carbohydrates.  Remember, in food advertising, the word "energy" is synonymous for " carbohydrate."  Lunch:   A  Sandwich using the bread choices listed, Can use any  Eggs,  lunchmeat, grilled meat or canned tuna), avocado, regular mayo/mustard  and cheese.  A Salad using blue cheese, ranch,  Goddess or vinagrette,  Avoid taco shells, croutons or "confetti" and no "candied nuts" but regular nuts OK.   No pretzels, nabs  or chips.  Pickles and miniature sweet peppers are a good low carb alternative that provide a "crunch"  The bread is the only source of carbohydrate in a sandwich and  can be decreased by trying some of the attached alternatives to traditional loaf bread   Avoid "Low fat dressings, as well as Reyne Dumas and [employer] Foods dressings They are loaded with sugar!   3 PM/ Mid day  Snack:  Consider  1 ounce of  almonds, walnuts, pistachios, pecans, peanuts,  Macadamia nuts or a nut medley.  Avoid "granola and  granola bars "  Mixed nuts are ok in moderation as long as there are no raisins,  cranberries or dried fruit.   KIND bars are OK if you get the low glycemic index variety   Try the prosciutto/mozzarella cheese sticks by Fiorruci  In deli /backery section   High protein      6 PM  Dinner:     Meat/fowl/fish with a green salad, and either broccoli, cauliflower, green beans, spinach, brussel sprouts or  Lima beans. DO NOT BREAD THE PROTEIN!!      There is a low carb pasta by Dreamfield's that is acceptable and tastes great: only 5 digestible carbs/serving.( All grocery stores but BJs carry it ) Several ready made meals are available low carb:   Try Michel Angelo's chicken piccata or chicken or eggplant parm over low carb pasta.(Lowes and BJs)   Clifton Custard Sanchez's "Carnitas" (pulled pork, no sauce,  0 carbs) or his beef pot roast to make a dinner burrito (at BJ's)  Pesto over low carb pasta (bj's sells a good quality pesto in the center refrigerated section of the deli   Try satueeing  Roosvelt Harps with mushroooms as a good side   Green Giant makes a mashed cauliflower that tastes like mashed potatoes  Whole wheat pasta is still full of digestible carbs and  Not as low in glycemic index as Dreamfield's.   Brown rice is still rice,  So skip the rice and noodles if you eat [place] or [place] (or at least limit to 1/2 cup)  9 PM snack :   Breyer's "low carb" fudgsicle or  ice cream bar (Carb Smart line), or  Weight Watcher's ice cream bar , or another "no sugar added" ice cream;  a serving of fresh berries/cherries with whipped cream   Cheese or DANNON'S LlGHT N FIT GREEK YOGURT  8 ounces of [place] Diamond unsweetened almond/cococunut milk    Treat yourself to a parfait made with whipped cream blueberiies, walnuts and vanilla greek yogurt  Avoid bananas, pineapple, grapes  and watermelon on a regular basis because they are high in sugar.  THINK OF THEM AS DESSERT  Remember that snack Substitutions should be  less than 10 NET carbs per serving and meals < 20 carbs. Remember to subtract fiber grams to get the "net carbs."  Cervical Strain and Sprain Rehab Ask your health care provider which exercises are safe for you. Do exercises exactly as told by your health care provider and adjust them as directed. It is normal to feel mild stretching, pulling, tightness, or discomfort as you do these exercises. Stop  right away if you feel sudden pain or your pain gets worse. Do not begin these exercises until told by your health care provider. Stretching and range-of-motion exercises Cervical side bending  Using good posture, sit on a stable chair or stand up. Without moving your shoulders, slowly tilt your left / right ear to your shoulder until you feel a stretch in the neck muscles on the opposite side. You should be looking straight ahead. Hold for __________ seconds. Repeat with the other side of your neck. Repeat __________ times. Complete this exercise __________ times a day. Cervical rotation  Using good posture, sit on a stable chair or stand up. Slowly turn your head to the side as if you are looking over your left / right shoulder. Keep your eyes level with the ground. Stop when you feel a stretch along the side and the back of your neck. Hold for __________ seconds. Repeat this by turning to your other side. Repeat __________ times. Complete this exercise __________ times a day. Thoracic extension and pectoral stretch  Roll a towel or a small blanket so it is about 4 inches (10 cm) in diameter. Lie down on your back on a firm surface. Put the towel in the middle of your back across your spine. It should not be under your shoulder blades. Put your hands behind your head and let your elbows fall out to your sides. Hold for __________ seconds. Repeat __________ times. Complete this exercise __________ times a day. Strengthening exercises Upper cervical flexion  Lie on your back with a thin  pillow behind your head or a small, rolled-up towel under your neck. Gently tuck your chin toward your chest and nod your head down to look toward your feet. Do not lift your head off the pillow. Hold for __________ seconds. Release the tension slowly. Relax your neck muscles completely before you repeat this exercise. Repeat __________ times. Complete this exercise __________ times a day. Cervical extension  Stand about 6 inches (15 cm) away from a wall, with your back facing the wall. Place a soft object, about 6-8 inches (15-20 cm) in diameter, between the back of your head and the wall. A soft object could be a small pillow, a ball, or a folded towel. Gently tilt your head back and press into the soft object. Keep your jaw and forehead relaxed. Hold for __________ seconds. Release the tension slowly. Relax your neck muscles completely before you repeat this exercise. Repeat __________ times. Complete this exercise __________ times a day. Posture and body mechanics Body mechanics refer to the movements and positions of your body while you do your daily activities. Posture is part of body mechanics. Good posture and healthy body mechanics can help to relieve stress in your body's tissues and joints. Good posture means that your spine is in its natural S-curve position (your spine is neutral), your shoulders are pulled back slightly, and your head is not tipped forward. The following are general guidelines for using improved posture and body mechanics in your everyday activities. Sitting  When sitting, keep your spine neutral and keep your feet flat on the floor. Use a footrest, if needed, and keep your thighs parallel to the floor. Avoid rounding your shoulders. Avoid tilting your head forward. When working at a desk or a computer, keep your desk at a height where your hands are slightly lower than your elbows. Slide your chair under your desk so you are close enough to maintain good  posture. When working  at a computer, place your monitor at a height where you are looking straight ahead and you do not have to tilt your head forward or downward to look at the screen. Standing  When standing, keep your spine neutral and keep your feet about hip-width apart. Keep a slight bend in your knees. Your ears, shoulders, and hips should line up. When you do a task in which you stand in one place for a long time, place one foot up on a stable object that is 2-4 inches (5-10 cm) high, such as a footstool. This helps keep your spine neutral. Resting When lying down and resting, avoid positions that are most painful for you. Try to support your neck in a neutral position. You can use a contour pillow or a small rolled-up towel. Your pillow should support your neck but not push on it. This information is not intended to replace advice given to you by your health care provider. Make sure you discuss any questions you have with your health care provider. Document Revised: 05/01/2022 Document Reviewed: 05/01/2022 Elsevier Patient Education  2024 [employer].

## 2023-05-02 NOTE — Assessment & Plan Note (Addendum)
Lab Results  Component Value Date   HGBA1C 7.8 (A) 05/02/2023   Uncontrolled.  Increase Ozempic to 2 mg . Continue glimepiride 4 mg every morning, Actos 10 mg daily, Farxiga 10 mg daily.

## 2023-05-10 ENCOUNTER — Telehealth: Payer: Self-pay

## 2023-05-10 ENCOUNTER — Other Ambulatory Visit: Payer: Self-pay | Admitting: Family

## 2023-05-10 DIAGNOSIS — E785 Hyperlipidemia, unspecified: Secondary | ICD-10-CM

## 2023-05-10 NOTE — Telephone Encounter (Signed)
Spoke to pt to inform her that her medication Ozempic (4 BOXES) came in from pt assistance and she can pick up anytime between 9 am an 5 pm M-F

## 2023-05-15 NOTE — Telephone Encounter (Signed)
Pt picked up medication.

## 2023-06-28 ENCOUNTER — Other Ambulatory Visit: Payer: Self-pay | Admitting: Family

## 2023-06-28 DIAGNOSIS — M62838 Other muscle spasm: Secondary | ICD-10-CM

## 2023-07-04 ENCOUNTER — Telehealth: Payer: Self-pay

## 2023-07-04 NOTE — Patient Outreach (Signed)
  Care Coordination   Initial Visit Note   07/04/2023 Name: Brittany [name] MRN: [id number] DOB: 09-18-1956  Brittany [name] is a 67 y.o. year old female who sees Arnett, Lyn Records, FNP for primary care. I spoke with  Chancy Milroy by phone today.  What matters to the patients health and wellness today?  CM educated patient on [facility] services.  Patient declines services and feels that she is able to manage her medical needs.  Patient agreed to contact her provider in the future if she needs [facility] services.    Goals Addressed             This Visit's Progress    Care Coordination       Interventions Today    Flowsheet Row Most Recent Value  General Interventions   General Interventions Discussed/Reviewed General Interventions Discussed, General Interventions Reviewed  [Pt states she is not struggling with food, housing, transportation.  Pt receives assistance with medication.]  Education Interventions   Education Provided Provided Education  [Pt is educated on Care Coordination services]              SDOH assessments and interventions completed:  No     Care Coordination Interventions:  Yes, provided   Follow up plan: No further intervention required.   Encounter Outcome:  Patient Refused

## 2023-07-26 ENCOUNTER — Other Ambulatory Visit: Payer: Self-pay | Admitting: Family

## 2023-07-26 DIAGNOSIS — E11319 Type 2 diabetes mellitus with unspecified diabetic retinopathy without macular edema: Secondary | ICD-10-CM

## 2023-08-03 ENCOUNTER — Other Ambulatory Visit: Payer: Self-pay | Admitting: Family

## 2023-08-03 ENCOUNTER — Encounter: Payer: Self-pay | Admitting: Family

## 2023-08-03 ENCOUNTER — Ambulatory Visit: Payer: Medicare HMO | Admitting: Family

## 2023-08-03 VITALS — BP 136/82 | HR 85 | Temp 98.1°F | Ht 62.0 in | Wt 146.0 lb

## 2023-08-03 DIAGNOSIS — E041 Nontoxic single thyroid nodule: Secondary | ICD-10-CM

## 2023-08-03 DIAGNOSIS — E785 Hyperlipidemia, unspecified: Secondary | ICD-10-CM | POA: Diagnosis not present

## 2023-08-03 DIAGNOSIS — F339 Major depressive disorder, recurrent, unspecified: Secondary | ICD-10-CM

## 2023-08-03 DIAGNOSIS — E1140 Type 2 diabetes mellitus with diabetic neuropathy, unspecified: Secondary | ICD-10-CM | POA: Diagnosis not present

## 2023-08-03 DIAGNOSIS — E118 Type 2 diabetes mellitus with unspecified complications: Secondary | ICD-10-CM

## 2023-08-03 DIAGNOSIS — Z7984 Long term (current) use of oral hypoglycemic drugs: Secondary | ICD-10-CM | POA: Diagnosis not present

## 2023-08-03 DIAGNOSIS — M810 Age-related osteoporosis without current pathological fracture: Secondary | ICD-10-CM

## 2023-08-03 DIAGNOSIS — Z23 Encounter for immunization: Secondary | ICD-10-CM

## 2023-08-03 LAB — POCT GLYCOSYLATED HEMOGLOBIN (HGB A1C): Hemoglobin A1C: 7.4 % — AB (ref 4.0–5.6)

## 2023-08-03 MED ORDER — EZETIMIBE 10 MG PO TABS: 10.0000 mg | ORAL_TABLET | Freq: Every day | ORAL | 3 refills | Status: DC

## 2023-08-03 MED ORDER — DAPAGLIFLOZIN PROPANEDIOL 10 MG PO TABS
ORAL_TABLET | ORAL | 3 refills | Status: DC
Start: 2023-08-03 — End: 2023-08-19

## 2023-08-03 MED ORDER — DULOXETINE HCL 60 MG PO CPEP: 60.0000 mg | ORAL_CAPSULE | Freq: Every day | ORAL | 3 refills | Status: DC

## 2023-08-03 NOTE — Assessment & Plan Note (Signed)
No longer following with endocrine.  Ordered thyroid ultrasound for baseline surveillance.

## 2023-08-03 NOTE — Progress Notes (Signed)
Assessment & Plan:  DM (diabetes mellitus) with complications [facility]) Assessment & Plan: Lab Results  Component Value Date   HGBA1C 7.4 (A) 08/03/2023   Improved.  She has steadily lost weight . discussed dietary indiscretion, exercise.  Continue Ozempic 2 mg, glimepiride 4 mg every morning, Actos 10 mg daily, Farxiga 10 mg daily.  We may opt to increase glimepiride at follow-up if A1c is not < 7  Orders: -     POCT glycosylated hemoglobin (Hb A1C) -     Dapagliflozin Propanediol; TAKE 1 TABLET BY MOUTH EVERY DAY IN THE MORNING BEFORE BREAKFAST  Dispense: 90 tablet; Refill: 3  Hyperlipidemia, unspecified hyperlipidemia type Assessment & Plan: Lab Results  Component Value Date   LDLCALC 34 01/24/2023   Excellent control.  Continue Zetia 10 mg every day, repatha 140mg  q14 days.  Orders: -     Ezetimibe; Take 1 tablet (10 mg total) by mouth daily.  Dispense: 90 tablet; Refill: 3  Depression, recurrent (HCC) -     DULoxetine HCl; Take 1 capsule (60 mg total) by mouth daily.  Dispense: 90 capsule; Refill: 3  Type 2 diabetes mellitus with diabetic neuropathy, unspecified (HCC) -     Dapagliflozin Propanediol; TAKE 1 TABLET BY MOUTH EVERY DAY IN THE MORNING BEFORE BREAKFAST  Dispense: 90 tablet; Refill: 3  Thyroid nodule Assessment & Plan: No longer following with endocrine.  Ordered thyroid ultrasound for baseline surveillance.  Orders: -     US THYROID; Future  Encounter for immunization -     Flu Vaccine Trivalent High Dose (Fluad)     Return precautions given.   Risks, benefits, and alternatives of the medications and treatment plan prescribed today were discussed, and patient expressed understanding.   Education regarding symptom management and diagnosis given to patient on AVS either electronically or printed.  Return in about 3 months (around 11/03/2023).  [name], FNP  Subjective:    Patient ID: Brittany [name], female    DOB: May 06, 1956, 67 y.o.   MRN:  [id number]  CC: Brittany [name] is a 67 y.o. female who presents today for follow up.   HPI: Feels well today No new complaints.   Endorses snacking.  She is drinking sugar free 2 soda's per day.     Cpmpliant Ozempic to 2 mg,glimepiride 4 mg every morning, Actos 10 mg daily, Farxiga 10 mg daily   Occassional leg cramps. No leg swelling. No pain in calves with walking.   History of right-sided thyroid nodule, status postbiopsy.  She is no longer following with Dr. Gershon Crane  Allergies: Jardiance [empagliflozin], Lisinopril, Atorvastatin, and Victoza [liraglutide] Current Outpatient Medications on File Prior to Visit  Medication Sig Dispense Refill   Accu-Chek FastClix Lancets MISC USE AS DIRECTED 200 each 1   blood glucose meter kit and supplies Dispense based on patient and insurance preference. Use up to four times daily as directed. (FOR ICD-10 E10.9, E11.9). 1 each 0   Blood Glucose Monitoring Suppl (ACCU-CHEK GUIDE) w/Device KIT 1 Device by Does not apply route daily as needed. 1 kit 0   cholecalciferol (VITAMIN D3) 25 MCG (1000 UNIT) tablet Take 1,000 Units by mouth daily.     Evolocumab (REPATHA SURECLICK) 140 MG/ML SOAJ INJECT 140 MG INTO THE SKIN EVERY 14 (FOURTEEN) DAYS. 6 mL 1   fexofenadine (ALLEGRA) 180 MG tablet Take 180 mg by mouth daily.     glimepiride (AMARYL) 4 MG tablet TAKE 1 TABLET BY MOUTH DAILY BEFORE BREAKFAST. 90 tablet  3   glucose blood (ACCU-CHEK GUIDE) test strip USE TO TEST TWICE DAILY. 50 strip 3   ibuprofen (ADVIL) 800 MG tablet Take 800 mg by mouth in the morning, at noon, and at bedtime.     methimazole (TAPAZOLE) 5 MG tablet Take 0.5 tablets by mouth daily.     omeprazole (PRILOSEC) 40 MG capsule TAKE 1 CAPSULE BY MOUTH EVERY DAY AS NEEDED 90 capsule 1   pioglitazone (ACTOS) 15 MG tablet Take 1 tablet (15 mg total) by mouth daily. 90 tablet 3   pregabalin (LYRICA) 50 MG capsule Take 50 mg by mouth 3 (three) times daily.     Prenatal Vit-Fe  Fumarate-FA (PRENATAL VITAMINS PO) Take by mouth.     Semaglutide, 2 MG/DOSE, 8 MG/3ML SOPN Inject 2 mg as directed once a week. 3 mL 3   cyclobenzaprine (FLEXERIL) 5 MG tablet Take 1 tablet (5 mg total) by mouth 3 (three) times daily as needed for muscle spasms. (Patient not taking: Reported on 08/03/2023) 30 tablet 1   meloxicam (MOBIC) 7.5 MG tablet TAKE 1 TABLET BY MOUTH DAILY AS NEEDED FOR PAIN (Patient not taking: Reported on 08/03/2023) 30 tablet 1   No current facility-administered medications on file prior to visit.    Review of Systems  Constitutional:  Negative for chills and fever.  HENT:  Negative for trouble swallowing.   Respiratory:  Negative for cough.   Cardiovascular:  Negative for chest pain and palpitations.  Gastrointestinal:  Negative for nausea and vomiting.      Objective:    BP 136/82   Pulse 85   Temp 98.1 F (36.7 C) (Temporal)   Ht 5\' 2"  (1.575 m)   Wt 146 lb (66.2 kg)   SpO2 99%   BMI 26.70 kg/m  BP Readings from Last 3 Encounters:  08/03/23 136/82  05/02/23 124/74  01/24/23 126/84   Wt Readings from Last 3 Encounters:  08/03/23 146 lb (66.2 kg)  05/02/23 149 lb 3.2 oz (67.7 kg)  01/24/23 153 lb 9.6 oz (69.7 kg)    Physical Exam Vitals reviewed.  Constitutional:      Appearance: She is well-developed.  Eyes:     Conjunctiva/sclera: Conjunctivae normal.  Neck:     Thyroid: Thyromegaly (right) present. No thyroid tenderness.  Cardiovascular:     Rate and Rhythm: Normal rate and regular rhythm.     Pulses: Normal pulses.     Heart sounds: Normal heart sounds.  Pulmonary:     Effort: Pulmonary effort is normal.     Breath sounds: Normal breath sounds. No wheezing, rhonchi or rales.  Skin:    General: Skin is warm and dry.  Neurological:     Mental Status: She is alert.  Psychiatric:        Speech: Speech normal.        Behavior: Behavior normal.        Thought Content: Thought content normal.

## 2023-08-03 NOTE — Assessment & Plan Note (Addendum)
Lab Results  Component Value Date   LDLCALC 34 01/24/2023   Excellent control.  Continue Zetia 10 mg every day, repatha 140mg  q14 days.

## 2023-08-03 NOTE — Assessment & Plan Note (Addendum)
Lab Results  Component Value Date   HGBA1C 7.4 (A) 08/03/2023   Improved.  She has steadily lost weight . discussed dietary indiscretion, exercise.  Continue Ozempic 2 mg, glimepiride 4 mg every morning, Actos 10 mg daily, Farxiga 10 mg daily.  We may opt to increase glimepiride at follow-up if A1c is not < 7

## 2023-08-03 NOTE — Patient Instructions (Addendum)
Lets focus dietary changes with less carbohydrates and sugar.  I recommend walking 20 minutes most days of the week.  For leg cramps, I recommend over the counter B complex vitamin  I have ordered thyroid ultrasound.  Let us know if you dont hear back within a week in regards to an appointment being scheduled.   So that you are aware, if you are [facility] MyChart user , please pay attention to your MyChart messages as you may receive a MyChart message with a phone number to call and schedule this test/appointment own your own from our referral coordinator. This is a new process so I do not want you to miss this message.  If you are not a MyChart user, you will receive a phone call.

## 2023-08-06 ENCOUNTER — Ambulatory Visit
Admission: RE | Admit: 2023-08-06 | Discharge: 2023-08-06 | Disposition: A | Discharge status: Home / Self Care | Payer: Medicare HMO | Source: Ambulatory Visit | Attending: Family | Admitting: Family

## 2023-08-06 DIAGNOSIS — Z1231 Encounter for screening mammogram for malignant neoplasm of breast: Secondary | ICD-10-CM | POA: Diagnosis not present

## 2023-08-07 DIAGNOSIS — E113293 Type 2 diabetes mellitus with mild nonproliferative diabetic retinopathy without macular edema, bilateral: Secondary | ICD-10-CM | POA: Diagnosis not present

## 2023-08-15 NOTE — Telephone Encounter (Signed)
Please be advise AZ&ME ARE REQUESTING A PRESCRIPTION REFILL REQUEST A COPY OF REQUEST HAS BE SENT TO PT CHART IN MEDIA.   PLEASE HAVE PROVIDER TO SEND THROUGH [facility] I HAVE UP DATE PTS PREFERRED PHARMACY LIST   Melanee Spry CPhT Rx Patient Advocate [phone] (F) [phone]

## 2023-08-16 ENCOUNTER — Ambulatory Visit
Admission: RE | Admit: 2023-08-16 | Discharge: 2023-08-16 | Disposition: A | Discharge status: Home / Self Care | Payer: Medicare HMO | Source: Ambulatory Visit | Attending: Family | Admitting: Family

## 2023-08-16 DIAGNOSIS — E041 Nontoxic single thyroid nodule: Secondary | ICD-10-CM | POA: Diagnosis not present

## 2023-08-19 ENCOUNTER — Telehealth: Payer: Self-pay | Admitting: Pharmacist

## 2023-08-19 DIAGNOSIS — E1140 Type 2 diabetes mellitus with diabetic neuropathy, unspecified: Secondary | ICD-10-CM

## 2023-08-19 DIAGNOSIS — E118 Type 2 diabetes mellitus with unspecified complications: Secondary | ICD-10-CM

## 2023-08-19 NOTE — Telephone Encounter (Signed)
Received message from patient assistance technicians that new prescription for Brittany [name] is needed to [facility] (dispenses for [place] [place] patient assistance). Order pended for PCP to review and send.

## 2023-08-21 MED ORDER — DAPAGLIFLOZIN PROPANEDIOL 10 MG PO TABS: ORAL_TABLET | ORAL | 3 refills | Status: DC

## 2023-08-21 NOTE — Telephone Encounter (Signed)
Noted  Refilled farxiga

## 2023-08-22 ENCOUNTER — Telehealth: Payer: Self-pay

## 2023-08-22 NOTE — Telephone Encounter (Signed)
Spoke to pt and informed her that her Pt assistance medication Ozempic (3 boxes) is here in office to be picked up anytime between 9 am and 5 pm M-F

## 2023-08-25 ENCOUNTER — Other Ambulatory Visit: Payer: Self-pay | Admitting: Family

## 2023-08-25 DIAGNOSIS — E1140 Type 2 diabetes mellitus with diabetic neuropathy, unspecified: Secondary | ICD-10-CM

## 2023-08-27 ENCOUNTER — Telehealth: Payer: Self-pay

## 2023-08-27 NOTE — Telephone Encounter (Signed)
Pt came into office to pick up Pt assistance medication Ozempic (3 boxes) on 08/27/23 @ 10:30 am

## 2023-08-29 ENCOUNTER — Other Ambulatory Visit: Payer: Self-pay | Admitting: Family

## 2023-08-29 DIAGNOSIS — E041 Nontoxic single thyroid nodule: Secondary | ICD-10-CM

## 2023-09-05 DIAGNOSIS — H52223 Regular astigmatism, bilateral: Secondary | ICD-10-CM | POA: Diagnosis not present

## 2023-09-05 DIAGNOSIS — E113293 Type 2 diabetes mellitus with mild nonproliferative diabetic retinopathy without macular edema, bilateral: Secondary | ICD-10-CM | POA: Diagnosis not present

## 2023-09-05 DIAGNOSIS — H5203 Hypermetropia, bilateral: Secondary | ICD-10-CM | POA: Diagnosis not present

## 2023-09-05 DIAGNOSIS — H524 Presbyopia: Secondary | ICD-10-CM | POA: Diagnosis not present

## 2023-09-06 NOTE — Telephone Encounter (Signed)
Received notification from [employer] regarding approval for [facility]. Patient assistance approved from 10/24/23 to 10/22/24.  Medication will ship to PATIENT  Pt ID: [id number]  Company phone: [phone]

## 2023-09-17 ENCOUNTER — Telehealth: Payer: Self-pay

## 2023-09-17 NOTE — Telephone Encounter (Signed)
Pt has already been re enrolled in AZ&ME program, still needs to be re enrolled in [employer] for [employer].

## 2023-09-17 NOTE — Telephone Encounter (Signed)
Apply on line and fax to provider portion

## 2023-09-29 DIAGNOSIS — H524 Presbyopia: Secondary | ICD-10-CM | POA: Diagnosis not present

## 2023-10-08 ENCOUNTER — Encounter: Payer: Self-pay | Admitting: Pharmacist

## 2023-10-08 NOTE — Progress Notes (Addendum)
[employer] - Re-enrollment 2025   Medication(s): All cholesterol medications (Repatha)   Currently Enrolled: Through 10/02/23; Eligible for re-enrollment    Application Status:  Approved for re-enrollment    [employer]: ID [id number] Fund: Hypercholesterolemia - Medicare Access Assistance Type: Co-pay Start Date: 10/03/2023 End Date: 10/01/2024               Rx Card: Card No.  [id number] RX BIN:  [id number] PCN:  [id number] Group:  [id number]   12/17: Called [facility] and provided the new card information.    Loree Fee, PharmD Clinical Pharmacist [facility] [phone]

## 2023-10-25 NOTE — Telephone Encounter (Signed)
 Refaxed provider portion

## 2023-10-31 ENCOUNTER — Other Ambulatory Visit (HOSPITAL_COMMUNITY): Payer: Self-pay

## 2023-10-31 NOTE — Telephone Encounter (Signed)
 Refaxed pt portion to [employer], when called, automated system states pt is "missing info in app"

## 2023-11-04 ENCOUNTER — Other Ambulatory Visit: Payer: Self-pay | Admitting: Family

## 2023-11-07 ENCOUNTER — Other Ambulatory Visit: Payer: Self-pay | Admitting: Family

## 2023-11-07 ENCOUNTER — Ambulatory Visit: Payer: Medicare HMO | Admitting: Family

## 2023-11-07 ENCOUNTER — Telehealth: Payer: Self-pay | Admitting: Family

## 2023-11-07 DIAGNOSIS — E1140 Type 2 diabetes mellitus with diabetic neuropathy, unspecified: Secondary | ICD-10-CM

## 2023-11-07 DIAGNOSIS — E785 Hyperlipidemia, unspecified: Secondary | ICD-10-CM

## 2023-11-07 DIAGNOSIS — E118 Type 2 diabetes mellitus with unspecified complications: Secondary | ICD-10-CM

## 2023-11-07 MED ORDER — DAPAGLIFLOZIN PROPANEDIOL 10 MG PO TABS
ORAL_TABLET | ORAL | 3 refills | Status: DC
Start: 2023-11-07 — End: 2024-05-13

## 2023-11-07 MED ORDER — REPATHA SURECLICK 140 MG/ML ~~LOC~~ SOAJ: 140.0000 mg | SUBCUTANEOUS | 1 refills | Status: DC

## 2023-11-07 NOTE — Telephone Encounter (Signed)
 Call pt  Upon review of her chart patient has not refilled Lyrica  in some time.  I wanted to confirm if she is still taking Lyrica .  Please confirm exact dosing and let me know if she needs refill  Sch follow up appt for DM

## 2023-11-07 NOTE — Telephone Encounter (Signed)
 Spoke to pt and she does not take the Lyrica  anymore due to fact that she did not think it was helping. Scheduled f/up appt for 11/21/23

## 2023-11-12 ENCOUNTER — Encounter: Payer: Self-pay | Admitting: Pharmacist

## 2023-11-12 NOTE — Progress Notes (Signed)
Called pharmacy to ensure Repatha refill is processed through hew new [employer] card.   Pharmacist confirms $0 copay.

## 2023-11-21 ENCOUNTER — Encounter: Payer: Self-pay | Admitting: Family

## 2023-11-21 ENCOUNTER — Ambulatory Visit: Payer: Medicare HMO | Admitting: Family

## 2023-11-21 VITALS — BP 130/70 | HR 98 | Temp 98.3°F | Ht 62.0 in | Wt 141.0 lb

## 2023-11-21 DIAGNOSIS — R7309 Other abnormal glucose: Secondary | ICD-10-CM

## 2023-11-21 DIAGNOSIS — G6289 Other specified polyneuropathies: Secondary | ICD-10-CM

## 2023-11-21 DIAGNOSIS — E118 Type 2 diabetes mellitus with unspecified complications: Secondary | ICD-10-CM

## 2023-11-21 DIAGNOSIS — Z7984 Long term (current) use of oral hypoglycemic drugs: Secondary | ICD-10-CM

## 2023-11-21 DIAGNOSIS — E1140 Type 2 diabetes mellitus with diabetic neuropathy, unspecified: Secondary | ICD-10-CM | POA: Diagnosis not present

## 2023-11-21 DIAGNOSIS — E785 Hyperlipidemia, unspecified: Secondary | ICD-10-CM | POA: Diagnosis not present

## 2023-11-21 DIAGNOSIS — Z7985 Long-term (current) use of injectable non-insulin antidiabetic drugs: Secondary | ICD-10-CM | POA: Diagnosis not present

## 2023-11-21 LAB — POCT GLYCOSYLATED HEMOGLOBIN (HGB A1C): Hemoglobin A1C: 7 % — AB (ref 4.0–5.6)

## 2023-11-21 MED ORDER — PIOGLITAZONE HCL 15 MG PO TABS: 15.0000 mg | ORAL_TABLET | Freq: Every day | ORAL | 3 refills | Status: AC

## 2023-11-21 MED ORDER — REPATHA SURECLICK 140 MG/ML ~~LOC~~ SOAJ: 140.0000 mg | SUBCUTANEOUS | 1 refills | Status: DC

## 2023-11-21 MED ORDER — NORTRIPTYLINE HCL 10 MG PO CAPS: 10.0000 mg | ORAL_CAPSULE | Freq: Every day | ORAL | 2 refills | Status: DC

## 2023-11-21 NOTE — Assessment & Plan Note (Addendum)
Suboptimal control . previously Lyrica, gabapentin were ineffective.  Discussed nortriptyline 10mg  at bedtime.  Patient is also on Cymbalta 60 mg.  Discussed serotonin syndrome and collaborated with Pharm.D., Berenice Primas.  Will monitor for symptoms of excess serotonin with initiation of nortriptyline

## 2023-11-21 NOTE — Patient Instructions (Signed)
Please restart fosamax and repatha  Trial of nortriptyline for neuropathy.

## 2023-11-21 NOTE — Assessment & Plan Note (Signed)
Lab Results  Component Value Date   HGBA1C 7.0 (A) 11/21/2023   Improved.  She has continued to steadily lost weight . discussed dietary indiscretion, exercise.  Continue Ozempic 2 mg, glimepiride 4 mg every morning, Actos 10 mg daily, Farxiga 10 mg daily.

## 2023-11-21 NOTE — Progress Notes (Signed)
Assessment & Plan:  Elevated glucose -     POCT glycosylated hemoglobin (Hb A1C)  Hyperlipidemia, unspecified hyperlipidemia type -     Repatha SureClick; Inject 140 mg into the skin every 14 (fourteen) days.  Dispense: 6 mL; Refill: 1  Type 2 diabetes mellitus with diabetic neuropathy, unspecified (HCC) -     Pioglitazone HCl; Take 1 tablet (15 mg total) by mouth daily.  Dispense: 90 tablet; Refill: 3  Other polyneuropathy Assessment & Plan: Suboptimal control . previously Lyrica, gabapentin were ineffective.  Discussed nortriptyline 10mg  at bedtime.  Patient is also on Cymbalta 60 mg.  Discussed serotonin syndrome and collaborated with Pharm.D., Berenice Primas.  Will monitor for symptoms of excess serotonin with initiation of nortriptyline  Orders: -     Nortriptyline HCl; Take 1 capsule (10 mg total) by mouth at bedtime.  Dispense: 90 capsule; Refill: 2  DM (diabetes mellitus) with complications [facility]) Assessment & Plan: Lab Results  Component Value Date   HGBA1C 7.0 (A) 11/21/2023   Improved.  She has continued to steadily lost weight . discussed dietary indiscretion, exercise.  Continue Ozempic 2 mg, glimepiride 4 mg every morning, Actos 10 mg daily, Farxiga 10 mg daily.        Return precautions given.   Risks, benefits, and alternatives of the medications and treatment plan prescribed today were discussed, and patient expressed understanding.   Education regarding symptom management and diagnosis given to patient on AVS either electronically or printed.  Return in about 3 months (around 02/19/2024).  [name], FNP  Subjective:    Patient ID: Brittany [name], female    DOB: January 19, 1956, 68 y.o.   MRN: [id number]  CC: Brittany [name] is a 68 y.o. female who presents today for follow up.   HPI: Feels well today.  She is more physically active and attributes this to her weight loss.   she continues to bothered by bilateral burning in her feet and toes.  Denies low  back pain, saddle anesthesia, numbness in her legs.  Gabapentin, Lyrica were ineffective in the past.  She uses Biofreeze with some relief.    Compliant with Ozempic 2 mg, glimepiride 4 mg every morning, Actos 10 mg daily, Farxiga 10 mg daily.    Allergies: Jardiance [empagliflozin], Lisinopril, Atorvastatin, and Victoza [liraglutide] Current Outpatient Medications on File Prior to Visit  Medication Sig Dispense Refill   Accu-Chek FastClix Lancets MISC USE AS DIRECTED 200 each 1   cholecalciferol (VITAMIN D3) 25 MCG (1000 UNIT) tablet Take 1,000 Units by mouth daily.     dapagliflozin propanediol (FARXIGA) 10 MG TABS tablet TAKE 1 TABLET BY MOUTH EVERY DAY IN THE MORNING BEFORE BREAKFAST 90 tablet 3   DULoxetine (CYMBALTA) 60 MG capsule Take 1 capsule (60 mg total) by mouth daily. 90 capsule 3   ezetimibe (ZETIA) 10 MG tablet Take 1 tablet (10 mg total) by mouth daily. 90 tablet 3   glimepiride (AMARYL) 4 MG tablet TAKE 1 TABLET BY MOUTH DAILY BEFORE BREAKFAST. 90 tablet 3   methimazole (TAPAZOLE) 5 MG tablet Take 0.5 tablets by mouth daily.     omeprazole (PRILOSEC) 40 MG capsule TAKE 1 CAPSULE BY MOUTH EVERY DAY AS NEEDED 90 capsule 1   Semaglutide, 2 MG/DOSE, 8 MG/3ML SOPN Inject 2 mg as directed once a week. 3 mL 3   alendronate (FOSAMAX) 70 MG tablet TAKE 1 TABLET (70 MG TOTAL) BY MOUTH EVERY 7 DAYS WITH FULL GLASS WATER ON EMPTY STOMACH (Patient not  taking: Reported on 11/21/2023) 12 tablet 1   No current facility-administered medications on file prior to visit.    Review of Systems  Constitutional:  Negative for chills and fever.  Respiratory:  Negative for cough.   Cardiovascular:  Negative for chest pain and palpitations.  Gastrointestinal:  Negative for nausea and vomiting.  Neurological:  Positive for numbness (bilateral feet).      Objective:    BP 130/70   Pulse 98   Temp 98.3 F (36.8 C) (Oral)   Ht 5\' 2"  (1.575 m)   Wt 141 lb (64 kg)   SpO2 98%   BMI 25.79 kg/m   BP Readings from Last 3 Encounters:  11/21/23 130/70  08/03/23 136/82  05/02/23 124/74   Wt Readings from Last 3 Encounters:  11/21/23 141 lb (64 kg)  08/03/23 146 lb (66.2 kg)  05/02/23 149 lb 3.2 oz (67.7 kg)    Physical Exam Vitals reviewed.  Constitutional:      Appearance: She is well-developed.  Eyes:     Conjunctiva/sclera: Conjunctivae normal.  Cardiovascular:     Rate and Rhythm: Normal rate and regular rhythm.     Pulses: Normal pulses.     Heart sounds: Normal heart sounds.     Comments: No LE edema, palpable cords or masses. No erythema or increased warmth. No asymmetry in calf size when compared bilaterally LE hair growth symmetric and present. No discoloration or varicosities noted. LE warm and palpable pedal pulses. Sensation intact  Pulmonary:     Effort: Pulmonary effort is normal.     Breath sounds: Normal breath sounds. No wheezing, rhonchi or rales.  Musculoskeletal:     Right lower leg: No edema.     Left lower leg: No edema.  Skin:    General: Skin is warm and dry.  Neurological:     Mental Status: She is alert.  Psychiatric:        Speech: Speech normal.        Behavior: Behavior normal.        Thought Content: Thought content normal.

## 2023-11-28 ENCOUNTER — Telehealth: Payer: Self-pay

## 2023-11-28 NOTE — Telephone Encounter (Signed)
 RECEIVED NOTIFICATION REGARDING REFILL REQUEST NEED TO BE SENT  FOR REFILLS TO [facility] FOR PAP FARXIGA (AZ&ME)     PLEASE BE ADVISED [facility] HAS BEEN ADDED TO PT PREFERRED PHARMACIES LIST IN [place].Brittany AasAaron [name]

## 2023-11-29 NOTE — Telephone Encounter (Signed)
 Update from [employer] provider application with signature needed.  Refaxed provider, then will forward to [employer].

## 2023-12-05 NOTE — Telephone Encounter (Signed)
PAP: Patient assistance application for Ozempic has been approved by PAP Companies: [employer] from 12/04/2023 to 10/22/2024. Medication should be delivered to PAP Delivery: Provider's office. For further shipping updates, please [employer] at [phone]. Patient ID is: not provided

## 2023-12-10 DIAGNOSIS — E041 Nontoxic single thyroid nodule: Secondary | ICD-10-CM | POA: Diagnosis not present

## 2023-12-10 DIAGNOSIS — E0590 Thyrotoxicosis, unspecified without thyrotoxic crisis or storm: Secondary | ICD-10-CM | POA: Diagnosis not present

## 2023-12-25 ENCOUNTER — Telehealth: Payer: Self-pay

## 2023-12-25 NOTE — Telephone Encounter (Signed)
 Called pt to see what she needed a call back in reference to no recent documented with pt

## 2023-12-25 NOTE — Telephone Encounter (Signed)
 Copied from CRM [id number]. Topic: General - Other >> Dec 25, 2023  1:08 PM Jon Gills C wrote: Reason for CRM: Patient called in wanting to speak with Eileen Stanford, is requesting a callback

## 2023-12-26 NOTE — Addendum Note (Signed)
 Addended by: Allegra Grana on: 12/26/2023 09:33 PM   Modules accepted: Orders

## 2023-12-26 NOTE — Telephone Encounter (Signed)
 Noted  Please ensure f/u appt scheduled

## 2023-12-26 NOTE — Telephone Encounter (Signed)
 Spoke to pt she wanted to inform pcp that she has stopped taking the Nortriptyline  because of bad dreams

## 2024-01-07 ENCOUNTER — Telehealth: Payer: Self-pay | Admitting: Family

## 2024-01-07 DIAGNOSIS — E0590 Thyrotoxicosis, unspecified without thyrotoxic crisis or storm: Secondary | ICD-10-CM | POA: Diagnosis not present

## 2024-01-07 NOTE — Telephone Encounter (Signed)
 Sch DM f/u 3 months after a1c

## 2024-01-07 NOTE — Telephone Encounter (Signed)
 LVM to call back to    Sch DM f/u 3 months after a1c

## 2024-01-08 NOTE — Telephone Encounter (Signed)
 Spoke to Mrs Rene Kocher Nurse and asked her if she could have pt schedule her 3 mnth DM ,AIC f/up when she does her AWV ON 01/09/2024

## 2024-01-09 ENCOUNTER — Ambulatory Visit (INDEPENDENT_AMBULATORY_CARE_PROVIDER_SITE_OTHER): Payer: Medicare HMO

## 2024-01-09 VITALS — Ht 60.0 in | Wt 141.0 lb

## 2024-01-09 DIAGNOSIS — Z0000 Encounter for general adult medical examination without abnormal findings: Secondary | ICD-10-CM | POA: Diagnosis not present

## 2024-01-09 NOTE — Patient Instructions (Signed)
 Brittany [name] , Thank you for taking time to come for your Medicare Wellness Visit. I appreciate your ongoing commitment to your health goals. Please review the following plan we discussed and let me know if I can assist you in the future.   Referrals/Orders/Follow-Ups/Clinician Recommendations: Please consider having a colonoscopy and updating your shingles vaccines  This is a list of the screening recommended for you and due dates:  Health Maintenance  Topic Date Due   Zoster (Shingles) Vaccine (1 of 2) Never done   Colon Cancer Screening  06/24/2020   COVID-19 Vaccine (3 - 2024-25 season) 06/24/2023   Yearly kidney function blood test for diabetes  01/24/2024   Yearly kidney health urinalysis for diabetes  01/24/2024   Eye exam for diabetics  02/20/2024   Hemoglobin A1C  05/20/2024   Complete foot exam   08/02/2024   Medicare Annual Wellness Visit  01/08/2025   Mammogram  08/05/2025   DTaP/Tdap/Td vaccine (3 - Td or Tdap) 09/22/2032   Pneumonia Vaccine  Completed   Flu Shot  Completed   DEXA scan (bone density measurement)  Completed   Hepatitis C Screening  Completed   HPV Vaccine  Aged Out    Advanced directives: (Declined) Advance directive discussed with you today. Even though you declined this today, please call our office should you change your mind, and we can give you the proper paperwork for you to fill out.  Next Medicare Annual Wellness Visit scheduled for next year: Yes 01/13/25 @ 8:10

## 2024-01-09 NOTE — Progress Notes (Signed)
 Subjective:   Brittany [name] is a 68 y.o. who presents for a Medicare Wellness preventive visit.  Visit Complete: Virtual I connected with  Brittany [name] on 01/09/24 by a audio enabled telemedicine application and verified that I am speaking with the correct person using two identifiers.  Patient Location: Home  Provider Location: Office/Clinic  I discussed the limitations of evaluation and management by telemedicine. The patient expressed understanding and agreed to proceed.  Vital Signs: Because this visit was a virtual/telehealth visit, some criteria may be missing or patient reported. Any vitals not documented were not able to be obtained and vitals that have been documented are patient reported.  VideoDeclined- This patient declined Librarian, academic. Therefore the visit was completed with audio only.  Persons Participating in Visit: Patient.  AWV Questionnaire: No: Patient Medicare AWV questionnaire was not completed prior to this visit.  Cardiac Risk Factors include: advanced age (>13men, >25 women);diabetes mellitus;dyslipidemia;hypertension     Objective:    Today's Vitals   01/09/24 0933  Weight: 141 lb (64 kg)  Height: 5' (1.524 m)   Body mass index is 27.54 kg/m.     01/09/2024    9:45 AM 01/05/2023    9:52 AM 04/13/2016    8:22 AM 12/02/2015    8:38 AM 06/25/2015    7:27 AM 06/16/2015    2:40 PM 05/20/2015    9:13 AM  Advanced Directives  Does Patient Have a Medical Advance Directive? No No No No No No No  Would patient like information on creating a medical advance directive? No - Patient declined No - Patient declined No - patient declined information   No - patient declined information No - patient declined information    Current Medications (verified) Outpatient Encounter Medications as of 01/09/2024  Medication Sig   Accu-Chek FastClix Lancets MISC USE AS DIRECTED   alendronate (FOSAMAX) 70 MG tablet TAKE 1 TABLET (70 MG  TOTAL) BY MOUTH EVERY 7 DAYS WITH FULL GLASS WATER ON EMPTY STOMACH   cholecalciferol (VITAMIN D3) 25 MCG (1000 UNIT) tablet Take 1,000 Units by mouth daily.   dapagliflozin propanediol (FARXIGA) 10 MG TABS tablet TAKE 1 TABLET BY MOUTH EVERY DAY IN THE MORNING BEFORE BREAKFAST   DULoxetine (CYMBALTA) 60 MG capsule Take 1 capsule (60 mg total) by mouth daily.   ezetimibe (ZETIA) 10 MG tablet Take 1 tablet (10 mg total) by mouth daily.   glimepiride (AMARYL) 4 MG tablet TAKE 1 TABLET BY MOUTH DAILY BEFORE BREAKFAST.   methimazole (TAPAZOLE) 5 MG tablet Take 0.5 tablets by mouth daily.   omeprazole (PRILOSEC) 40 MG capsule TAKE 1 CAPSULE BY MOUTH EVERY DAY AS NEEDED   pioglitazone (ACTOS) 15 MG tablet Take 1 tablet (15 mg total) by mouth daily.   Semaglutide, 2 MG/DOSE, 8 MG/3ML SOPN Inject 2 mg as directed once a week.   Evolocumab (REPATHA SURECLICK) 140 MG/ML SOAJ Inject 140 mg into the skin every 14 (fourteen) days. (Patient not taking: Reported on 01/09/2024)   No facility-administered encounter medications on file as of 01/09/2024.    Allergies (verified) Jardiance [empagliflozin], Lisinopril, Atorvastatin, and Victoza [liraglutide]   History: Past Medical History:  Diagnosis Date   Diabetes mellitus without complication (HCC)    Hyperlipidemia    Hypertension    pt states on medication as prevenative   Obesity    Past Surgical History:  Procedure Laterality Date   ABDOMINAL HYSTERECTOMY     COLONOSCOPY WITH PROPOFOL N/A 06/25/2015  Procedure: COLONOSCOPY WITH PROPOFOL;  Surgeon: Midge Minium, MD;  Location: [facility];  Service: Endoscopy;  Laterality: N/A;  Diabetic - oral meds   ELBOW SURGERY Left    HIP SURGERY Right    twice   Family History  Problem Relation Age of Onset   Diabetes Mother    Cancer Father        unknown; '90 % cancer'   Diabetes Sister    Diabetes Brother    Breast cancer Cousin        unknown age at dx.   Thyroid cancer Neg Hx    Social  History   Socioeconomic History   Marital status: Single    Spouse name: Not on file   Number of children: Not on file   Years of education: Not on file   Highest education level: Not on file  Occupational History   Not on file  Tobacco Use   Smoking status: Never   Smokeless tobacco: Never  Vaping Use   Vaping status: Never Used  Substance and Sexual Activity   Alcohol use: Yes    Alcohol/week: 0.0 standard drinks of alcohol    Comment: 1 times a year-wine   Drug use: No   Sexual activity: Not Currently  Other Topics Concern   Not on file  Social History Narrative    from [place] and then moved to [place].    Works at [employer]- retired 10/2021   Youngest son has drug addiction   Plans to keep step daughter's daughter , 3  months      Lives with ex husband, Brittany [name].    2 dogs, 15 chickens.       Enjoys Archivist, fairs, yard Airline pilot.                Social Drivers of Corporate investment banker Strain: Low Risk  (01/09/2024)   Overall Financial Resource Strain (CARDIA)    Difficulty of Paying Living Expenses: Not hard at all  Food Insecurity: No Food Insecurity (01/09/2024)   Hunger Vital Sign    Worried About Running Out of Food in the Last Year: Never true    Ran Out of Food in the Last Year: Never true  Transportation Needs: No Transportation Needs (01/09/2024)   PRAPARE - Administrator, Civil Service (Medical): No    Lack of Transportation (Non-Medical): No  Physical Activity: Inactive (01/09/2024)   Exercise Vital Sign    Days of Exercise per Week: 0 days    Minutes of Exercise per Session: 0 min  Stress: No Stress Concern Present (01/09/2024)   [employer] of Occupational Health - Occupational Stress Questionnaire    Feeling of Stress : Not at all  Social Connections: Moderately Isolated (01/09/2024)   Social Connection and Isolation Panel [NHANES]    Frequency of Communication with Friends and Family: More than three times a week     Frequency of Social Gatherings with Friends and Family: More than three times a week    Attends Religious Services: More than 4 times per year    Active Member of [employer] or Organizations: No    Attends Banker Meetings: Never    Marital Status: Divorced    Tobacco Counseling Counseling given: Not Answered    Clinical Intake:  Pre-visit preparation completed: Yes  Pain : No/denies pain     BMI - recorded: 27.54 Nutritional Status: BMI 25 -29 Overweight Nutritional Risks: None Diabetes: Yes CBG done?:  No Did pt. bring in CBG monitor from home?: No  Lab Results  Component Value Date   HGBA1C 7.0 (A) 11/21/2023   HGBA1C 7.4 (A) 08/03/2023   HGBA1C 7.8 (A) 05/02/2023     How often do you need to have someone help you when you read instructions, pamphlets, or other written materials from your doctor or pharmacy?: 1 - Never  Interpreter Needed?: No  Information entered by :: R. Jeremiah Curci LPN   Activities of Daily Living     01/09/2024    9:34 AM  In your present state of health, do you have any difficulty performing the following activities:  Hearing? 0  Vision? 0  Difficulty concentrating or making decisions? 0  Walking or climbing stairs? 0  Dressing or bathing? 0  Doing errands, shopping? 0  Preparing Food and eating ? N  Using the Toilet? N  In the past six months, have you accidently leaked urine? Y  Comment at times  Do you have problems with loss of bowel control? N  Managing your Medications? N  Managing your Finances? N  Housekeeping or managing your Housekeeping? N    Patient Care Team: Allegra Grana, FNP as PCP - General (Family Medicine) Loree Fee, [facility] (Pharmacist)  Indicate any recent Medical Services you may have received from other than [facility] providers in the past year (date may be approximate).     Assessment:   This is a routine wellness examination for Chanelle.  Hearing/Vision screen Hearing Screening - Comments:: No  issues Vision Screening - Comments:: glasses   Goals Addressed             This Visit's Progress    Patient Stated       Wants to continue to eat good and stay active       Depression Screen     01/09/2024    9:40 AM 11/21/2023   10:00 AM 08/03/2023    8:23 AM 05/02/2023    9:10 AM 01/24/2023    2:52 PM 01/05/2023    9:51 AM 09/22/2022    9:04 AM  PHQ 2/9 Scores  PHQ - 2 Score 0 0 0 0 0 0 0  PHQ- 9 Score 1 0 2 2 2   0    Fall Risk     01/09/2024    9:36 AM 11/21/2023    9:59 AM 08/03/2023    8:23 AM 05/02/2023    9:10 AM 01/24/2023    2:52 PM  Fall Risk   Falls in the past year? 0 0 1 0 0  Number falls in past yr: 0 0 0 0 0  Injury with Fall? 0 0 0 0 0  Risk for fall due to : No Fall Risks No Fall Risks No Fall Risks No Fall Risks No Fall Risks  Follow up Falls prevention discussed;Falls evaluation completed Falls evaluation completed Falls evaluation completed Falls evaluation completed Falls evaluation completed    MEDICARE RISK AT HOME:  Medicare Risk at Home Any stairs in or around the home?: Yes If so, are there any without handrails?: No Home free of loose throw rugs in walkways, pet beds, electrical cords, etc?: Yes Adequate lighting in your home to reduce risk of falls?: Yes Life alert?: No Use of a cane, walker or w/c?: No Grab bars in the bathroom?: Yes Shower chair or bench in shower?: Yes Elevated toilet seat or a handicapped toilet?: No  TIMED UP AND GO:  Was the test performed?  No  Cognitive Function: 6CIT completed        01/09/2024    9:45 AM 01/05/2023    9:54 AM  6CIT Screen  What Year? 0 points 0 points  What month? 0 points 0 points  What time? 0 points 0 points  Count back from 20 0 points 0 points  Months in reverse 0 points 0 points  Repeat phrase 0 points 0 points  Total Score 0 points 0 points    Immunizations Immunization History  Administered Date(s) Administered   Fluad Trivalent(High Dose 65+) 08/03/2023   Influenza  Split Aug 22, 1956, 11/23/1964, 07/20/2018   Influenza, Seasonal, Injecte, Preservative Fre 07/23/2013   Influenza,inj,Quad PF,6+ Mos 07/02/2017, 07/20/2018, 07/25/2019, 08/17/2020, 07/04/2021   Influenza-Unspecified 06/23/2014, 08/07/2016, 07/02/2017   Moderna Sars-Covid-2 Vaccination 06/06/2020, 07/08/2020   PNEUMOCOCCAL CONJUGATE-20 06/23/2022   Pneumococcal Conjugate-13 12/02/2015   Pneumococcal Polysaccharide-23 06/13/2010   Tdap 04/10/2012, 09/22/2022    Screening Tests Health Maintenance  Topic Date Due   Zoster Vaccines- Shingrix (1 of 2) Never done   Colonoscopy  06/24/2020   COVID-19 Vaccine (3 - 2024-25 season) 06/24/2023   Medicare Annual Wellness (AWV)  01/05/2024   Diabetic kidney evaluation - eGFR measurement  01/24/2024   Diabetic kidney evaluation - Urine ACR  01/24/2024   OPHTHALMOLOGY EXAM  02/20/2024   HEMOGLOBIN A1C  05/20/2024   FOOT EXAM  08/02/2024   MAMMOGRAM  08/05/2025   DTaP/Tdap/Td (3 - Td or Tdap) 09/22/2032   Pneumonia Vaccine 64+ Years old  Completed   INFLUENZA VACCINE  Completed   DEXA SCAN  Completed   Hepatitis C Screening  Completed   HPV VACCINES  Aged Out    Health Maintenance  Health Maintenance Due  Topic Date Due   Zoster Vaccines- Shingrix (1 of 2) Never done   Colonoscopy  06/24/2020   COVID-19 Vaccine (3 - 2024-25 season) 06/24/2023   Medicare Annual Wellness (AWV)  01/05/2024   Diabetic kidney evaluation - eGFR measurement  01/24/2024   Diabetic kidney evaluation - Urine ACR  01/24/2024   Health Maintenance Items Addressed: Patient declines colonoscopy which is overdue. Shingles vaccines discussed which patient declines.  Additional Screening:  Vision Screening: Recommended annual ophthalmology exams for early detection of glaucoma and other disorders of the eye.Up to date [facility]. Patient stated that she goes every December. [facility]  Dental Screening: Recommended annual dental exams for proper oral  hygiene  Community Resource Referral / Chronic Care Management: CRR required this visit?  No   CCM required this visit?  No     Plan:     I have personally reviewed and noted the following in the patient's chart:   Medical and social history Use of alcohol, tobacco or illicit drugs  Current medications and supplements including opioid prescriptions. Patient is not currently taking opioid prescriptions. Functional ability and status Nutritional status Physical activity Advanced directives List of other physicians Hospitalizations, surgeries, and ER visits in previous 12 months Vitals Screenings to include cognitive, depression, and falls Referrals and appointments  In addition, I have reviewed and discussed with patient certain preventive protocols, quality metrics, and best practice recommendations. A written personalized care plan for preventive services as well as general preventive health recommendations were provided to patient.     Sydell Axon, LPN   5/62/1308   After Visit Summary: (Declined) Due to this being a telephonic visit, with patients personalized plan was offered to patient but patient Declined AVS at this time   Notes:  Please refer to Routing Comments.

## 2024-01-10 ENCOUNTER — Telehealth: Payer: Self-pay

## 2024-01-10 NOTE — Telephone Encounter (Signed)
 Called pt to inform her that her Ozempic (4 boxes) from pt assistance is here and she can pick up anytime between 9-5 pm M-F

## 2024-01-14 ENCOUNTER — Telehealth: Payer: Self-pay

## 2024-01-14 NOTE — Telephone Encounter (Signed)
 Pt came in and picked up her pt assistance medication Ozempic (4boxes) on 01/14/24 @9  am

## 2024-02-27 ENCOUNTER — Ambulatory Visit: Admitting: Family

## 2024-03-03 ENCOUNTER — Ambulatory Visit: Admitting: Family

## 2024-03-12 ENCOUNTER — Encounter: Payer: Self-pay | Admitting: Family

## 2024-04-22 ENCOUNTER — Telehealth: Payer: Self-pay

## 2024-04-22 NOTE — Telephone Encounter (Signed)
 Called pt and notified her that her pt assistance medication Ozempic  ( 4 boxes)  she can pick up anytime between 9-5 pm M-F

## 2024-04-23 ENCOUNTER — Other Ambulatory Visit: Payer: Self-pay

## 2024-04-23 DIAGNOSIS — E1140 Type 2 diabetes mellitus with diabetic neuropathy, unspecified: Secondary | ICD-10-CM

## 2024-04-23 DIAGNOSIS — E118 Type 2 diabetes mellitus with unspecified complications: Secondary | ICD-10-CM

## 2024-05-12 NOTE — Progress Notes (Signed)
 NOTED

## 2024-05-13 ENCOUNTER — Telehealth: Payer: Self-pay | Admitting: Family

## 2024-05-13 ENCOUNTER — Encounter: Payer: Self-pay | Admitting: Family

## 2024-05-13 ENCOUNTER — Telehealth: Payer: Self-pay

## 2024-05-13 ENCOUNTER — Ambulatory Visit (INDEPENDENT_AMBULATORY_CARE_PROVIDER_SITE_OTHER): Admitting: Family

## 2024-05-13 VITALS — BP 128/80 | HR 83 | Temp 97.9°F | Ht 60.0 in | Wt 140.8 lb

## 2024-05-13 DIAGNOSIS — E118 Type 2 diabetes mellitus with unspecified complications: Secondary | ICD-10-CM | POA: Diagnosis not present

## 2024-05-13 DIAGNOSIS — T466X5A Adverse effect of antihyperlipidemic and antiarteriosclerotic drugs, initial encounter: Secondary | ICD-10-CM

## 2024-05-13 DIAGNOSIS — G720 Drug-induced myopathy: Secondary | ICD-10-CM

## 2024-05-13 DIAGNOSIS — K2100 Gastro-esophageal reflux disease with esophagitis, without bleeding: Secondary | ICD-10-CM

## 2024-05-13 DIAGNOSIS — Z7984 Long term (current) use of oral hypoglycemic drugs: Secondary | ICD-10-CM

## 2024-05-13 DIAGNOSIS — I10 Essential (primary) hypertension: Secondary | ICD-10-CM | POA: Diagnosis not present

## 2024-05-13 DIAGNOSIS — E785 Hyperlipidemia, unspecified: Secondary | ICD-10-CM

## 2024-05-13 DIAGNOSIS — I493 Ventricular premature depolarization: Secondary | ICD-10-CM | POA: Diagnosis not present

## 2024-05-13 DIAGNOSIS — R7309 Other abnormal glucose: Secondary | ICD-10-CM

## 2024-05-13 DIAGNOSIS — Z7985 Long-term (current) use of injectable non-insulin antidiabetic drugs: Secondary | ICD-10-CM | POA: Diagnosis not present

## 2024-05-13 DIAGNOSIS — G6289 Other specified polyneuropathies: Secondary | ICD-10-CM

## 2024-05-13 LAB — COMPREHENSIVE METABOLIC PANEL WITH GFR
ALT: 29 U/L (ref 0–35)
AST: 17 U/L (ref 0–37)
Albumin: 4.1 g/dL (ref 3.5–5.2)
Alkaline Phosphatase: 58 U/L (ref 39–117)
BUN: 19 mg/dL (ref 6–23)
CO2: 30 meq/L (ref 19–32)
Calcium: 8.7 mg/dL (ref 8.4–10.5)
Chloride: 107 meq/L (ref 96–112)
Creatinine, Ser: 0.5 mg/dL (ref 0.40–1.20)
GFR: 97.12 mL/min (ref >60.00)
Glucose, Bld: 127 mg/dL — ABNORMAL HIGH (ref 70–99)
Potassium: 3.9 meq/L (ref 3.5–5.1)
Sodium: 144 meq/L (ref 135–145)
Total Bilirubin: 0.7 mg/dL (ref 0.2–1.2)
Total Protein: 6.4 g/dL (ref 6.0–8.3)

## 2024-05-13 LAB — CBC WITH DIFFERENTIAL/PLATELET
Basophils Absolute: 0 K/uL (ref 0.0–0.1)
Basophils Relative: 0.6 % (ref 0.0–3.0)
Eosinophils Absolute: 0.2 K/uL (ref 0.0–0.7)
Eosinophils Relative: 4.4 % (ref 0.0–5.0)
HCT: 43.7 % (ref 36.0–46.0)
Hemoglobin: 14.5 g/dL (ref 12.0–15.0)
Lymphocytes Relative: 30.6 % (ref 12.0–46.0)
Lymphs Abs: 1.3 K/uL (ref 0.7–4.0)
MCHC: 33.1 g/dL (ref 30.0–36.0)
MCV: 90.8 fl (ref 78.0–100.0)
Monocytes Absolute: 0.5 K/uL (ref 0.1–1.0)
Monocytes Relative: 11 % (ref 3.0–12.0)
Neutro Abs: 2.2 K/uL (ref 1.4–7.7)
Neutrophils Relative %: 53.4 % (ref 43.0–77.0)
Platelets: 206 K/uL (ref 150.0–400.0)
RBC: 4.81 Mil/uL (ref 3.87–5.11)
RDW: 13.9 % (ref 11.5–15.5)
WBC: 4.1 K/uL (ref 4.0–10.5)

## 2024-05-13 LAB — LIPID PANEL
Cholesterol: 213 mg/dL — ABNORMAL HIGH (ref 0–200)
HDL: 54.2 mg/dL (ref >39.00)
LDL Cholesterol: 143 mg/dL — ABNORMAL HIGH (ref 0–99)
NonHDL: 159.29
Total CHOL/HDL Ratio: 4
Triglycerides: 80 mg/dL (ref 0.0–149.0)
VLDL: 16 mg/dL (ref 0.0–40.0)

## 2024-05-13 LAB — POCT GLYCOSYLATED HEMOGLOBIN (HGB A1C): Hemoglobin A1C: 7 % — AB (ref 4.0–5.6)

## 2024-05-13 LAB — IRON,TIBC AND FERRITIN PANEL
%SAT: 29 % (ref 16–45)
Ferritin: 74 ng/mL (ref 16–288)
Iron: 88 ug/dL (ref 45–160)
TIBC: 302 ug/dL (ref 250–450)

## 2024-05-13 LAB — B12 AND FOLATE PANEL
Folate: 16.5 ng/mL (ref >5.9)
Vitamin B-12: 343 pg/mL (ref 211–911)

## 2024-05-13 LAB — MICROALBUMIN / CREATININE URINE RATIO
Creatinine,U: 107.8 mg/dL
Microalb Creat Ratio: 9.6 mg/g (ref 0.0–30.0)
Microalb, Ur: 1 mg/dL (ref 0.0–1.9)

## 2024-05-13 LAB — VITAMIN D 25 HYDROXY (VIT D DEFICIENCY, FRACTURES): VITD: 33.6 ng/mL (ref 30.00–100.00)

## 2024-05-13 LAB — TSH: TSH: 0.67 u[IU]/mL (ref 0.35–5.50)

## 2024-05-13 MED ORDER — DAPAGLIFLOZIN PROPANEDIOL 10 MG PO TABS: ORAL_TABLET | ORAL | 3 refills | Status: AC

## 2024-05-13 MED ORDER — TRAMADOL HCL 50 MG PO TABS: 50.0000 mg | ORAL_TABLET | Freq: Every day | ORAL | 1 refills | Status: AC | PRN

## 2024-05-13 MED ORDER — OMEPRAZOLE 40 MG PO CPDR: DELAYED_RELEASE_CAPSULE | ORAL | 1 refills | Status: DC

## 2024-05-13 NOTE — Telephone Encounter (Signed)
 Spoke to pharmacist they stated that insurance would only pay for 7 tablets and pt would have yo come back in 7 days to get other tablets. Pt has been notified as well

## 2024-05-13 NOTE — Telephone Encounter (Unsigned)
 Copied from CRM [id number]. Topic: Clinical - Medication Question >> May 13, 2024  4:38 PM Gennette ORN wrote: Reason for CRM: Patient wants a callback about the medication she is on.

## 2024-05-13 NOTE — Patient Instructions (Addendum)
 Trial of tramadol   Decrease caffeine  I have ordered a 14 day Zio monitor which will mailed directly to you. The device will include instructions on how to apply.   The Zio 14 day monitor that we use DOES NOT have 24 hour live monitoring. The rhythm of your heart will not be monitored while you are wearing it. Only AFTER you mail the device back in and a cardiologist interprets the data will we know if an underlying cardiac arrhythmia is going on.   There are models that offer 24 hour live monitoring however NOT this one. I wanted to be sure that you were aware of this as certainly didn't want this to be a false sense of security.   If you experience chest pain, shortness of breath, left arm numbness, or sustained, more frequent palpitations , do not wait and call 911 right away. We are in a delicate period of work up regarding palpitations and until we are sure that you do not have an underlying arrhythmia, you must be extremely vigilant and cautious.      ZIO XT- Long Term Monitor Instructions   Your provider has requested you wear a ZIO patch monitor for 14 days.  This is a single patch monitor. [employer] supplies one patch monitor per enrollment. Additional stickers are not available. Please do not apply patch if you will be having a Nuclear Stress Test,  Echocardiogram, Cardiac CT, MRI, or Chest Xray during the period you would be wearing the  monitor. The patch cannot be worn during these tests. You cannot remove and re-apply the  ZIO XT patch monitor.  Your ZIO patch monitor will be mailed 3 day USPS to your address on file. It may take 3-5 days  to receive your monitor after you have been enrolled.  Once you have received your monitor, please review the enclosed instructions. Your monitor  has already been registered assigning a specific monitor serial # to you.   Billing and Patient Assistance Program Information   We have supplied [employer] with any of your insurance information  on file for billing purposes. [employer] offers a sliding scale Patient Assistance Program for patients that do not have  insurance, or whose insurance does not completely cover the cost of the ZIO monitor.  You must apply for the Patient Assistance Program to qualify for this discounted rate.  To apply, please call [employer] at [phone], select option 4, select option 2, ask to apply for  Patient Assistance Program. Meredeth will ask your household income, and how many people  are in your household. They will quote your out-of-pocket cost based on that information.  [employer] will also be able to set up a 85-month, interest-free payment plan if needed.   Applying the monitor   Shave hair from upper left chest.  Hold abrader disc by orange tab. Rub abrader in 40 strokes over the upper left chest as  indicated in your monitor instructions.  Clean area with 4 enclosed alcohol pads. Let dry.  Apply patch as indicated in monitor instructions. Patch will be placed under collarbone on left  side of chest with arrow pointing upward.  Rub patch adhesive wings for 2 minutes. Remove white label marked 1. Remove the white  label marked 2. Rub patch adhesive wings for 2 additional minutes.  While looking in a mirror, press and release button in center of patch. A small green light will  flash 3-4 times. This will be your only indicator that the monitor  has been turned on.  Do not shower for the first 24 hours. You may shower after the first 24 hours.  Press the button if you feel a symptom. You will hear a small click. Record Date, Time and  Symptom in the Patient Logbook.  When you are ready to remove the patch, follow instructions on the last 2 pages of Patient  Logbook. Stick patch monitor onto the last page of Patient Logbook.  Place Patient Logbook in the blue and white box. Use locking tab on box and tape box closed  securely. The blue and white box has prepaid postage on it. Please place it in  the mailbox as  soon as possible. Your physician should have your test results approximately 7 days after the  monitor has been mailed back to [facility].  Call [facility] Customer Care at [phone] if you have questions regarding  your ZIO XT patch monitor. Call them immediately if you see an orange light blinking on your  monitor.  If your monitor falls off in less than 4 days, contact our Monitor department at [phone].  If your monitor becomes loose or falls off after 4 days call [employer] at [phone] for  suggestions on securing your monitor

## 2024-05-13 NOTE — Assessment & Plan Note (Signed)
 Fortunately, she is asymptomatic. EKG with PVCs. No ischemic changes when compared to prior 08/25/2011.  Discussed eliminating [employer] caffeine lemonade drink. Counseled on alarm features. Pending Zio

## 2024-05-13 NOTE — Telephone Encounter (Signed)
 Copied from CRM #[id number]. Topic: Clinical - Prescription Issue >> May 13, 2024  3:44 PM Frederich PARAS wrote: Reason for RMF:Mzjdnw for CRM: pt calling she received a new prescription tramadol . pt wants to know why she only was prescribed 7days of the medicine.

## 2024-05-13 NOTE — Assessment & Plan Note (Signed)
 Lab Results  Component Value Date   HGBA1C 7.0 (A) 05/13/2024   Chronic, stable. Continue Ozempic  2 mg, glimepiride  4 mg every morning, Actos  10 mg daily, Farxiga  10 mg daily.

## 2024-05-13 NOTE — Assessment & Plan Note (Signed)
 She stopped repatha  d/t cost. Referral for patient assistance.

## 2024-05-13 NOTE — Progress Notes (Signed)
 Assessment & Plan:  DM (diabetes mellitus) with complications [facility]) Assessment & Plan: Lab Results  Component Value Date   HGBA1C 7.0 (A) 05/13/2024   Chronic, stable. Continue Ozempic  2 mg, glimepiride  4 mg every morning, Actos  10 mg daily, Farxiga  10 mg daily.    Orders: -     VITAMIN D  25 Hydroxy (Vit-D Deficiency, Fractures) -     CBC with Differential/Platelet -     Comprehensive metabolic panel with GFR -     Lipid panel -     POCT glycosylated hemoglobin (Hb A1C) -     Microalbumin / creatinine urine ratio  Elevated glucose -     TSH -     POCT glycosylated hemoglobin (Hb A1C)  Reflux esophagitis -     Omeprazole ; TAKE ONE CAPSULE BY MOUTH EVERYDAY AS NEEDED  Dispense: 90 capsule; Refill: 1  Other polyneuropathy Assessment & Plan: Well-controlled.  Lyrica , gabapentin not effective for her.  Trial of tramadol .  Discussed sedation and risk of habit forming. Close follow up.   Orders: -     Iron, TIBC and Ferritin Panel -     traMADol  HCl; Take 1 tablet (50 mg total) by mouth daily as needed for up to 5 days.  Dispense: 30 tablet; Refill: 1  PVC (premature ventricular contraction) Assessment & Plan: Fortunately, she is asymptomatic. EKG with PVCs. No ischemic changes when compared to prior 08/25/2011.  Discussed eliminating [employer] caffeine lemonade drink. Counseled on alarm features. Pending Zio  Orders: -     B12 and Folate Panel -     EKG 12-Lead -     LONG TERM MONITOR (3-14 DAYS); Future  Statin myopathy Assessment & Plan: She stopped repatha  d/t cost. Referral for patient assistance.   Orders: -     Lipid panel -     AMB Referral VBCI Care Management  Hyperlipidemia, unspecified hyperlipidemia type -     AMB Referral VBCI Care Management     Return precautions given.   Risks, benefits, and alternatives of the medications and treatment plan prescribed today were discussed, and patient expressed understanding.   Education regarding symptom  management and diagnosis given to patient on AVS either electronically or printed.  Return in about 3 months (around 08/13/2024).  Rollene Northern, FNP  Subjective:    Patient ID: DANAYSHA KIRN, female    DOB: [birth year], 69 y.o.   MRN: [id number]  CC: ALAHNA DUNNE is a 68 y.o. female who presents today for follow up.   HPI: Complains of BL numbness and cramping of feet at night.  She has tried lyrica , gabapentin, vicks , biofreeze. Massage with relief.   No calf pain with walking. Walking improves leg cramps.   No h/o seizure.   No h/o addiction.      She stopped cymbalta  on her own. No increased depression.   She stopped repatha   Denies CP, palpitations, dizziness.   She drinks lemonade with caffeine   Allergies: Jardiance  [empagliflozin ], Lisinopril , Atorvastatin , and Victoza  [liraglutide ] Current Outpatient Medications on File Prior to Visit  Medication Sig Dispense Refill   Accu-Chek FastClix Lancets MISC USE AS DIRECTED 200 each 1   alendronate  (FOSAMAX ) 70 MG tablet TAKE 1 TABLET (70 MG TOTAL) BY MOUTH EVERY 7 DAYS WITH FULL GLASS WATER  ON EMPTY STOMACH 12 tablet 1   cholecalciferol (VITAMIN D3) 25 MCG (1000 UNIT) tablet Take 1,000 Units by mouth daily.     ezetimibe  (ZETIA ) 10 MG tablet Take 1 tablet (10  mg total) by mouth daily. 90 tablet 3   glimepiride  (AMARYL ) 4 MG tablet TAKE 1 TABLET BY MOUTH DAILY BEFORE BREAKFAST. 90 tablet 3   methimazole (TAPAZOLE) 5 MG tablet Take 0.5 tablets by mouth daily.     pioglitazone  (ACTOS ) 15 MG tablet Take 1 tablet (15 mg total) by mouth daily. 90 tablet 3   Semaglutide , 2 MG/DOSE, 8 MG/3ML SOPN Inject 2 mg as directed once a week. 3 mL 3   dapagliflozin  propanediol (FARXIGA ) 10 MG TABS tablet TAKE 1 TABLET BY MOUTH EVERY DAY IN THE MORNING BEFORE BREAKFAST 90 tablet 3   Evolocumab  (REPATHA  SURECLICK) 140 MG/ML SOAJ Inject 140 mg into the skin every 14 (fourteen) days. (Patient not taking: Reported on 05/13/2024) 6 mL 1    No current facility-administered medications on file prior to visit.    Review of Systems  Constitutional:  Negative for chills and fever.  Respiratory:  Negative for cough and shortness of breath.   Cardiovascular:  Negative for chest pain, palpitations and leg swelling.  Gastrointestinal:  Negative for nausea and vomiting.  Neurological:  Positive for numbness (BL feet).      Objective:    BP 128/80   Pulse 83   Temp 97.9 F (36.6 C) (Oral)   Ht 5' (1.524 m)   Wt 140 lb 12.8 oz (63.9 kg)   SpO2 97%   BMI 27.50 kg/m  BP Readings from Last 3 Encounters:  05/13/24 128/80  11/21/23 130/70  08/03/23 136/82   Wt Readings from Last 3 Encounters:  05/13/24 140 lb 12.8 oz (63.9 kg)  01/09/24 141 lb (64 kg)  11/21/23 141 lb (64 kg)    Physical Exam Vitals reviewed.  Constitutional:      Appearance: She is well-developed.  Eyes:     Conjunctiva/sclera: Conjunctivae normal.  Cardiovascular:     Rate and Rhythm: Normal rate. Rhythm irregularly irregular.     Pulses: Normal pulses.     Heart sounds: Normal heart sounds.  Pulmonary:     Effort: Pulmonary effort is normal.     Breath sounds: Normal breath sounds. No wheezing, rhonchi or rales.  Musculoskeletal:     Right lower leg: No edema.     Left lower leg: No edema.  Skin:    General: Skin is warm and dry.  Neurological:     Mental Status: She is alert.  Psychiatric:        Speech: Speech normal.        Behavior: Behavior normal.        Thought Content: Thought content normal.

## 2024-05-13 NOTE — Addendum Note (Signed)
 Addended by: Morgen Ritacco on: 05/13/2024 01:14 PM   Modules accepted: Orders

## 2024-05-13 NOTE — Assessment & Plan Note (Signed)
 Well-controlled.  Lyrica , gabapentin not effective for her.  Trial of tramadol .  Discussed sedation and risk of habit forming. Close follow up.

## 2024-05-16 ENCOUNTER — Other Ambulatory Visit: Payer: Self-pay | Admitting: Emergency Medicine

## 2024-05-16 ENCOUNTER — Ambulatory Visit: Attending: Family

## 2024-05-16 ENCOUNTER — Ambulatory Visit: Payer: Self-pay | Admitting: Family

## 2024-05-16 DIAGNOSIS — I493 Ventricular premature depolarization: Secondary | ICD-10-CM

## 2024-05-16 DIAGNOSIS — E538 Deficiency of other specified B group vitamins: Secondary | ICD-10-CM

## 2024-05-21 DIAGNOSIS — E113293 Type 2 diabetes mellitus with mild nonproliferative diabetic retinopathy without macular edema, bilateral: Secondary | ICD-10-CM | POA: Diagnosis not present

## 2024-05-21 DIAGNOSIS — H02839 Dermatochalasis of unspecified eye, unspecified eyelid: Secondary | ICD-10-CM | POA: Diagnosis not present

## 2024-05-21 DIAGNOSIS — H1131 Conjunctival hemorrhage, right eye: Secondary | ICD-10-CM | POA: Diagnosis not present

## 2024-05-26 NOTE — Telephone Encounter (Signed)
 Copied from CRM [id number]. Topic: General - Other >> May 26, 2024  8:23 AM Avram MATSU wrote: Reason for CRM: pt was told to come in if she having trouble placing her heart monitor on. I spoke with Talisha at the cal and relayed the information to the pt. Pt stated she will not do it after I told her the information. Please call pt back and advise her on what to do next.   [phone] (M)

## 2024-05-27 ENCOUNTER — Telehealth: Payer: Self-pay

## 2024-05-27 ENCOUNTER — Other Ambulatory Visit: Payer: Self-pay | Admitting: Family

## 2024-05-27 DIAGNOSIS — G9009 Other idiopathic peripheral autonomic neuropathy: Secondary | ICD-10-CM | POA: Diagnosis not present

## 2024-05-27 DIAGNOSIS — F339 Major depressive disorder, recurrent, unspecified: Secondary | ICD-10-CM

## 2024-05-27 DIAGNOSIS — M9903 Segmental and somatic dysfunction of lumbar region: Secondary | ICD-10-CM | POA: Diagnosis not present

## 2024-05-27 DIAGNOSIS — M5442 Lumbago with sciatica, left side: Secondary | ICD-10-CM | POA: Diagnosis not present

## 2024-05-27 NOTE — Telephone Encounter (Unsigned)
 Copied from CRM #[id number]. Topic: General - Other >> May 27, 2024 10:03 AM Henretta I wrote: Reason for CRM: Patient needs a call back from nurse jenna to discuss a medication.

## 2024-05-27 NOTE — Telephone Encounter (Signed)
 Myriam Iha P routed this conversation to [facility] (Selected Message) Myriam Iha SQUIBB    05/26/24  9:12 AM Note Copied from CRM #[id number]. Topic: General - Other >> May 26, 2024  8:23 AM Avram MATSU wrote: Reason for CRM: pt was told to come in if she having trouble placing her heart monitor on. I spoke with Laurieanne at the cal and relayed the information to the pt. Pt stated she will not do it after I told her the information. Please call pt back and advise her on what to do next.    [phone] (M)

## 2024-05-28 ENCOUNTER — Ambulatory Visit

## 2024-05-28 NOTE — Telephone Encounter (Signed)
 Patient is returning your call.

## 2024-05-28 NOTE — Telephone Encounter (Signed)
 LVM to call back to speak to [facility]

## 2024-05-28 NOTE — Telephone Encounter (Signed)
 Scheduled pt for nurse visit to assist with Zio monitor

## 2024-05-29 DIAGNOSIS — M5442 Lumbago with sciatica, left side: Secondary | ICD-10-CM | POA: Diagnosis not present

## 2024-05-29 DIAGNOSIS — M9903 Segmental and somatic dysfunction of lumbar region: Secondary | ICD-10-CM | POA: Diagnosis not present

## 2024-05-29 DIAGNOSIS — G9009 Other idiopathic peripheral autonomic neuropathy: Secondary | ICD-10-CM | POA: Diagnosis not present

## 2024-05-30 NOTE — Telephone Encounter (Signed)
 Spoke to pt she is not taking the Cymbalta  or the Tramadol 

## 2024-06-02 DIAGNOSIS — M9903 Segmental and somatic dysfunction of lumbar region: Secondary | ICD-10-CM | POA: Diagnosis not present

## 2024-06-02 DIAGNOSIS — M5442 Lumbago with sciatica, left side: Secondary | ICD-10-CM | POA: Diagnosis not present

## 2024-06-02 DIAGNOSIS — G9009 Other idiopathic peripheral autonomic neuropathy: Secondary | ICD-10-CM | POA: Diagnosis not present

## 2024-06-02 DIAGNOSIS — M25561 Pain in right knee: Secondary | ICD-10-CM | POA: Diagnosis not present

## 2024-06-03 NOTE — Addendum Note (Signed)
 Addended by: CRISTOPHER OLIVIA PARAS on: 06/03/2024 09:34 AM   Modules accepted: Orders

## 2024-06-04 DIAGNOSIS — M9903 Segmental and somatic dysfunction of lumbar region: Secondary | ICD-10-CM | POA: Diagnosis not present

## 2024-06-04 DIAGNOSIS — M5442 Lumbago with sciatica, left side: Secondary | ICD-10-CM | POA: Diagnosis not present

## 2024-06-09 DIAGNOSIS — G9009 Other idiopathic peripheral autonomic neuropathy: Secondary | ICD-10-CM | POA: Diagnosis not present

## 2024-06-09 DIAGNOSIS — M9903 Segmental and somatic dysfunction of lumbar region: Secondary | ICD-10-CM | POA: Diagnosis not present

## 2024-06-09 DIAGNOSIS — M25561 Pain in right knee: Secondary | ICD-10-CM | POA: Diagnosis not present

## 2024-06-09 DIAGNOSIS — M5442 Lumbago with sciatica, left side: Secondary | ICD-10-CM | POA: Diagnosis not present

## 2024-06-10 ENCOUNTER — Telehealth: Payer: Self-pay

## 2024-06-10 NOTE — Progress Notes (Signed)
 Complex Care Management Note  Care Guide Note 06/10/2024 Name: Brittany [name] MRN: [id number] DOB: 19-Sep-1956  Brittany [name] is a 69 y.o. year old female who sees Arnett, Rollene MATSU, FNP for primary care. I reached out to Darice JINNY Anthony by phone today to offer complex care management services.  [name] was given information about Complex Care Management services today including:   The Complex Care Management services include support from the care team which includes your Nurse Care Manager, Clinical Social Worker, or Pharmacist.  The Complex Care Management team is here to help remove barriers to the health concerns and goals most important to you. Complex Care Management services are voluntary, and the patient may decline or stop services at any time by request to their care team member.   Complex Care Management Consent Status: Patient agreed to services and verbal consent obtained.   Follow up plan:  Telephone appointment with complex care management team member scheduled for:  06/16/24 at 9:00 a.m.   Encounter Outcome:  Patient Scheduled  Dreama [facility]  [facility], [facility] VBCI Assistant Direct Dial: [phone]  Fax: [phone]

## 2024-06-13 ENCOUNTER — Other Ambulatory Visit: Payer: Self-pay | Admitting: Family

## 2024-06-13 ENCOUNTER — Telehealth: Payer: Self-pay | Admitting: Family

## 2024-06-13 DIAGNOSIS — E7849 Other hyperlipidemia: Secondary | ICD-10-CM

## 2024-06-13 NOTE — Telephone Encounter (Signed)
 Note say repeat LDL Cholesterol lab order needed

## 2024-06-16 ENCOUNTER — Other Ambulatory Visit (INDEPENDENT_AMBULATORY_CARE_PROVIDER_SITE_OTHER): Admitting: Pharmacist

## 2024-06-16 ENCOUNTER — Encounter: Payer: Self-pay | Admitting: Pharmacist

## 2024-06-16 DIAGNOSIS — E785 Hyperlipidemia, unspecified: Secondary | ICD-10-CM

## 2024-06-16 NOTE — Progress Notes (Signed)
   06/16/2024 Name: LAKASHIA COLLISON MRN: [id number] DOB: [birth year]  Subjective  Chief Complaint  Patient presents with   Hyperlipidemia   Medication Access    Care Team: Primary Care Provider: Dineen Rollene MATSU, FNP  Reason for visit: ?  Brittany [name] is a 68 y.o. female who presents today for a telephone visit with the pharmacist due to medication access concerns regarding their Repatha . ?   Medication Access: ?  Reports that all medications are not affordable.  States she never resumed taking Repatha . Received a letter in the mail from her insurance a while back stating they would cover the first month of Repatha  so she never started it. Did not notify prescribers office.   Current cholesterol medication(s): Ezetimibe  10 mg daily  Medications previously tried: Rosuvastatin  20 mg daily, Rosuvastatin  10 mg daily, Rosuvastatin  5 mg daily - all resulted in bilateral leg myalgias  Atorvastatin  10 mg daily, atorvastatin  20 mg daily - all resulted in bilateral leg myalgias  Ezetimibe  - taking, good compliance PCSK9i: Taking with good control, stopped d/t new insurance requiring PA   Prescription drug coverage: YES Payor: [employer] / Plan: [employer] - [place] / Product Type: *No Product type* / .  Repatha  authorization approved previously through [employer]. Test claim today shows change in insurance:  [employer]: ELIGIBILITY ON FILE:10-24-23 TO 12-21-23 New Rx coverage =  ([employer] H5299/DUAL PARTIAL  Current Patient Assistance: Novo (Ozempic ), [place] (Farxiga )  Assessment and Plan:   1. Medication Access Prior authorization started: CMM Key: [id number]  2. Hyperlipidemia (primary prevention): Uncontrolled. LDL previously significantly improved on Repatha  + ezetimibe  (LDL 34 mg/dL, 02/25/74) though patient has since stopped Repatha  due to change in insurance. Never updated provider regarding insurance change. LDL significantly increased from 34 to 143 mg/dL  (2/77/74) on ezetimibe  alone. Significant history of statin myopathy on several statins at lowest dose.  The 10-year ASCVD risk score (Arnett DK, et al., 2019) is: 13.3% LDL Goal: <70 mg/dL in the setting of primary prevention + DM2 Reviewed current risk with patient. Discussed indication of cholesterol treatment, worsening of recent labs, and ASCVD risk score.  Ideally, Resume Repatha  140 mg sq q2wk Prior auth requested through new insurance plan. Current enrollment in [employer] grant remains active.  BIN [id number], PCN [id number], GRP [id number], ID [id number] Exp: 10/01/24   Future Appointments  Date Time Provider Department Center  06/27/2024  8:45 AM [facility]  01/13/2025  8:10 AM [facility] ANNUAL WELLNESS VISIT [facility]    Manuelita FABIENE Kobs, PharmD Clinical Pharmacist [facility] [phone]

## 2024-06-16 NOTE — Progress Notes (Signed)
 Prior Authorization:   Medication: Repatha  Prior Authorization Submitted: 06/16/24 CMM Code: [id number]   Approved by [employer] NCPDP 2017 Your request has been approved Effective Date: 03/18/2024 Authorization Expiration Date: 06/16/2025  Called [facility]. They ran 3 month supply for $0 before the healthWell grant.  Medication is being ordered and should be available this week.   Discussed with patient.

## 2024-06-18 ENCOUNTER — Other Ambulatory Visit (INDEPENDENT_AMBULATORY_CARE_PROVIDER_SITE_OTHER)

## 2024-06-18 ENCOUNTER — Ambulatory Visit: Payer: Self-pay | Admitting: Family

## 2024-06-18 DIAGNOSIS — E7849 Other hyperlipidemia: Secondary | ICD-10-CM | POA: Diagnosis not present

## 2024-06-18 LAB — LIPID PANEL
Cholesterol: 184 mg/dL (ref 0–200)
HDL: 49.5 mg/dL (ref >39.00)
LDL Cholesterol: 116 mg/dL — ABNORMAL HIGH (ref 0–99)
NonHDL: 134.2
Total CHOL/HDL Ratio: 4
Triglycerides: 91 mg/dL (ref 0.0–149.0)
VLDL: 18.2 mg/dL (ref 0.0–40.0)

## 2024-06-19 DIAGNOSIS — H02839 Dermatochalasis of unspecified eye, unspecified eyelid: Secondary | ICD-10-CM | POA: Diagnosis not present

## 2024-06-19 DIAGNOSIS — H01003 Unspecified blepharitis right eye, unspecified eyelid: Secondary | ICD-10-CM | POA: Diagnosis not present

## 2024-06-19 DIAGNOSIS — E113293 Type 2 diabetes mellitus with mild nonproliferative diabetic retinopathy without macular edema, bilateral: Secondary | ICD-10-CM | POA: Diagnosis not present

## 2024-06-19 DIAGNOSIS — H1131 Conjunctival hemorrhage, right eye: Secondary | ICD-10-CM | POA: Diagnosis not present

## 2024-06-19 DIAGNOSIS — H01006 Unspecified blepharitis left eye, unspecified eyelid: Secondary | ICD-10-CM | POA: Diagnosis not present

## 2024-06-19 LAB — HM DIABETES EYE EXAM

## 2024-06-21 DIAGNOSIS — I493 Ventricular premature depolarization: Secondary | ICD-10-CM

## 2024-06-27 ENCOUNTER — Other Ambulatory Visit

## 2024-07-01 ENCOUNTER — Ambulatory Visit: Payer: Self-pay | Admitting: Family

## 2024-07-02 NOTE — Telephone Encounter (Signed)
-----   Message from Rollene Northern sent at 07/01/2024  8:53 PM EDT ----- Call pt Brittany [name] monitor is reassuring. No sustained arrhythmias or atrial fibrillation.  I do not see any triggers that you identified.  There are no pauses.  There was 1 nonsustained tachycardia lasting 6 beats.  If symptoms persist ,  become more frequent or most certainly if you develop symptoms including dizziness, chest pain, passing out, I would strongly advise a cardiology consult.  Please ensure that you are limiting  caffeine as we discussed. ----- Message ----- From: Cindie Ole DASEN, MD Sent: 06/21/2024   2:58 PM EDT To: Rollene KANDICE Northern, FNP

## 2024-07-02 NOTE — Telephone Encounter (Signed)
 Called patient reviewed all information and repeated back to me. Will call if any questions.  ? ?

## 2024-07-10 DIAGNOSIS — E041 Nontoxic single thyroid nodule: Secondary | ICD-10-CM | POA: Diagnosis not present

## 2024-07-10 DIAGNOSIS — E0590 Thyrotoxicosis, unspecified without thyrotoxic crisis or storm: Secondary | ICD-10-CM | POA: Diagnosis not present

## 2024-07-11 NOTE — Telephone Encounter (Unsigned)
 Copied from CRM #[id number]. Topic: Clinical - Medication Question >> Jul 11, 2024  3:18 PM Drema MATSU wrote: Reason for CRM: Patient is requesting a callback from nurse in regards multiple medications.

## 2024-07-14 ENCOUNTER — Telehealth: Payer: Self-pay

## 2024-07-14 NOTE — Telephone Encounter (Signed)
 Lvm for pt to give office a call back. Wanted to see what she has questions about.

## 2024-07-14 NOTE — Telephone Encounter (Signed)
 Anice Belt, CMA    07/11/24  3:19 PM Unsigned Note Copied from CRM #[id number]. Topic: Clinical - Medication Question >> Jul 11, 2024  3:18 PM Drema MATSU wrote: Reason for CRM: Patient is requesting a callback from nurse in regards multiple medications.

## 2024-07-22 NOTE — Telephone Encounter (Signed)
 Spoke to pt she does not want an appt NOW  she will let us  know if she wants to stop Ozempic 

## 2024-07-22 NOTE — Telephone Encounter (Signed)
 Spoke to pt she has some reservations in regards to the Ozempic , she is going to continue to take for now but will continue to monitor and let us  know if she would like to continue taking it or not

## 2024-07-30 ENCOUNTER — Other Ambulatory Visit: Payer: Self-pay

## 2024-07-30 ENCOUNTER — Emergency Department
Admission: EM | Admit: 2024-07-30 | Discharge: 2024-07-30 | Disposition: A | Discharge status: Home / Self Care | Attending: Emergency Medicine | Admitting: Emergency Medicine

## 2024-07-30 ENCOUNTER — Emergency Department

## 2024-07-30 DIAGNOSIS — W010XXA Fall on same level from slipping, tripping and stumbling without subsequent striking against object, initial encounter: Secondary | ICD-10-CM | POA: Insufficient documentation

## 2024-07-30 DIAGNOSIS — W19XXXA Unspecified fall, initial encounter: Secondary | ICD-10-CM

## 2024-07-30 DIAGNOSIS — E119 Type 2 diabetes mellitus without complications: Secondary | ICD-10-CM | POA: Diagnosis not present

## 2024-07-30 DIAGNOSIS — S42201A Unspecified fracture of upper end of right humerus, initial encounter for closed fracture: Secondary | ICD-10-CM | POA: Diagnosis not present

## 2024-07-30 DIAGNOSIS — I10 Essential (primary) hypertension: Secondary | ICD-10-CM | POA: Insufficient documentation

## 2024-07-30 DIAGNOSIS — S4991XA Unspecified injury of right shoulder and upper arm, initial encounter: Secondary | ICD-10-CM | POA: Diagnosis present

## 2024-07-30 MED ORDER — MORPHINE SULFATE (PF) 4 MG/ML IV SOLN
4.0000 mg | Freq: Once | INTRAVENOUS | Status: AC
  Administered 2024-07-30: 4 mg via INTRAMUSCULAR
  Filled 2024-07-30: qty 1

## 2024-07-30 MED ORDER — OXYCODONE HCL 5 MG PO TABS: 5.0000 mg | ORAL_TABLET | Freq: Three times a day (TID) | ORAL | 0 refills | Status: AC | PRN

## 2024-07-30 NOTE — ED Provider Notes (Signed)
 [facility] Provider Note    Event Date/Time   First MD Initiated Contact with Patient 07/30/24 [phone]     (approximate)   History   Fall   HPI  Brittany [name] is a 68 y.o. female who presents today for evaluation of shoulder pain.  Patient reports that she had a slip and fall on wet grass this morning and landed on her right shoulder.  She denies head strike or LOC.  She reports that she has had pain in her right shoulder ever since.  No numbness or tingling or weakness.  Patient Active Problem List   Diagnosis Date Noted   PVC (premature ventricular contraction) 05/13/2024   Neck muscle spasm 05/02/2023   Osteoporosis 08/29/2022   Bronchitis 09/07/2021   Left hand pain 03/07/2021   Rash 12/06/2020   Screening for breast cancer 12/06/2020   Lymphedema 09/08/2020   Chronic venous insufficiency 09/08/2020   Depression, recurrent 08/25/2020   Thyroid  nodule 08/25/2020   Leg cramping 12/24/2019   Elevated TSH 05/07/2019   Fatigue 12/02/2018   Loss of balance 06/13/2018   Statin myopathy 03/15/2018   Leg pain 07/03/2017   Routine physical examination 07/24/2016   Hyperlipidemia    DM (diabetes mellitus) with complications (HCC) 04/13/2016   Vitamin D  deficiency 04/13/2016   Acquired trigger finger 12/02/2015   Arthritis, degenerative 12/02/2015   Peripheral neuropathy 12/02/2015   GERD (gastroesophageal reflux disease) 12/02/2015   Hx of colonic polyps    Degenerative joint disease of hand 05/21/2015   Colon polyps 05/21/2015   Essential hypertension 05/20/2015   Obesity, Class I, BMI 30-34.9 05/20/2015   Diabetic retinopathy associated with type 2 diabetes mellitus (HCC) 02/23/2011   Allergic rhinitis 11/07/2010          Physical Exam   Triage Vital Signs: ED Triage Vitals [07/30/24 0925]  Encounter Vitals Group     BP 137/77     Girls Systolic BP Percentile      Girls Diastolic BP Percentile      Boys Systolic BP Percentile       Boys Diastolic BP Percentile      Pulse Rate 84     Resp 20     Temp (!) 97.5 F (36.4 C)     Temp Source Oral     SpO2 96 %     Weight 140 lb (63.5 kg)     Height 5' (1.524 m)     Head Circumference      Peak Flow      Pain Score 10     Pain Loc      Pain Education      Exclude from Growth Chart     Most recent vital signs: Vitals:   07/30/24 0925  BP: 137/77  Pulse: 84  Resp: 20  Temp: (!) 97.5 F (36.4 C)  SpO2: 96%    Physical Exam Vitals and nursing note reviewed.  Constitutional:      General: Awake and alert. No acute distress.    Appearance: Normal appearance. The patient is normal weight.  HENT:     Head: Normocephalic and atraumatic.     Mouth: Mucous membranes are moist.  Eyes:     General: PERRL. Normal EOMs        Right eye: No discharge.        Left eye: No discharge.     Conjunctiva/sclera: Conjunctivae normal.  Cardiovascular:     Rate and Rhythm: Normal rate and  regular rhythm.     Pulses: Normal pulses.  Pulmonary:     Effort: Pulmonary effort is normal. No respiratory distress.     Breath sounds: Normal breath sounds.  Abdominal:     Abdomen is soft. There is no abdominal tenderness. No rebound or guarding. No distention. Musculoskeletal:        General: No swelling. Normal range of motion.     Cervical back: Normal range of motion and neck supple.  No cervical spine tenderness, full and normal range of motion of neck. Right shoulder: Deformity and swelling noted to her right shoulder.  Sensation intact over deltoid.  No open wounds.  Normal radial pulse.  Normal grip strength at the hand, no tenderness at the wrist, forearm, or elbow.  Limited range of motion of the shoulder secondary to pain.  Compartment soft compressible throughout. Skin:    General: Skin is warm and dry.     Capillary Refill: Capillary refill takes less than 2 seconds.     Findings: No rash.  Neurological:     Mental Status: The patient is awake and alert.  Neurological: GCS 15 alert and oriented x3 Normal speech, no expressive or receptive aphasia or dysarthria Cranial nerves II through XII intact Normal visual fields 5 out of 5 strength in all 4 extremities with intact sensation throughout No extremity drift Normal finger-to-nose testing, no limb or truncal ataxia      ED Results / Procedures / Treatments   Labs (all labs ordered are listed, but only abnormal results are displayed) Labs Reviewed - No data to display   EKG     RADIOLOGY I independently reviewed and interpreted imaging and agree with radiologists findings.     PROCEDURES:  Critical Care performed:   Procedures   MEDICATIONS ORDERED IN ED: Medications  morphine (PF) 4 MG/ML injection 4 mg (4 mg Intramuscular Given 07/30/24 1052)     IMPRESSION / MDM / ASSESSMENT AND PLAN / ED COURSE  I reviewed the triage vital signs and the nursing notes.   Differential diagnosis includes, but is not limited to, fracture, dislocation, fracture dislocation, contusion, rotator cuff tear.  Patient is awake and alert, hemodynamically stable and afebrile.  She is neurovascularly intact.  There was no head strike, therefore CT head and neck not obtained.  X-ray obtained reveals an acute impacted right humeral surgical neck fracture with apex anterior angulation and a 1 cm displacement compatible with a near 2 part fracture with inferior subluxation of the humeral head relative to the glenoid likely due to joint effusion.  She has normal sensation over her deltoid, normal radial pulse, able to move all fingers.  There are no open wounds.  I discussed this injury with orthopedics on-call, Dr. Onesimo, who recommends sling and follow-up.  Patient was given morphine IM for pain control with good effect.  She was given a small amount of tramadol  to take at home, though advised that this medication is highly addictive and should only be used for breakthrough pain.  She was also  advised that she should not drive, operate heavy machinery, or perform any tasks that require concentration while taking this medication.  She was given the appropriate follow-up information.  Patient understands and agrees with plan.  She was discharged in stable condition.  Case discussed with Dr. Suzanne who agrees with assessment and plan.   Patient's presentation is most consistent with acute complicated illness / injury requiring diagnostic workup.  Clinical Course as of 07/30/24 1433  Wed Jul 30, 2024  1018 Orthopedics paged and HIPAA compliant voicemail left.  Also sent secure chat [JP]  1031 Discussed with Dr. Onesimo with orthopedics who reviewed imaging.  Recommends sling and outpatient follow-up.  No reduction necessary. [JP]    Clinical Course User Index [JP] Matalynn Graff E, PA-C     FINAL CLINICAL IMPRESSION(S) / ED DIAGNOSES   Final diagnoses:  Closed fracture of proximal end of right humerus, unspecified fracture morphology, initial encounter  Fall, initial encounter     Rx / DC Orders   ED Discharge Orders          Ordered    oxyCODONE (ROXICODONE) 5 MG immediate release tablet  Every 8 hours PRN        07/30/24 1051             Note:  This document was prepared using Dragon voice recognition software and may include unintentional dictation errors.   Louie Flenner E, PA-C 07/30/24 1433    Suzanne Kirsch, MD 07/30/24 1620

## 2024-07-30 NOTE — ED Triage Notes (Signed)
 Patient states she slipped on wet grass and fell backwards; complaining of pain to right shoulder and right elbow.

## 2024-07-30 NOTE — Discharge Instructions (Signed)
 Please follow-up with orthopedics.  Keep your arm in the sling.  You may take the pain medicine as prescribed, though remember that this medication is highly addictive and should only be used for breakthrough pain when Tylenol /Motrin do not work first.  Please take Tylenol /ibuprofen per package instructions.  Also remember that you cannot drive, operate heavy machinery, or perform any tasks that require concentration while taking this medication.  Please return for any new, worsening, or changing symptoms or other concerns.

## 2024-08-04 ENCOUNTER — Telehealth: Payer: Self-pay

## 2024-08-04 NOTE — Telephone Encounter (Signed)
 Pt notified of pt assistance meds ready for pick up.  Ozempic  8mg /4ml 3 boxes Lot: [id number]  Exp: 02/20/27

## 2024-08-11 ENCOUNTER — Ambulatory Visit (INDEPENDENT_AMBULATORY_CARE_PROVIDER_SITE_OTHER): Admitting: Physician Assistant

## 2024-08-11 ENCOUNTER — Ambulatory Visit

## 2024-08-11 DIAGNOSIS — S42291A Other displaced fracture of upper end of right humerus, initial encounter for closed fracture: Secondary | ICD-10-CM | POA: Diagnosis not present

## 2024-08-11 DIAGNOSIS — M25511 Pain in right shoulder: Secondary | ICD-10-CM

## 2024-08-11 DIAGNOSIS — S42301D Unspecified fracture of shaft of humerus, right arm, subsequent encounter for fracture with routine healing: Secondary | ICD-10-CM

## 2024-08-11 MED ORDER — OXYCODONE HCL 5 MG PO CAPS: 5.0000 mg | ORAL_CAPSULE | Freq: Four times a day (QID) | ORAL | 0 refills | Status: DC | PRN

## 2024-08-11 MED ORDER — IBUPROFEN 800 MG PO TABS: 800.0000 mg | ORAL_TABLET | Freq: Three times a day (TID) | ORAL | 0 refills | Status: DC | PRN
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
## 2024-08-11 NOTE — Patient Instructions (Addendum)
 [facility] [address], [place] [address] [phone]   Thank you for visiting the office today. We appreciate your trust and allowing us  to help you with your orthopedic needs.  Please do not hesitate to call if you have further questions or concerns following your visit with us . If you experience life-threatening symptoms or it cannot wait until normal office hours, please go to the nearest Emergency Department for immediate evaluation.  Take Oxycodone 5mg  to help with sleep. Take Ibuprofen 800mg  every 8 hours (max three times per day). Take with food to help prevent stomach upset.

## 2024-08-11 NOTE — Telephone Encounter (Signed)
 Pt picked up medication.

## 2024-08-12 ENCOUNTER — Telehealth: Payer: Self-pay | Admitting: Physician Assistant

## 2024-08-12 MED ORDER — OXYCODONE HCL 5 MG PO TABS: 5.0000 mg | ORAL_TABLET | Freq: Four times a day (QID) | ORAL | 0 refills | Status: DC | PRN

## 2024-08-12 MED ORDER — OXYCODONE HCL 5 MG PO CAPS: 5.0000 mg | ORAL_CAPSULE | Freq: Four times a day (QID) | ORAL | 0 refills | Status: DC | PRN

## 2024-08-12 NOTE — Telephone Encounter (Signed)
 Called patient. Patient states she feels ok. Continuing to have shoulder pain. Causing difficulty sleeping. No new injury/trauma. No worsening symptoms. No new concerns.  Gave patient update. Discussed yesterday's appt with Dr. Onesimo. Plan is still to manage conservative/non-operatively. Next f/u scheduled for Thurs. 11/6 (Dr. Onesimo will be in the office). Did discuss that if patient's fracture heals but she has concern regarding ROM or function, then we can discuss a shoulder replacement. Patient understood plan.  Also discussed with patient that [facility] reported oxycodone 5mg  capsules were on backorder/unavailable. Changed rx to oxycodone 5mg  tablets. Medication should be available later today. Sig for one table every 6 hours as needed for severe pain; advised patient to try and use only as necessary for sleeping, but can call [facility] office if pain is uncontrolled/intolerable.

## 2024-08-15 ENCOUNTER — Telehealth: Payer: Self-pay

## 2024-08-15 NOTE — Telephone Encounter (Signed)
 Error- duplicate entry

## 2024-08-15 NOTE — Telephone Encounter (Signed)
 PAP: Patient assistance application for Farxiga  through [employer] ([employer]) has been mailed to pt's home address on file. Provider portion of application will be faxed to provider's office. Patient cond. Approved will give verbal consent to [employer].

## 2024-08-19 ENCOUNTER — Other Ambulatory Visit (HOSPITAL_COMMUNITY): Payer: Self-pay

## 2024-08-19 ENCOUNTER — Other Ambulatory Visit: Payer: Self-pay | Admitting: Family

## 2024-08-19 DIAGNOSIS — E11319 Type 2 diabetes mellitus with unspecified diabetic retinopathy without macular edema: Secondary | ICD-10-CM

## 2024-08-19 MED ORDER — GLIMEPIRIDE 4 MG PO TABS: 4.0000 mg | ORAL_TABLET | Freq: Every day | ORAL | 3 refills | Status: AC

## 2024-08-19 NOTE — Telephone Encounter (Signed)
 PAP: Application for [name] has been submitted to [employer] ([employer]), via fax

## 2024-08-19 NOTE — Telephone Encounter (Signed)
 Copied from CRM #[id number]. Topic: Clinical - Medication Refill >> Aug 19, 2024 10:24 AM Mia F wrote: Medication: glimepiride  (AMARYL ) 4 MG tablet   Has the patient contacted their pharmacy? Yes (Agent: If no, request that the patient contact the pharmacy for the refill. If patient does not wish to contact the pharmacy document the reason why and proceed with request.) (Agent: If yes, when and what did the pharmacy advise?)  This is the patient's preferred pharmacy:   [id number] [place] [place] - [address] [place] [address] Phone: [phone] Fax: [phone]  Is this the correct pharmacy for this prescription? Yes If no, delete pharmacy and type the correct one.   Has the prescription been filled recently? Yes  Is the patient out of the medication? Yes  Has the patient been seen for an appointment in the last year OR does the patient have an upcoming appointment? Yes  Can we respond through MyChart? No  Agent: Please be advised that Rx refills may take up to 3 business days. We ask that you follow-up with your pharmacy.

## 2024-08-25 ENCOUNTER — Other Ambulatory Visit (HOSPITAL_COMMUNITY): Payer: Self-pay

## 2024-08-28 ENCOUNTER — Other Ambulatory Visit: Payer: Self-pay | Admitting: Family

## 2024-08-28 DIAGNOSIS — E785 Hyperlipidemia, unspecified: Secondary | ICD-10-CM

## 2024-09-04 ENCOUNTER — Ambulatory Visit (INDEPENDENT_AMBULATORY_CARE_PROVIDER_SITE_OTHER)

## 2024-09-04 ENCOUNTER — Encounter: Payer: Self-pay | Admitting: Orthopedic Surgery

## 2024-09-04 ENCOUNTER — Ambulatory Visit (INDEPENDENT_AMBULATORY_CARE_PROVIDER_SITE_OTHER): Admitting: Orthopedic Surgery

## 2024-09-04 DIAGNOSIS — S42291D Other displaced fracture of upper end of right humerus, subsequent encounter for fracture with routine healing: Secondary | ICD-10-CM

## 2024-09-04 DIAGNOSIS — S42291A Other displaced fracture of upper end of right humerus, initial encounter for closed fracture: Secondary | ICD-10-CM

## 2024-09-04 NOTE — Progress Notes (Signed)
  Orthopaedic Clinic Return  Assessment: Brittany [name] is a 68 y.o. female with the following: Right proximal humerus fracture   Plan: Mrs. Albritton sustained a right proximal humerus fracture, with minimal displacement overall approximately 5-6 weeks ago.  She is doing quite well at this point.  She is already out of her sling.  We discussed gentle range of motion exercises, starting with passive motion that she can initiate.  If she is interested in therapy at any time, I am happy to provide her with a referral.  She is comfortable advancing her activities on her own.  Focus on motion before strengthening.  Limit lifting at this time.  Medications as needed.  Follow-up in 1 month.  Follow-up: Return in about 4 weeks (around 10/02/2024).   Subjective:  Chief Complaint  Patient presents with   Right proximal humerus fracture    Follow-up 07/30/2024    History of Present Illness: Brittany [name] is a 68 y.o. female who returns to clinic for repeat evaluation of right shoulder pain.  She previously saw [facility] in clinic for a right proximal humerus fracture.  This has been treated without surgery.  She has done well.  She denies pain at this point.  She has been doing some light stretching.  Light activities, including activities of daily living.  Limited lifting.  She is no longer using sling.  Review of Systems: No fevers or chills No numbness or tingling No chest pain No shortness of breath No bowel or bladder dysfunction No GI distress No headaches   Objective: There were no vitals taken for this visit.  Physical Exam:  Alert and oriented.  No acute distress.  Right shoulder without deformity.  She is not using a sling.  She tolerates passive forward flexion to 100 degrees.  Passive abduction to 90 degrees.  Sensation intact over the axillary nerve distribution.  Sensation intact throughout the right hand.  Fingers are warm and well-perfused.  IMAGING: I personally  ordered and reviewed the following images:  X-rays of the right shoulder were obtained in clinic today.  These are compared to prior x-rays.  There has been no significant interval displacement.  There is impaction of the humeral shaft within the humeral head.  tuberosities remain in place.  There is slight posterior translation of the humeral head fragment.  There is some consolidation around the fracture site.  Impression: Stable right proximal humerus fracture  Oneil DELENA Horde, MD 09/04/2024 9:43 AM

## 2024-10-01 ENCOUNTER — Telehealth: Payer: Self-pay

## 2024-10-01 NOTE — Telephone Encounter (Signed)
 Lvm for pt to cb to see if she can come in sooner tomorrow than 10:45. Dr Onesimo had a add on surgery at 79

## 2024-10-02 ENCOUNTER — Ambulatory Visit (INDEPENDENT_AMBULATORY_CARE_PROVIDER_SITE_OTHER): Admitting: Orthopedic Surgery

## 2024-10-02 ENCOUNTER — Ambulatory Visit

## 2024-10-02 ENCOUNTER — Encounter: Payer: Self-pay | Admitting: Orthopedic Surgery

## 2024-10-02 DIAGNOSIS — S42291D Other displaced fracture of upper end of right humerus, subsequent encounter for fracture with routine healing: Secondary | ICD-10-CM

## 2024-10-02 MED ORDER — IBUPROFEN 800 MG PO TABS: 800.0000 mg | ORAL_TABLET | Freq: Three times a day (TID) | ORAL | 0 refills | Status: DC | PRN

## 2024-10-02 NOTE — Progress Notes (Signed)
°  [facility] Return  Assessment: Brittany [name] is a 68 y.o. female with the following: Right proximal humerus fracture   Plan: Brittany [name] sustained a right proximal humerus fracture, with minimal displacement overall approximately 2 months ago.  Pain is better.  She notes some tightness in the shoulder.  Her activity is improving.  She is not interested in surgery.  Provided reassurance.  Recommend progressive increase in activity.  She is comfortable with return as needed.  Ibuprofen  prescription provided.  Exercises provided.  Consider PT.   Follow-up: Return if symptoms worsen or fail to improve.   Subjective:  Chief Complaint  Patient presents with   Right shoulder pain     Proximal humerus fracture     History of Present Illness: Brittany [name] is a 68 y.o. female who returns to clinic for repeat evaluation of right shoulder pain.  She fell and sustained a right shoulder injury.  Pain is better.  She is taking ibuprofen  1-2 times per day.  Gradual increase in motion.  She notes some tightness with motion.   Review of Systems: No fevers or chills No numbness or tingling No chest pain No shortness of breath No bowel or bladder dysfunction No GI distress No headaches   Objective: There were no vitals taken for this visit.  Physical Exam:  Alert and oriented.  No acute distress.  Right shoulder without deformity.  No swelling or bruising.  Passive FF of 120.  She can get her hand to her head.  Fingers WWP.  Sensation intact in axillary nerve distribution, and throughout the hand.   IMAGING: I personally ordered and reviewed the following images:  XR of the right shoulder were obtained today.  No interval displacement.  Shoulder is reduced.  Impaction of humeral head.   Brittany DELENA Horde, MD 10/02/2024 9:16 AM

## 2024-10-02 NOTE — Patient Instructions (Signed)

## 2024-10-20 NOTE — Telephone Encounter (Signed)
 Spoke with Rep at [employer] and patient will need to give digital consent or call to proceed with 2026 renewal application.  That is all that is needed for approval.

## 2024-10-24 ENCOUNTER — Other Ambulatory Visit: Payer: Self-pay | Admitting: Family

## 2024-10-24 DIAGNOSIS — K2100 Gastro-esophageal reflux disease with esophagitis, without bleeding: Secondary | ICD-10-CM

## 2024-10-27 ENCOUNTER — Encounter: Payer: Self-pay | Admitting: Family

## 2024-10-27 ENCOUNTER — Ambulatory Visit: Admitting: Family

## 2024-10-27 ENCOUNTER — Ambulatory Visit: Payer: Self-pay | Admitting: Family

## 2024-10-27 VITALS — BP 120/78 | HR 74 | Temp 98.5°F | Ht 60.0 in | Wt 136.0 lb

## 2024-10-27 DIAGNOSIS — Z7984 Long term (current) use of oral hypoglycemic drugs: Secondary | ICD-10-CM

## 2024-10-27 DIAGNOSIS — E118 Type 2 diabetes mellitus with unspecified complications: Secondary | ICD-10-CM | POA: Diagnosis not present

## 2024-10-27 DIAGNOSIS — Z1231 Encounter for screening mammogram for malignant neoplasm of breast: Secondary | ICD-10-CM

## 2024-10-27 DIAGNOSIS — Z7985 Long-term (current) use of injectable non-insulin antidiabetic drugs: Secondary | ICD-10-CM | POA: Diagnosis not present

## 2024-10-27 DIAGNOSIS — E785 Hyperlipidemia, unspecified: Secondary | ICD-10-CM

## 2024-10-27 DIAGNOSIS — E1140 Type 2 diabetes mellitus with diabetic neuropathy, unspecified: Secondary | ICD-10-CM

## 2024-10-27 DIAGNOSIS — Z780 Asymptomatic menopausal state: Secondary | ICD-10-CM

## 2024-10-27 LAB — COMPREHENSIVE METABOLIC PANEL WITH GFR
ALT: 18 U/L (ref 3–35)
AST: 18 U/L (ref 5–37)
Albumin: 4.4 g/dL (ref 3.5–5.2)
Alkaline Phosphatase: 58 U/L (ref 39–117)
BUN: 20 mg/dL (ref 6–23)
CO2: 28 meq/L (ref 19–32)
Calcium: 9 mg/dL (ref 8.4–10.5)
Chloride: 105 meq/L (ref 96–112)
Creatinine, Ser: 0.45 mg/dL (ref 0.40–1.20)
GFR: 99.3 mL/min
Glucose, Bld: 113 mg/dL — ABNORMAL HIGH (ref 70–99)
Potassium: 4.9 meq/L (ref 3.5–5.1)
Sodium: 139 meq/L (ref 135–145)
Total Bilirubin: 0.7 mg/dL (ref 0.2–1.2)
Total Protein: 6.9 g/dL (ref 6.0–8.3)

## 2024-10-27 LAB — POCT GLYCOSYLATED HEMOGLOBIN (HGB A1C)
HbA1c POC (<> result, manual entry): 6.6 %
HbA1c, POC (controlled diabetic range): 6.6 % (ref 0.0–7.0)
HbA1c, POC (prediabetic range): 6.6 % — AB (ref 5.7–6.4)
Hemoglobin A1C: 6.6 % — AB (ref 4.0–5.6)

## 2024-10-27 MED ORDER — GABAPENTIN 100 MG PO CAPS: 100.0000 mg | ORAL_CAPSULE | Freq: Every day | ORAL | 3 refills | Status: AC

## 2024-10-27 NOTE — Progress Notes (Signed)
 "  Assessment & Plan:  Type 2 diabetes mellitus with diabetic neuropathy, unspecified whether long term insulin use (HCC) -     POCT glycosylated hemoglobin (Hb A1C) -     Comprehensive metabolic panel with GFR -     Gabapentin ; Take 1 capsule (100 mg total) by mouth at bedtime.  Dispense: 30 capsule; Refill: 3  DM (diabetes mellitus) with complications [facility]) Assessment & Plan: With associated neuropathy and she declines foot exam today. She will continue following podiatry.  Agreeable to trial gabapentin  100mg  nightly and titrate Lab Results  Component Value Date   HGBA1C 6.6 (A) 10/27/2024   HGBA1C 6.6 10/27/2024   HGBA1C 6.6 (A) 10/27/2024   HGBA1C 6.6 10/27/2024   Chronic, stable. Continue Ozempic  2 mg, glimepiride  4 mg every morning, Actos  10 mg daily, Farxiga  10 mg daily.     Hyperlipidemia, unspecified hyperlipidemia type Assessment & Plan: Declines resuming repatha . She declines ever resuming. Continue zetia  10mg .  Counseled extensively on increase risk of ASCVD in setting of HLD, DM. She verbalizes understanding and declines retrial of statin of PCSK9    Encounter for screening mammogram for malignant neoplasm of breast -     3D Screening Mammogram, Left and Right; Future  Asymptomatic postmenopausal state -     DG Bone Density; Future     Return precautions given.   Risks, benefits, and alternatives of the medications and treatment plan prescribed today were discussed, and patient expressed understanding.   Education regarding symptom management and diagnosis given to patient on AVS either electronically or printed.  Return in about 6 weeks (around 12/08/2024).  Brittany Northern, FNP  Subjective:    Patient ID: Brittany [name], female    DOB: 1956-03-08, 69 y.o.   MRN: [id number]  CC: Brittany [name] is a 69 y.o. female who presents today for follow up.   HPI: HPI Discussed the use of AI scribe software for clinical note transcription with the patient, who gave  verbal consent to proceed.  History of Present Illness   Brittany [name] is a 69 year old female with hypertension and diabetes who presents for follow-up on cholesterol and diabetes management.   She is currently on Ozempic  2 mg, glimepiride  4 mg every morning, Actos  10 mg daily, and Farxiga  10 mg daily.  She experiences nerve pain, particularly in her left foot, with numbness in the toes. She has previously considered gabapentin  but had concerns with side effects including circulation.    She has also tried capsaicin and Voltaren gel without relief. She experiences sleep disturbances, often falling asleep in her chair and then struggling to sleep through the night, which she attributes to nerve pain.  She has a history of a right humerus fracture from a fall in October.  She is not currently taking Fosamax .    Colonoscopy due.  Last obtained Dr jinny 06/2015. Declines colonoscopy  Follow-up endocrinology 07/10/2024 for subclinical hyperthyroidism; compliant with methimazole    Allergies: Jardiance  [empagliflozin ], Lisinopril , Atorvastatin , and Victoza  [liraglutide ] Medications Ordered Prior to Encounter[1]  Review of Systems  Constitutional:  Negative for chills and fever.  Respiratory:  Negative for cough.   Cardiovascular:  Negative for chest pain and palpitations.  Gastrointestinal:  Negative for nausea and vomiting.  Neurological:  Positive for numbness (left foot).      Objective:    BP 120/78   Pulse 74   Temp 98.5 F (36.9 C)   Ht 5' (1.524 m)   Wt 136 lb (61.7  kg)   SpO2 99%   BMI 26.56 kg/m  BP Readings from Last 3 Encounters:  10/27/24 120/78  07/30/24 137/77  05/13/24 128/80   Wt Readings from Last 3 Encounters:  10/27/24 136 lb (61.7 kg)  07/30/24 140 lb (63.5 kg)  05/13/24 140 lb 12.8 oz (63.9 kg)    Physical Exam Vitals reviewed.  Constitutional:      Appearance: She is well-developed.  Eyes:     Conjunctiva/sclera: Conjunctivae normal.   Cardiovascular:     Rate and Rhythm: Normal rate and regular rhythm.     Pulses: Normal pulses.     Heart sounds: Normal heart sounds.  Pulmonary:     Effort: Pulmonary effort is normal.     Breath sounds: Normal breath sounds. No wheezing, rhonchi or rales.  Skin:    General: Skin is warm and dry.  Neurological:     Mental Status: She is alert.  Psychiatric:        Speech: Speech normal.        Behavior: Behavior normal.        Thought Content: Thought content normal.           [1]  Current Outpatient Medications on File Prior to Visit  Medication Sig Dispense Refill   Accu-Chek FastClix Lancets MISC USE AS DIRECTED 200 each 1   cholecalciferol (VITAMIN D3) 25 MCG (1000 UNIT) tablet Take 1,000 Units by mouth daily.     dapagliflozin  propanediol (FARXIGA ) 10 MG TABS tablet TAKE 1 TABLET BY MOUTH EVERY DAY IN THE MORNING BEFORE BREAKFAST 90 tablet 3   ezetimibe  (ZETIA ) 10 MG tablet TAKE 1 TABLET BY MOUTH EVERY DAY 90 tablet 3   glimepiride  (AMARYL ) 4 MG tablet Take 1 tablet (4 mg total) by mouth daily before breakfast. 90 tablet 3   ibuprofen  (ADVIL ) 800 MG tablet Take 1 tablet (800 mg total) by mouth every 8 (eight) hours as needed. 60 tablet 0   methimazole (TAPAZOLE) 5 MG tablet Take 0.5 tablets by mouth daily.     omeprazole  (PRILOSEC) 40 MG capsule TAKE ONE CAPSULE BY MOUTH EVERYDAY AS NEEDED 90 capsule 1   pioglitazone  (ACTOS ) 15 MG tablet Take 1 tablet (15 mg total) by mouth daily. 90 tablet 3   Semaglutide , 2 MG/DOSE, 8 MG/3ML SOPN Inject 2 mg as directed once a week. 3 mL 3   No current facility-administered medications on file prior to visit.   "

## 2024-10-27 NOTE — Assessment & Plan Note (Addendum)
 Declines resuming repatha . She declines ever resuming. Continue zetia  10mg .  Counseled extensively on increase risk of ASCVD in setting of HLD, DM. She verbalizes understanding and declines retrial of statin of PCSK9

## 2024-10-27 NOTE — Patient Instructions (Addendum)
 Start gabapentin  100mg  at bedtime After 3-5 days, increase to 200mg  gabapentin  at night for numbness in feet.  Continue to follow with podiatry.   Please call  and schedule your 3D mammogram and /or bone density scan as we discussed.   [facility]  ( new location in 2023)  [address], [place], [place] [address]  [place], [place]  [phone]

## 2024-10-27 NOTE — Assessment & Plan Note (Addendum)
 With associated neuropathy and she declines foot exam today. She will continue following podiatry.  Agreeable to trial gabapentin  100mg  nightly and titrate Lab Results  Component Value Date   HGBA1C 6.6 (A) 10/27/2024   HGBA1C 6.6 10/27/2024   HGBA1C 6.6 (A) 10/27/2024   HGBA1C 6.6 10/27/2024   Chronic, stable. Continue Ozempic  2 mg, glimepiride  4 mg every morning, Actos  10 mg daily, Farxiga  10 mg daily.

## 2024-10-29 ENCOUNTER — Telehealth: Payer: Self-pay

## 2024-10-29 NOTE — Telephone Encounter (Signed)
 Copied from CRM [id number]. Topic: General - Other >> Oct 28, 2024  4:34 PM Alfonso HERO wrote: Reason for CRM: patient is returning your call asking for a call back when possible

## 2024-10-29 NOTE — Telephone Encounter (Signed)
 Called Patient with lab results and she did not have any concerns at this time.

## 2024-11-10 ENCOUNTER — Other Ambulatory Visit: Payer: Self-pay | Admitting: Orthopedic Surgery

## 2024-11-27 ENCOUNTER — Ambulatory Visit
Admission: RE | Admit: 2024-11-27 | Discharge: 2024-11-27 | Disposition: A | Source: Ambulatory Visit | Attending: Family

## 2024-11-27 DIAGNOSIS — Z1231 Encounter for screening mammogram for malignant neoplasm of breast: Secondary | ICD-10-CM

## 2024-11-27 DIAGNOSIS — Z780 Asymptomatic menopausal state: Secondary | ICD-10-CM

## 2024-12-10 ENCOUNTER — Ambulatory Visit: Admitting: Family

## 2025-01-13 ENCOUNTER — Ambulatory Visit
